# Patient Record
Sex: Female | Born: 1950 | Race: White | Hispanic: No | Marital: Married | Smoking: Never smoker
Health system: Southern US, Community
[De-identification: ages and names within clinical notes are randomized; demographics above are authoritative.]

## PROBLEM LIST (undated history)

## (undated) DIAGNOSIS — F419 Anxiety disorder, unspecified: Secondary | ICD-10-CM

## (undated) DIAGNOSIS — Z9889 Other specified postprocedural states: Secondary | ICD-10-CM

## (undated) DIAGNOSIS — R112 Nausea with vomiting, unspecified: Secondary | ICD-10-CM

## (undated) DIAGNOSIS — I951 Orthostatic hypotension: Secondary | ICD-10-CM

## (undated) DIAGNOSIS — R0602 Shortness of breath: Secondary | ICD-10-CM

## (undated) DIAGNOSIS — K219 Gastro-esophageal reflux disease without esophagitis: Secondary | ICD-10-CM

## (undated) DIAGNOSIS — F32A Depression, unspecified: Secondary | ICD-10-CM

## (undated) DIAGNOSIS — M795 Residual foreign body in soft tissue: Secondary | ICD-10-CM

## (undated) DIAGNOSIS — K5909 Other constipation: Secondary | ICD-10-CM

## (undated) DIAGNOSIS — N200 Calculus of kidney: Secondary | ICD-10-CM

## (undated) DIAGNOSIS — I452 Bifascicular block: Secondary | ICD-10-CM

## (undated) DIAGNOSIS — R51 Headache: Secondary | ICD-10-CM

## (undated) DIAGNOSIS — E278 Other specified disorders of adrenal gland: Secondary | ICD-10-CM

## (undated) DIAGNOSIS — G8929 Other chronic pain: Secondary | ICD-10-CM

## (undated) DIAGNOSIS — F329 Major depressive disorder, single episode, unspecified: Secondary | ICD-10-CM

## (undated) DIAGNOSIS — E1140 Type 2 diabetes mellitus with diabetic neuropathy, unspecified: Secondary | ICD-10-CM

## (undated) DIAGNOSIS — E1143 Type 2 diabetes mellitus with diabetic autonomic (poly)neuropathy: Secondary | ICD-10-CM

## (undated) DIAGNOSIS — T8859XA Other complications of anesthesia, initial encounter: Secondary | ICD-10-CM

## (undated) DIAGNOSIS — E039 Hypothyroidism, unspecified: Secondary | ICD-10-CM

## (undated) DIAGNOSIS — T4145XA Adverse effect of unspecified anesthetic, initial encounter: Secondary | ICD-10-CM

## (undated) HISTORY — DX: Major depressive disorder, single episode, unspecified: F32.9

## (undated) HISTORY — PX: TUBAL LIGATION: SHX77

## (undated) HISTORY — DX: Anxiety disorder, unspecified: F41.9

## (undated) HISTORY — DX: Gastro-esophageal reflux disease without esophagitis: K21.9

## (undated) HISTORY — PX: SHOULDER SURGERY: SHX246

## (undated) HISTORY — DX: Depression, unspecified: F32.A

## (undated) HISTORY — PX: OTHER SURGICAL HISTORY: SHX169

---

## 1999-06-14 HISTORY — PX: HAND SURGERY: SHX662

## 2004-03-04 ENCOUNTER — Ambulatory Visit (HOSPITAL_COMMUNITY): Admission: RE | Admit: 2004-03-04 | Discharge: 2004-03-04 | Payer: Self-pay | Admitting: Preventative Medicine

## 2004-08-18 ENCOUNTER — Ambulatory Visit: Payer: Self-pay | Admitting: Cardiology

## 2004-09-29 ENCOUNTER — Ambulatory Visit: Payer: Self-pay | Admitting: Cardiology

## 2004-10-05 ENCOUNTER — Ambulatory Visit: Payer: Self-pay | Admitting: Cardiology

## 2004-10-05 ENCOUNTER — Inpatient Hospital Stay (HOSPITAL_BASED_OUTPATIENT_CLINIC_OR_DEPARTMENT_OTHER): Admission: RE | Admit: 2004-10-05 | Discharge: 2004-10-05 | Payer: Self-pay | Admitting: Cardiology

## 2004-10-18 ENCOUNTER — Ambulatory Visit: Payer: Self-pay | Admitting: Cardiology

## 2004-10-25 ENCOUNTER — Ambulatory Visit: Payer: Self-pay | Admitting: Internal Medicine

## 2004-11-04 ENCOUNTER — Ambulatory Visit: Payer: Self-pay | Admitting: Cardiology

## 2005-04-11 ENCOUNTER — Ambulatory Visit: Payer: Self-pay | Admitting: Cardiology

## 2005-04-19 ENCOUNTER — Ambulatory Visit: Payer: Self-pay | Admitting: Internal Medicine

## 2006-04-07 ENCOUNTER — Ambulatory Visit: Payer: Self-pay | Admitting: Cardiology

## 2006-10-03 ENCOUNTER — Ambulatory Visit (HOSPITAL_COMMUNITY): Payer: Self-pay | Admitting: Psychiatry

## 2006-10-24 ENCOUNTER — Ambulatory Visit (HOSPITAL_COMMUNITY): Payer: Self-pay | Admitting: Psychiatry

## 2006-11-30 ENCOUNTER — Ambulatory Visit (HOSPITAL_COMMUNITY): Payer: Self-pay | Admitting: Psychiatry

## 2006-12-14 ENCOUNTER — Ambulatory Visit (HOSPITAL_COMMUNITY): Payer: Self-pay | Admitting: Psychiatry

## 2007-03-13 ENCOUNTER — Ambulatory Visit (HOSPITAL_COMMUNITY): Payer: Self-pay | Admitting: Psychiatry

## 2007-04-17 ENCOUNTER — Emergency Department (HOSPITAL_COMMUNITY): Admission: EM | Admit: 2007-04-17 | Discharge: 2007-04-17 | Payer: Self-pay | Admitting: Emergency Medicine

## 2007-05-08 ENCOUNTER — Ambulatory Visit (HOSPITAL_COMMUNITY): Payer: Self-pay | Admitting: Psychiatry

## 2007-06-05 ENCOUNTER — Ambulatory Visit (HOSPITAL_COMMUNITY): Payer: Self-pay | Admitting: Psychiatry

## 2007-07-17 ENCOUNTER — Ambulatory Visit (HOSPITAL_COMMUNITY): Payer: Self-pay | Admitting: Psychiatry

## 2007-08-21 ENCOUNTER — Ambulatory Visit (HOSPITAL_COMMUNITY): Payer: Self-pay | Admitting: Psychiatry

## 2007-10-18 ENCOUNTER — Ambulatory Visit (HOSPITAL_COMMUNITY): Payer: Self-pay | Admitting: Psychiatry

## 2007-12-25 ENCOUNTER — Ambulatory Visit (HOSPITAL_COMMUNITY): Payer: Self-pay | Admitting: Psychiatry

## 2008-04-10 ENCOUNTER — Ambulatory Visit (HOSPITAL_COMMUNITY): Payer: Self-pay | Admitting: Psychiatry

## 2010-10-29 NOTE — Cardiovascular Report (Signed)
Jenna Maldonado, Jenna Maldonado                  ACCOUNT NO.:  [id number]   MEDICAL RECORD NO.:  [id number]          PATIENT TYPE:  OIB   LOCATION:  6501                         FACILITY:  [facility]   PHYSICIAN:  Charlies Constable, M.D. [facility] DATE OF BIRTH:  [birth year]   DATE OF PROCEDURE:  10/05/2004  DATE OF DISCHARGE:                              CARDIAC CATHETERIZATION   PROCEDURE:  Cardiac catheterization.   CARDIOLOGIST:  Charlies Constable, M.D.   INDICATIONS FOR PROCEDURE:  [name] is 59 years old and has a strong  positive family history for coronary heart disease.  She recently developed  chest pain and was evaluated with a Cardiolite scan in [place], which suggested  anteroapical ischemia.  For this reason she was scheduled for evaluation  with a catheterization in the outpatient laboratory by Dr. Willa Rough.   DESCRIPTION OF PROCEDURE:  The procedure was performed via the right femoral  artery and arterial sheath and 4-French preformed coronary catheters.  A  femoral arterial puncture was performed and Omnipaque contrast was used.  A  distal aortogram was performed to rule out renovascular problems for  hypertension.  The patient tolerated the procedure well and left the  laboratory in satisfactory condition.   RESULTS:  1.  LEFT MAIN CORONARY ARTERY:  The left main coronary artery was free of      significant disease.  2.  LEFT ANTERIOR DESCENDING CORONARY ARTERY:  The left anterior descending      coronary artery gave rise to two diagonal branches and three septal      perforators.  The LAD was diffusely irregular and diffusely diseased      with 40%-50% stenosis in the proximal LAD, 50% and 40% stenosis in the      mid-LAD and 70%-80% stenosis in the distal LAD with diffuse disease at      the very distal LAD.  3.  CIRCUMFLEX CORONARY ARTERY:  The circumflex coronary artery gave rise to      a small marginal branch, a large marginal branch and a posterolateral      branch.  These vessels were  free of significant disease.  4.  RIGHT CORONARY ARTERY:  The right coronary artery is a moderate-sized      vessel which gave rise to a conus branch, a right ventricular branch, a      posterior descending branch and two posterolateral branches.  These      vessels were free of significant disease.   LEFT VENTRICULOGRAM:  The left ventriculogram performed in the RAO  projection showed good wall motion, with no areas of hypokinesis.  The  estimated ejection fraction was 60%.   DISTAL AORTOGRAM:  The distal aortogram was performed which showed patent  __________ and no significant aortoiliac obstruction.   PRESSURES:  Aortic pressure:  128/75 with a mean of 98.  Left ventricular pressure:  128/5.   CONCLUSION:  Coronary artery disease with a 50% proximal, 50% mid and 70%-  80% distal stenosis in the left anterior descending coronary artery, no  significant obstruction in the circumflex and right coronary  artery, and  normal left ventricular function.   RECOMMENDATIONS:  The patient has quite a bit of disease in the LAD.  Most  of the disease is distal and not favorable for a percutaneous coronary  intervention.  I think the disease is potentially enough that it could cause  ischemia and could cause an abnormal Cardiolite scan.  I would recommend  medical therapy.      BB/MEDQ  D:  10/05/2004  T:  10/05/2004  Job:  [id number]   cc:   Willa Rough, M.D.   Kirstie Peri, MD  [address]  [place] [address]  Fax: [phone]   Cardiopulmonary Lab  -  [facility]

## 2013-01-01 ENCOUNTER — Ambulatory Visit: Payer: 59 | Admitting: Diagnostic Neuroimaging

## 2013-01-02 ENCOUNTER — Ambulatory Visit (INDEPENDENT_AMBULATORY_CARE_PROVIDER_SITE_OTHER): Payer: Medicare Other | Admitting: Diagnostic Neuroimaging

## 2013-01-02 ENCOUNTER — Other Ambulatory Visit: Payer: Self-pay | Admitting: Diagnostic Neuroimaging

## 2013-01-02 ENCOUNTER — Encounter: Payer: Self-pay | Admitting: Diagnostic Neuroimaging

## 2013-01-02 VITALS — BP 138/65 | HR 67 | Temp 98.3°F | Ht 62.0 in | Wt 203.0 lb

## 2013-01-02 DIAGNOSIS — R6889 Other general symptoms and signs: Secondary | ICD-10-CM

## 2013-01-02 DIAGNOSIS — R51 Headache: Secondary | ICD-10-CM

## 2013-01-02 DIAGNOSIS — E1142 Type 2 diabetes mellitus with diabetic polyneuropathy: Secondary | ICD-10-CM

## 2013-01-02 DIAGNOSIS — E1149 Type 2 diabetes mellitus with other diabetic neurological complication: Secondary | ICD-10-CM

## 2013-01-02 DIAGNOSIS — E1140 Type 2 diabetes mellitus with diabetic neuropathy, unspecified: Secondary | ICD-10-CM | POA: Insufficient documentation

## 2013-01-02 MED ORDER — GABAPENTIN 300 MG PO CAPS: 300.0000 mg | ORAL_CAPSULE | Freq: Every day | ORAL | Status: DC

## 2013-01-02 NOTE — Patient Instructions (Signed)
I will check labs.  Start gabapentin 300mg  at bedtime.

## 2013-01-02 NOTE — Progress Notes (Signed)
[facility]  PATIENT: Jenna Maldonado DOB: [birth year]  REFERRING CLINICIAN: Sherryll Burger HISTORY FROM: patient REASON FOR VISIT: new consult   HISTORICAL  CHIEF COMPLAINT:  Chief Complaint  Patient presents with  . Neurologic Problem    Np#6    HISTORY OF PRESENT ILLNESS:  62 year old right-handed female with diabetes, depression, here for evaluation of right-sided facial pain.  For past one year patient has right maxillary, periorbital burning pain, sometimes dull and nagging. Sometimes she has numbness in her whole head. Sometimes she has humming/buzzing sensation in her ears. She does to wake up in the morning with nausea and more pain which subsides throughout the day. She's not tried any medications for this. No over-the-counter medications.  Patient had MRI of the trigeminal nerve protocol, which showed an incidental right parotid lymphatic structure but no definite trigeminal nerve pathology.  REVIEW OF SYSTEMS: Full 14 system review of systems performed and notable only for weight gain hearing loss ringing in ears rash moles itching urination problems aching muscle cramps joint swelling joint pain feeling hot easy bruising numbness weakness difficulty swallowing depression sleepiness restless legs.  ALLERGIES: Allergies  Allergen Reactions  . Codeine     HOME MEDICATIONS: No outpatient prescriptions prior to visit.   No facility-administered medications prior to visit.    PAST MEDICAL HISTORY: Past Medical History  Diagnosis Date  . Diabetes mellitus without complication   . Depression     PAST SURGICAL HISTORY: Past Surgical History  Procedure Laterality Date  . Shoulder surgery Left   . Tubal ligation    . Hand surgery Bilateral 2001  . Eye surgrey Bilateral     4-5 times     FAMILY HISTORY: Family History  Problem Relation Age of Onset  . Cancer Mother     SOCIAL HISTORY:  History   Social History  . Marital Status: Married   Spouse Name: N/A    Number of Children: 2  . Years of Education: 12   Occupational History  . N/A    Social History Main Topics  . Smoking status: Never Smoker   . Smokeless tobacco: Not on file  . Alcohol Use: Not on file  . Drug Use: Not on file  . Sexually Active: Not on file   Other Topics Concern  . Not on file   Social History Narrative  . No narrative on file     PHYSICAL EXAM  Filed Vitals:   01/02/13 0950  BP: 138/65  Pulse: 67  Temp: 98.3 F (36.8 C)  TempSrc: Oral  Height: 5\' 2"  (1.575 m)  Weight: 203 lb (92.08 kg)    Not recorded    Body mass index is 37.12 kg/(m^2).  GENERAL EXAM: Patient is in no distress  CARDIOVASCULAR: Regular rate and rhythm, no murmurs, no carotid bruits  NEUROLOGIC: MENTAL STATUS: awake, alert, language fluent, comprehension intact, naming intact CRANIAL NERVE: no papilledema on fundoscopic exam, pupils equal and reactive to light, visual fields full to confrontation, extraocular muscles intact, no nystagmus, facial sensation and strength symmetric, uvula midline, shoulder shrug symmetric, tongue midline. MOTOR: normal bulk and tone, full strength in the BUE, BLE SENSORY: DECR PP IN TOES. TRACE VIB AT TOES. COORDINATION: finger-nose-finger, fine finger movements normal REFLEXES: BUE TRACE, KNEES TRACE, ANKLES 0. GAIT/STATION: narrow based gait; DIFF WITH TANDEM. romberg is negative   DIAGNOSTIC DATA (LABS, IMAGING, TESTING) - I reviewed patient records, labs, notes, testing and imaging myself where available.  No results found for this  basename: WBC, HGB, HCT, MCV, PLT   No results found for this basename: na, k, cl, co2, glucose, bun, creatinine, calcium, prot, albumin, ast, alt, alkphos, bilitot, gfrnonaa, gfraa   No results found for this basename: CHOL, HDL, LDLCALC, LDLDIRECT, TRIG, CHOLHDL   No results found for this basename: HGBA1C   No results found for this basename: VITAMINB12   No results found for  this basename: TSH    11/15/12 MRI TRIGEMINAL - Basilar artery is midline without compression of the cisternal aspect of the fifth cranial nerve. Symmetric normal appearance of the course of the fifth cranial nerve without mass or abnormal enhancement identified. Along the inferior medial aspect of the right parotid gland, there is a nonspecific T2 hyperintense non-enhancing 1.6 x 1.1 x 1.4 cm lesion. Etiology of this findings indeterminate. 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    ASSESSMENT AND PLAN  62 y.o. year old female  has a past medical history of Diabetes mellitus without complication and Depression. here with atypical right facial pain, could be related to idiopathic trigeminal neuralgia, diabetes, migraine phenomenon, temporal arteritis. MRI unremarkable re: her symptoms.  Plan: 1. Labs 2. Gabapentin 300mg  qhs   Orders Placed This Encounter  Procedures  . Sedimentation Rate  . C-reactive Protein  . TSH  . Vitamin B12     Suanne Marker, MD 01/02/2013, 10:28 AM Certified in Neurology, Neurophysiology and Neuroimaging  [facility] [address] [place], [place] [address] [phone]

## 2013-01-03 LAB — C-REACTIVE PROTEIN: CRP: 2 mg/L (ref 0.0–4.9)

## 2013-01-03 LAB — TSH: TSH: 5.65 u[IU]/mL — ABNORMAL HIGH (ref 0.450–4.500)

## 2013-01-07 ENCOUNTER — Telehealth: Payer: Self-pay | Admitting: *Deleted

## 2013-01-07 NOTE — Telephone Encounter (Signed)
Returned pt's call-back - left voice message requesting a call-back. 2nd attempt.

## 2013-01-07 NOTE — Telephone Encounter (Signed)
Message copied by Avie Echevaria on Mon Jan 07, 2013  2:09 PM ------      Message from: Joycelyn Schmid      Created: Mon Jan 07, 2013  1:37 PM       pls call pt with results. Elevated TSH needs PCP follow up.            -VRP            ----- Message -----         From: [facility] Lab Results In Interface         Sent: 01/03/2013   4:39 PM           To: Suanne Marker, MD                   ------

## 2013-01-07 NOTE — Telephone Encounter (Signed)
Voice message left requesting a call-back. 

## 2013-01-08 NOTE — Telephone Encounter (Signed)
Spoke with patient and relayed results of blood work; I also relayed Dr [employer] advice to talk to her PCP regarding her TSH level.  Patient understood and had no questions.

## 2013-01-08 NOTE — Progress Notes (Signed)
Quick Note:  Spoke with patient and relayed results of blood work and Dr [employer] advice to follow-up with her PCP. Patient understood and had no questions.  ______

## 2013-04-09 ENCOUNTER — Ambulatory Visit: Payer: Medicare Other | Admitting: Diagnostic Neuroimaging

## 2013-04-18 ENCOUNTER — Ambulatory Visit (INDEPENDENT_AMBULATORY_CARE_PROVIDER_SITE_OTHER): Payer: Medicare Other | Admitting: Diagnostic Neuroimaging

## 2013-04-18 ENCOUNTER — Encounter: Payer: Self-pay | Admitting: Diagnostic Neuroimaging

## 2013-04-18 VITALS — BP 134/71 | HR 87 | Temp 98.3°F | Ht 63.0 in | Wt 202.5 lb

## 2013-04-18 DIAGNOSIS — R51 Headache: Secondary | ICD-10-CM

## 2013-04-18 DIAGNOSIS — E1140 Type 2 diabetes mellitus with diabetic neuropathy, unspecified: Secondary | ICD-10-CM

## 2013-04-18 DIAGNOSIS — R6889 Other general symptoms and signs: Secondary | ICD-10-CM

## 2013-04-18 DIAGNOSIS — E1149 Type 2 diabetes mellitus with other diabetic neurological complication: Secondary | ICD-10-CM

## 2013-04-18 DIAGNOSIS — E1142 Type 2 diabetes mellitus with diabetic polyneuropathy: Secondary | ICD-10-CM

## 2013-04-18 MED ORDER — GABAPENTIN 300 MG PO CAPS: 300.0000 mg | ORAL_CAPSULE | Freq: Three times a day (TID) | ORAL | Status: DC

## 2013-04-18 NOTE — Progress Notes (Signed)
[facility]  PATIENT: Jenna Maldonado DOB: 01/14/1951  REFERRING CLINICIAN: Sherryll Burger HISTORY FROM: patient REASON FOR VISIT: new consult   HISTORICAL  CHIEF COMPLAINT:  Chief Complaint  Patient presents with  . Follow-up    R. Facial pain, diabetic neuropathy    HISTORY OF PRESENT ILLNESS:   UPDATE 04/18/13: Since last visit, on gabapentin 300mg  qhs, with mild benefit, and no side effects. Still with right temporal, right maxillary pressure / achy pain.  PRIOR HPI (01/02/13): 63 year old right-handed female with diabetes, depression, here for evaluation of right-sided facial pain.  For past one year patient has right maxillary, periorbital burning pain, sometimes dull and nagging. Sometimes she has numbness in her whole head. Sometimes she has humming/buzzing sensation in her ears. She does to wake up in the morning with nausea and more pain which subsides throughout the day. She's not tried any medications for this. No over-the-counter medications.  Patient had MRI of the trigeminal nerve protocol, which showed an incidental right parotid lymphatic structure but no definite trigeminal nerve pathology.  REVIEW OF SYSTEMS: Full 14 system review of systems performed and notable only for fatigue ringing in ears trouble swallowing snoring shortness of breath eye pain feeling hot joint pain joint swelling frequent infection runny nose racing thoughts depression dizziness difficulty swallowing weakness headache memory loss chest pain swelling in legs    ALLERGIES: Allergies  Allergen Reactions  . Codeine     HOME MEDICATIONS: Outpatient Prescriptions Prior to Visit  Medication Sig Dispense Refill  . Enoxaparin Sodium (LOVENOX IJ) Inject 50 Units as directed daily.      [name] FLUoxetine (PROZAC) 40 MG capsule       . glimepiride (AMARYL) 4 MG tablet Take 4 mg by mouth 2 (two) times daily.      [name] levothyroxine (SYNTHROID, LEVOTHROID) 25 MCG tablet       . gabapentin  (NEURONTIN) 300 MG capsule Take 1 capsule (300 mg total) by mouth at bedtime.  30 capsule  6  . omeprazole (PRILOSEC) 20 MG capsule        No facility-administered medications prior to visit.    PAST MEDICAL HISTORY: Past Medical History  Diagnosis Date  . Diabetes mellitus without complication   . Depression     PAST SURGICAL HISTORY: Past Surgical History  Procedure Laterality Date  . Shoulder surgery Left   . Tubal ligation    . Hand surgery Bilateral 2001  . Eye surgrey Bilateral     4-5 times     FAMILY HISTORY: Family History  Problem Relation Age of Onset  . Cancer Mother   . Suicidality Father     GSW    SOCIAL HISTORY:  History   Social History  . Marital Status: Married    Spouse Name: Chanetta Marshall    Number of Children: 2  . Years of Education: 12   Occupational History  . N/A    Social History Main Topics  . Smoking status: Never Smoker   . Smokeless tobacco: Never Used  . Alcohol Use: No  . Drug Use: No  . Sexual Activity: Not on file   Other Topics Concern  . Not on file   Social History Narrative   Patient lives at home alone.   Caffeine Use: occasionally     PHYSICAL EXAM  Filed Vitals:   04/18/13 1527  BP: 134/71  Pulse: 87  Temp: 98.3 F (36.8 C)  TempSrc: Oral  Height: 5\' 3"  (1.6 m)  Weight:  202 lb 8 oz (91.853 kg)    Not recorded    Body mass index is 35.88 kg/(m^2).  GENERAL EXAM: Patient is in no distress  CARDIOVASCULAR: Regular rate and rhythm, no murmurs, no carotid bruits  NEUROLOGIC: MENTAL STATUS: awake, alert, language fluent, comprehension intact, naming intact CRANIAL NERVE: pupils equal and reactive to light, visual fields full to confrontation, extraocular muscles intact, no nystagmus, facial sensation and strength symmetric, uvula midline, shoulder shrug symmetric, tongue midline. MOTOR: normal bulk and tone, full strength in the BUE, BLE SENSORY: DECR PP IN TOES. TRACE VIB AT TOES. COORDINATION:  finger-nose-finger, fine finger movements normal REFLEXES: BUE TRACE, KNEES TRACE, ANKLES 0. GAIT/STATION: narrow based gait; DIFF WITH TANDEM. romberg is negative   DIAGNOSTIC DATA (LABS, IMAGING, TESTING) - I reviewed patient records, labs, notes, testing and imaging myself where available.  No results found for this basename: WBC,  HGB,  HCT,  MCV,  PLT   No results found for this basename: na,  k,  cl,  co2,  glucose,  bun,  creatinine,  calcium,  prot,  albumin,  ast,  alt,  alkphos,  bilitot,  gfrnonaa,  gfraa   No results found for this basename: CHOL,  HDL,  LDLCALC,  LDLDIRECT,  TRIG,  CHOLHDL   No results found for this basename: HGBA1C   Lab Results  Component Value Date   VITAMINB12 316 01/02/2013   Lab Results  Component Value Date   TSH 5.650* 01/02/2013   Lab Results  Component Value Date   CRP 2.0 01/02/2013   Lab Results  Component Value Date   ESRSEDRATE 7 01/02/2013    11/15/12 MRI TRIGEMINAL - Basilar artery is midline without compression of the cisternal aspect of the fifth cranial nerve. Symmetric normal appearance of the course of the fifth cranial nerve without mass or abnormal enhancement identified. Along the inferior medial aspect of the right parotid gland, there is a nonspecific T2 hyperintense non-enhancing 1.6 x 1.1 x 1.4 cm lesion. Etiology of this findings indeterminate. 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   ASSESSMENT AND PLAN  62 y.o. year old female  has a past medical history of Diabetes mellitus without complication and Depression. here with atypical right facial pain, could be related to idiopathic trigeminal neuralgia, diabetes, migraine phenomenon. MRI unremarkable re: her symptoms.  Plan: 1. Increase gabapentin to 300mg  BID then TID.  Return in about 3 months (around 07/19/2013) for with Edison Nasuti, MD 04/18/2013, 4:03 PM Certified in Neurology,  Neurophysiology and Neuroimaging  [facility] [place] [facility] [address], [address] [place], [place] [address] [phone]

## 2013-04-18 NOTE — Patient Instructions (Signed)
Increase gabapentin to 300mg  twice a day x 2 weeks, then up to three times per day.  Monitor for leg swelling or dizziness.

## 2013-07-19 ENCOUNTER — Ambulatory Visit (INDEPENDENT_AMBULATORY_CARE_PROVIDER_SITE_OTHER): Payer: Medicare Other | Admitting: Nurse Practitioner

## 2013-07-19 ENCOUNTER — Encounter: Payer: Self-pay | Admitting: Nurse Practitioner

## 2013-07-19 ENCOUNTER — Encounter (INDEPENDENT_AMBULATORY_CARE_PROVIDER_SITE_OTHER): Payer: Self-pay

## 2013-07-19 VITALS — BP 108/71 | HR 72 | Ht 62.0 in | Wt 202.0 lb

## 2013-07-19 DIAGNOSIS — E1149 Type 2 diabetes mellitus with other diabetic neurological complication: Secondary | ICD-10-CM

## 2013-07-19 DIAGNOSIS — E1142 Type 2 diabetes mellitus with diabetic polyneuropathy: Secondary | ICD-10-CM

## 2013-07-19 DIAGNOSIS — E1140 Type 2 diabetes mellitus with diabetic neuropathy, unspecified: Secondary | ICD-10-CM

## 2013-07-19 DIAGNOSIS — R51 Headache: Secondary | ICD-10-CM

## 2013-07-19 DIAGNOSIS — R519 Headache, unspecified: Secondary | ICD-10-CM

## 2013-07-19 MED ORDER — CARBAMAZEPINE ER 100 MG PO CP12: 100.0000 mg | ORAL_CAPSULE | Freq: Two times a day (BID) | ORAL | Status: DC

## 2013-07-19 NOTE — Patient Instructions (Signed)
Continue Gabapentin 300 mg tid as needed for nerve pain.  Trial of Carbamazepine 100 mg every 12 hours for facial pain.  Follow up in 2-3 months.  Carbamazepine extended-release capsules What is this medicine? CARBAMAZEPINE (kar ba MAZ e peen) is used to control seizures caused by certain types of epilepsy. This medicine is also used to treat nerve related pain. It is not for common aches and pains. This medicine may be used for other purposes; ask your health care provider or pharmacist if you have questions. COMMON BRAND NAME(S): Carbatrol What should I tell my health care provider before I take this medicine? They need to know if you have any of these conditions: -Asian ancestry -bone marrow disease -glaucoma -heart disease or irregular heartbeat -kidney disease -liver disease -low blood counts, like low white cell, platelet, or red cell counts -porphyria -psychotic disorders -suicidal thoughts, plans, or attempt; a previous suicide attempt by you or a family member -an unusual or allergic reaction to carbamazepine, tricyclic antidepressants, phenytoin, phenobarbital or other medicines, foods, dyes, or preservatives -pregnant or trying to get pregnant -breast-feeding How should I use this medicine? Take this medicine by mouth with a glass of water. Follow the directions on the prescription label. Do not cut, crush or chew this medicine. Take this medicine with or without food. The capsules can be opened and the beads sprinkled over food such as applesauce or other similar food product. Take your doses at regular intervals. Do not take your medicine more often than directed. Do not stop taking except on your doctor's advice. A special MedGuide will be given to you by the pharmacist with each prescription and refill. Be sure to read this information carefully each time. Talk to your pediatrician regarding the use of this medicine in children. While this drug may be prescribed for  selected conditions, precautions do apply. Overdosage: If you think you have taken too much of this medicine contact a poison control center or emergency room at once. NOTE: This medicine is only for you. Do not share this medicine with others. What if I miss a dose? If you miss a dose, take it as soon as you can. If it is almost time for your next dose, take only that dose. Do not take double or extra doses. What may interact with this medicine? Do not take this medicine with any of the following medications: -delavirdine -MAOIs like Carbex, Eldepryl, Marplan, Nardil, and Parnate -nefazodone -oxcarbazepine This medicine may also interact with the following medications: -acetaminophen -acetazolamide -barbiturate medicines for inducing sleep or treating seizures, like phenobarbital -certain antibiotics like clarithromycin, erythromycin or troleandomycin -cimetidine -cyclosporine -danazol -dicumarol -doxycycline -female hormones, including estrogens and birth control pills -grapefruit juice -isoniazid, INH -levothyroxine and other thyroid hormones -lithium and other medicines to treat mood problems or psychotic disturbances -loratadine -medicines for angina or high blood pressure -medicines for cancer -medicines for depression or anxiety -medicines for sleep -medicines to treat fungal infections, like fluconazole, itraconazole or ketoconazole -medicines used to treat HIV infection or AIDS -methadone -niacinamide -praziquantel -propoxyphene -rifampin or rifabutin -seizure or epilepsy medicine -steroid medicines such as prednisone or cortisone -theophylline -tramadol -warfarin This list may not describe all possible interactions. Give your health care provider a list of all the medicines, herbs, non-prescription drugs, or dietary supplements you use. Also tell them if you smoke, drink alcohol, or use illegal drugs. Some items may interact with your medicine. What should I  watch for while using this medicine? Visit your doctor or  health care professional for a regular check on your progress. Do not change brands or dosage forms of this medicine without discussing the change with your doctor or health care professional. If you are taking this medicine for epilepsy (seizures) do not stop taking it suddenly. This increases the risk of seizures. Wear a Probation officer or necklace. Carry an identification card with information about your condition, medications, and doctor or health care professional. Dennis Bast may get drowsy, dizzy, or have blurred vision. Do not drive, use machinery, or do anything that needs mental alertness until you know how this medicine affects you. To reduce dizzy or fainting spells, do not sit or stand up quickly, especially if you are an older patient. Alcohol can increase drowsiness and dizziness. Avoid alcoholic drinks. Birth control pills may not work properly while you are taking this medicine. Talk to your doctor about using an extra method of birth control. This medicine can make you more sensitive to the sun. Keep out of the sun. If you cannot avoid being in the sun, wear protective clothing and use sunscreen. Do not use sun lamps or tanning beds/booths. The use of this medicine may increase the chance of suicidal thoughts or actions. Pay special attention to how you are responding while on this medicine. Any worsening of mood, or thoughts of suicide or dying should be reported to your health care professional right away. Women who become pregnant while using this medicine may enroll in the [address] Pregnancy Registry by calling [phone]. This registry collects information about the safety of antiepileptic drug use during pregnancy. What side effects may I notice from receiving this medicine? Side effects that you should report to your doctor or health care professional as soon as possible: -allergic reactions like  skin rash, itching or hives, swelling of the face, lips, or tongue -breathing problems -changes in vision -confusion -dark urine -fast or irregular heartbeat -fever or chills, sore throat -mouth ulcers -pain or difficulty passing urine -redness, blistering, peeling or loosening of the skin, including inside the mouth -ringing in the ears -seizures -stomach pain -swollen joints or muscle/joint aches and pains -unusual bleeding or bruising -unusually weak or tired -vomiting -worsening of mood, thoughts or actions of suicide or dying -yellowing of the eyes or skin Side effects that usually do not require medical attention (report to your doctor or health care professional if they continue or are bothersome): -clumsiness or unsteadiness -diarrhea or constipation -headache -increased sweating -nausea This list may not describe all possible side effects. Call your doctor for medical advice about side effects. You may report side effects to FDA at 1-800-FDA-1088. Where should I keep my medicine? Keep out of reach of children. Store at room temperature between 15 and 30 degrees C (59 and 86 degrees F). Keep container tightly closed. Protect from moisture. Throw away any unused medicine after the expiration date. NOTE: This sheet is a summary. It may not cover all possible information. If you have questions about this medicine, talk to your doctor, pharmacist, or health care provider.  2014, Elsevier/Gold Standard. (2012-08-07 15:27:59)

## 2013-07-19 NOTE — Progress Notes (Signed)
PATIENT: Jenna Maldonado DOB: 1951/06/13   REASON FOR VISIT: follow up for diabetic neuropathy HISTORY FROM: patient  HISTORY OF PRESENT ILLNESS: UPDATE 07/19/13 (LL):  Since last visit, on gabapentin 300mg  BID, with moderate benefit, and no side effects. She states she has more feeling in her feet.  The facial pain is some better, but still bothersome.  Still with right temporal, right maxillary pressure / achy pain, worse at night.  UPDATE 04/18/13: Since last visit, on gabapentin 300mg  qhs, with mild benefit, and no side effects. Still with right temporal, right maxillary pressure / achy pain.   PRIOR HPI (01/02/13): 63 year old right-handed female with diabetes, depression, here for evaluation of right-sided facial pain.  For past one year patient has right maxillary, periorbital burning pain, sometimes dull and nagging. Sometimes she has numbness in her whole head. Sometimes she has humming/buzzing sensation in her ears. She does to wake up in the morning with nausea and more pain which subsides throughout the day. She's not tried any medications for this. No over-the-counter medications.  Patient had MRI of the trigeminal nerve protocol, which showed an incidental right parotid lymphatic structure but no definite trigeminal nerve pathology.   REVIEW OF SYSTEMS: Full 14 system review of systems performed and notable only for ringing in ears eye pain feeling hot depression headache, neck pain, runny nose. [name]  ALLERGIES: Allergies  Allergen Reactions  . Codeine     HOME MEDICATIONS: Outpatient Prescriptions Prior to Visit  Medication Sig Dispense Refill  . ACCU-CHEK SMARTVIEW test strip       . FLUoxetine (PROZAC) 40 MG capsule       . gabapentin (NEURONTIN) 300 MG capsule Take 1 capsule (300 mg total) by mouth 3 (three) times daily.  90 capsule  6  . glimepiride (AMARYL) 4 MG tablet Take 4 mg by mouth 2 (two) times daily.      [name] LEVEMIR FLEXPEN 100 UNIT/ML SOPN Inject 45 Units as  directed daily.      [name] levothyroxine (SYNTHROID, LEVOTHROID) 25 MCG tablet       . omeprazole (PRILOSEC) 20 MG capsule       . Enoxaparin Sodium (LOVENOX IJ) Inject 50 Units as directed daily.      . nitrofurantoin, macrocrystal-monohydrate, (MACROBID) 100 MG capsule Take 1 capsule by mouth as needed.      . ondansetron (ZOFRAN) 4 MG tablet as needed.      . tobramycin (TOBREX) 0.3 % ophthalmic solution as needed.       . metFORMIN (GLUCOPHAGE) 500 MG tablet    Sig: Take 500 mg by mouth daily.  . Aflibercept (EYLEA) 2 MG/0.05ML SOLN    Sig: Inject into the eye.    PAST MEDICAL HISTORY: Past Medical History  Diagnosis Date  . Diabetes mellitus without complication   . Depression     PAST SURGICAL HISTORY: Past Surgical History  Procedure Laterality Date  . Shoulder surgery Left   . Tubal ligation    . Hand surgery Bilateral 2001  . Eye surgrey Bilateral     4-5 times     FAMILY HISTORY: Family History  Problem Relation Age of Onset  . Cancer Mother   . Suicidality Father     GSW    SOCIAL HISTORY: History   Social History  . Marital Status: Married    Spouse Name: Laverna Peace    Number of Children: 2  . Years of Education: 12   Occupational History  . N/A  Social History Main Topics  . Smoking status: Never Smoker   . Smokeless tobacco: Never Used  . Alcohol Use: No  . Drug Use: No  . Sexual Activity: Not on file   Other Topics Concern  . Not on file   Social History Narrative   Patient lives at home alone.   Caffeine Use: occasionally     PHYSICAL EXAM  Filed Vitals:   07/19/13 1124  BP: 108/71  Pulse: 72  Height: 5\' 2"  (1.575 m)  Weight: 202 lb (91.627 kg)   Body mass index is 36.94 kg/(m^2).  Generalized: Well developed, in no acute distress  Head: normocephalic and atraumatic. Oropharynx benign  Neck: Supple, no carotid bruits  Cardiac: Regular rate rhythm, no murmur  Musculoskeletal: No deformity   Neurological examination  MENTAL  STATUS: awake, alert, language fluent, comprehension intact, naming intact  CRANIAL NERVE: pupils equal and reactive to light, visual fields full to confrontation, extraocular muscles intact, no nystagmus, facial sensation and strength symmetric, uvula midline, shoulder shrug symmetric, tongue midline.  MOTOR: normal bulk and tone, full strength in the BUE, BLE  SENSORY: DECR PP IN TOES. TRACE VIB AT TOES.  COORDINATION: finger-nose-finger, fine finger movements normal  REFLEXES: BUE TRACE, KNEES TRACE, ANKLES 0.  GAIT/STATION: narrow based gait; DIFF WITH TANDEM. romberg is negative  DIAGNOSTIC DATA (LABS, IMAGING, TESTING) - I reviewed patient records, labs, notes, testing and imaging myself where available.  No results found for this basename: WBC,  HGB,  HCT,  MCV,  PLT   No results found for this basename: na,  k,  cl,  co2,  glucose,  bun,  creatinine,  calcium,  prot,  albumin,  ast,  alt,  alkphos,  bilitot,  gfrnonaa,  gfraa   No results found for this basename: CHOL,  HDL,  LDLCALC,  LDLDIRECT,  TRIG,  CHOLHDL   No results found for this basename: HGBA1C   Lab Results  Component Value Date   VITAMINB12 316 01/02/2013   Lab Results  Component Value Date   TSH 5.650* 01/02/2013   Lab Results  Component Value Date   ESRSEDRATE 7 01/02/2013    11/15/12 MRI TRIGEMINAL - Basilar artery is midline without compression of the cisternal aspect of the fifth cranial nerve. Symmetric normal appearance of the course of the fifth cranial nerve without mass or abnormal enhancement identified. Along the inferior medial aspect of the right parotid gland, there is a nonspecific T2 hyperintense non-enhancing 1.6 x 1.1 x 1.4 cm lesion. Etiology of this findings indeterminate. 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  ASSESSMENT AND PLAN 63 y.o. year old female has a past medical history of Diabetes mellitus without complication and  Depression here with atypical right facial pain, could be related to idiopathic trigeminal neuralgia, diabetes, migraine phenomenon. MRI unremarkable re: her symptoms.   Plan:  1. Continue gabapentin 300mg  BID for neuropathy. 2. Trial of Carbamazepine, 100 mg BID to see if it helps with facial pain. 3. Recommended B12 oral supplement, last B12 level was low in July at 316, will recheck at next visit. 4. Follow up in 2-3 months, sooner as needed.   Meds ordered this encounter  Medications  . carbamazepine (CARBATROL) 100 MG 12 hr capsule    Sig: Take 1 capsule (100 mg total) by mouth 2 (two) times daily.    Dispense:  60 capsule    Refill:  2    Order Specific Question:  Supervising  Provider    Answer:  Penni Bombard [[id number]]   Return in about 3 months (around 10/16/2013).  Philmore Pali, MSN, NP-C 07/19/2013, 12:33 PM [facility] [address], [place], [place] [address] [phone]  Note: This document was prepared with digital dictation and possible smart phrase technology. Any transcriptional errors that result from this process are unintentional.

## 2013-08-06 ENCOUNTER — Encounter: Payer: Self-pay | Admitting: Nurse Practitioner

## 2013-10-17 ENCOUNTER — Encounter (INDEPENDENT_AMBULATORY_CARE_PROVIDER_SITE_OTHER): Payer: Self-pay

## 2013-10-17 ENCOUNTER — Encounter: Payer: Self-pay | Admitting: Nurse Practitioner

## 2013-10-17 ENCOUNTER — Ambulatory Visit (INDEPENDENT_AMBULATORY_CARE_PROVIDER_SITE_OTHER): Payer: Medicare Other | Admitting: Nurse Practitioner

## 2013-10-17 VITALS — BP 124/70 | HR 91 | Ht 62.5 in | Wt 208.0 lb

## 2013-10-17 DIAGNOSIS — R221 Localized swelling, mass and lump, neck: Secondary | ICD-10-CM

## 2013-10-17 DIAGNOSIS — K118 Other diseases of salivary glands: Secondary | ICD-10-CM

## 2013-10-17 DIAGNOSIS — G609 Hereditary and idiopathic neuropathy, unspecified: Secondary | ICD-10-CM

## 2013-10-17 DIAGNOSIS — E1140 Type 2 diabetes mellitus with diabetic neuropathy, unspecified: Secondary | ICD-10-CM

## 2013-10-17 DIAGNOSIS — R519 Headache, unspecified: Secondary | ICD-10-CM

## 2013-10-17 DIAGNOSIS — R51 Headache: Secondary | ICD-10-CM

## 2013-10-17 DIAGNOSIS — R6889 Other general symptoms and signs: Secondary | ICD-10-CM

## 2013-10-17 DIAGNOSIS — R220 Localized swelling, mass and lump, head: Secondary | ICD-10-CM

## 2013-10-17 DIAGNOSIS — E1149 Type 2 diabetes mellitus with other diabetic neurological complication: Secondary | ICD-10-CM

## 2013-10-17 DIAGNOSIS — E1142 Type 2 diabetes mellitus with diabetic polyneuropathy: Secondary | ICD-10-CM

## 2013-10-17 MED ORDER — GABAPENTIN 300 MG PO CAPS: 600.0000 mg | ORAL_CAPSULE | Freq: Three times a day (TID) | ORAL | Status: DC

## 2013-10-17 MED ORDER — CARBAMAZEPINE ER 100 MG PO CP12: 200.0000 mg | ORAL_CAPSULE | Freq: Two times a day (BID) | ORAL | Status: DC

## 2013-10-17 NOTE — Patient Instructions (Addendum)
Increase Carbamazepine to 2 capsules BID. Increase gabapentin to 2 capsules tid if needed for pain. Repeat MRI brain with trigeminal protocol, follow up after scan.

## 2013-10-17 NOTE — Progress Notes (Signed)
PATIENT: Jenna Maldonado DOB: [birth year]  REASON FOR VISIT: follow up for facial pain, neuropathy HISTORY FROM: patient  HISTORY OF PRESENT ILLNESS: UPDATE 10/17/13 (LL): Since last visit, no change in facial pain with adding Carbamazepine.  Pain still present every day, especially in the morning.  She thinks there is swelling on the right side of her face in the temporal-cheek area. She feels pressure in her right ear, sometimes feels like fluid in the ear.  UPDATE 07/19/13 (LL): Since last visit, on gabapentin 300mg  BID, with moderate benefit, but Gabapentin makes her sleepy. She states she has more feeling in her feet. The facial pain is some better, but still bothersome. Still with right temporal, right maxillary pressure / achy pain, worse first thing in the morning.    UPDATE 04/18/61 (VP): Since last visit, on gabapentin 300mg  qhs, with mild benefit, and no side effects. Still with right temporal, right maxillary pressure / achy pain.   PRIOR HPI (01/02/13): 63 year old right-handed female with diabetes, depression, here for evaluation of right-sided facial pain.  For past one year patient has right maxillary, periorbital burning pain, sometimes dull and nagging. Sometimes she has numbness in her whole head. Sometimes she has humming/buzzing sensation in her ears. She does to wake up in the morning with nausea and more pain which subsides throughout the day. She's not tried any medications for this. No over-the-counter medications.  Patient had MRI of the trigeminal nerve protocol, which showed an incidental right parotid lymphatic structure but no definite trigeminal nerve pathology.   REVIEW OF SYSTEMS: Full 14 system review of systems performed and notable only for ringing in ears eye pain feeling hot depression headache, neck pain, runny nose. [name]   ALLERGIES: Allergies  Allergen Reactions  . Codeine     HOME MEDICATIONS: Outpatient Prescriptions Prior to Visit  Medication Sig  Dispense Refill  . ACCU-CHEK SMARTVIEW test strip       . FLUoxetine (PROZAC) 40 MG capsule       . glimepiride (AMARYL) 4 MG tablet Take 4 mg by mouth 2 (two) times daily.      [name] LEVEMIR FLEXPEN 100 UNIT/ML SOPN Inject 45 Units as directed daily.      [name] levothyroxine (SYNTHROID, LEVOTHROID) 25 MCG tablet       . metFORMIN (GLUCOPHAGE) 500 MG tablet Take 500 mg by mouth daily.      [name] omeprazole (PRILOSEC) 20 MG capsule       . carbamazepine (CARBATROL) 100 MG 12 hr capsule Take 1 capsule (100 mg total) by mouth 2 (two) times daily.  60 capsule  2  . gabapentin (NEURONTIN) 300 MG capsule Take 1 capsule (300 mg total) by mouth 3 (three) times daily.  90 capsule  6  . Aflibercept (EYLEA) 2 MG/0.05ML SOLN Inject into the eye.       No facility-administered medications prior to visit.     PHYSICAL EXAM  Filed Vitals:   10/17/13 1044  BP: 124/70  Pulse: 91  Height: 5' 2.5" (1.588 m)  Weight: 208 lb (94.348 kg)   Body mass index is 37.41 kg/(m^2).  Generalized: Well developed, in no acute distress  Head: normocephalic and atraumatic. Oropharynx benign. Minor soft tissue swelling in right lateral temporal-cheek area Ears: bilateral TM's clear with good light reflex, no effusion Neck: Supple, no carotid bruits  Cardiac: Regular rate rhythm, no murmur  Musculoskeletal: No deformity   Neurological examination  MENTAL STATUS: awake, alert, language fluent, comprehension intact, naming  intact  CRANIAL NERVE: pupils equal and reactive to light, visual fields full to confrontation, extraocular muscles intact, no nystagmus, facial sensation and strength symmetric, uvula midline, shoulder shrug symmetric, tongue midline.  MOTOR: normal bulk and tone, full strength in the BUE, BLE  SENSORY: DECR PP IN TOES. TRACE VIB AT TOES.  COORDINATION: finger-nose-finger, fine finger movements normal  REFLEXES: BUE TRACE, KNEES TRACE, ANKLES 0.  GAIT/STATION: narrow based gait; DIFF WITH TANDEM. romberg  is negative  11/15/12 MRI TRIGEMINAL - Basilar artery is midline without compression of the cisternal aspect of the fifth cranial nerve. Symmetric normal appearance of the course of the fifth cranial nerve without mass or abnormal enhancement identified. Along the inferior medial aspect of the right parotid gland, there is a nonspecific T2 hyperintense non-enhancing 1.6 x 1.1 x 1.4 cm lesion. Etiology of this findings indeterminate. 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  (Scan completed at [facility]).  ASSESSMENT AND PLAN 63 y.o. year old Caucasian female has a past medical history of Diabetes mellitus without complication and Depression here with atypical right facial pain, could be related to idiopathic trigeminal neuralgia, diabetes, migraine phenomenon. 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  Facial pain is unchanged after adding low dose Carbamazepine.  Plan:  Increase Carbamazepine to 200 mg - 2 capsules BID. Increase gabapentin to 2 capsules tid if needed for pain. Repeat MRI brain with trigeminal protocol. Follow up after scan.  Orders Placed This Encounter  Procedures  . MR Brain W Wo Contrast   Meds ordered this encounter  Medications  . gabapentin (NEURONTIN) 300 MG capsule    Sig: Take 2 capsules (600 mg total) by mouth 3 (three) times daily.    Dispense:  180 capsule    Refill:  6    Order Specific Question:  Supervising Provider    Answer:  Andrey Spearman R [[id number]]  . carbamazepine (CARBATROL) 100 MG 12 hr capsule    Sig: Take 2 capsules (200 mg total) by mouth 2 (two) times daily.    Dispense:  120 capsule    Refill:  2    Order Specific Question:  Supervising Provider    Answer:  Andrey Spearman R [[id number]]   Return if symptoms worsen or fail to improve.  Philmore Pali, MSN, NP-C  10/17/2013, 3:59 PM [facility] [address], [place], [place] [address] [phone]  Note: This document was prepared with digital dictation and possible smart phrase technology. Any transcriptional errors that result from this process are unintentional.

## 2013-10-29 ENCOUNTER — Ambulatory Visit
Admission: RE | Admit: 2013-10-29 | Discharge: 2013-10-29 | Disposition: A | Discharge status: Home / Self Care | Payer: PRIVATE HEALTH INSURANCE | Source: Ambulatory Visit | Attending: Nurse Practitioner | Admitting: Nurse Practitioner

## 2013-10-29 DIAGNOSIS — K118 Other diseases of salivary glands: Secondary | ICD-10-CM

## 2013-10-29 DIAGNOSIS — R519 Headache, unspecified: Secondary | ICD-10-CM

## 2013-10-29 DIAGNOSIS — R51 Headache: Secondary | ICD-10-CM

## 2013-10-29 MED ORDER — GADOBENATE DIMEGLUMINE 529 MG/ML IV SOLN
19.0000 mL | Freq: Once | INTRAVENOUS | Status: AC | PRN
  Administered 2013-10-29: 19 mL via INTRAVENOUS

## 2013-11-12 ENCOUNTER — Telehealth: Payer: Self-pay | Admitting: Diagnostic Neuroimaging

## 2013-11-12 NOTE — Telephone Encounter (Signed)
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 No change from MRI on 11/15/12. No action needs to be taken for this test and to keep any upcoming appt or test that pt might have and if the pt has any other problems, questions or concerns to call the office.

## 2013-11-12 NOTE — Telephone Encounter (Signed)
Patient calling wanting to know the results of recent imaging. She will not be home for a few hours and said it was okay to leave a message on her answering machine.  Please call to advise.

## 2013-11-13 NOTE — Telephone Encounter (Signed)
Called pt back to inform her per Jeani Hawking, NP of pt's MRI results. Pt verbalized understanding.

## 2013-11-13 NOTE — Telephone Encounter (Signed)
Patient returning call to state that she received the voicemail message but did not understand what it said. Please return call to patient and advise.

## 2013-11-22 NOTE — Progress Notes (Signed)
I reviewed note and agree with plan.   William Schake R. Jasdeep Kepner, MD  Certified in Neurology, Neurophysiology and Neuroimaging  [facility] [address], [address] [place], [place] [address] [phone]   

## 2013-11-22 NOTE — Telephone Encounter (Signed)
Patient has additional questions regarding MRI results.  Please call and advise. ° °

## 2013-11-22 NOTE — Telephone Encounter (Signed)
Tried to reach patient 3 times. No answer.

## 2013-12-04 ENCOUNTER — Other Ambulatory Visit: Payer: Self-pay

## 2013-12-04 DIAGNOSIS — R6889 Other general symptoms and signs: Secondary | ICD-10-CM

## 2013-12-04 DIAGNOSIS — R519 Headache, unspecified: Secondary | ICD-10-CM

## 2013-12-04 DIAGNOSIS — R51 Headache: Principal | ICD-10-CM

## 2013-12-04 MED ORDER — GABAPENTIN 300 MG PO CAPS: 600.0000 mg | ORAL_CAPSULE | Freq: Three times a day (TID) | ORAL | Status: DC

## 2013-12-04 MED ORDER — CARBAMAZEPINE ER 100 MG PO CP12: 200.0000 mg | ORAL_CAPSULE | Freq: Two times a day (BID) | ORAL | Status: DC

## 2014-04-21 ENCOUNTER — Ambulatory Visit: Payer: Medicare Other | Admitting: Diagnostic Neuroimaging

## 2014-04-28 ENCOUNTER — Ambulatory Visit: Payer: Medicare Other | Admitting: Diagnostic Neuroimaging

## 2014-04-30 ENCOUNTER — Telehealth: Payer: Self-pay

## 2014-04-30 NOTE — Telephone Encounter (Signed)
Called patient to reschedule appt from 04/28/14 provider cancellation. No answer.

## 2014-05-26 ENCOUNTER — Ambulatory Visit (INDEPENDENT_AMBULATORY_CARE_PROVIDER_SITE_OTHER): Payer: Medicare Other | Admitting: Diagnostic Neuroimaging

## 2014-05-26 ENCOUNTER — Encounter: Payer: Self-pay | Admitting: Diagnostic Neuroimaging

## 2014-05-26 VITALS — BP 118/68 | HR 62 | Temp 97.4°F | Ht 63.0 in | Wt 204.0 lb

## 2014-05-26 DIAGNOSIS — K118 Other diseases of salivary glands: Secondary | ICD-10-CM

## 2014-05-26 DIAGNOSIS — R519 Headache, unspecified: Secondary | ICD-10-CM

## 2014-05-26 DIAGNOSIS — R51 Headache: Secondary | ICD-10-CM

## 2014-05-26 DIAGNOSIS — R220 Localized swelling, mass and lump, head: Secondary | ICD-10-CM

## 2014-05-26 NOTE — Patient Instructions (Signed)
Taper gabapentin off over next 4-6 weeks.  Then increase carbamazpine up to 200mg  twice a day.

## 2014-05-26 NOTE — Progress Notes (Signed)
PATIENT: Jenna Maldonado DOB: [birth year]  REASON FOR VISIT: follow up for facial pain, neuropathy HISTORY FROM: patient  Chief Complaint  Patient presents with  . Facial Pain     HISTORY OF PRESENT ILLNESS:  UPDATE 05/26/14 (VRP): Since last visit, doing about the same. Reports daily, 7/10 level pain in the right face, pre-auricular, temporal, orbital, para-nasal region. Also with day time fatigue and balance diff. Taking CBZ 100/200 and gabapentin 600/600. Cannot take high gabapentin due to somnolence side effect. Also has sleep apnea (not using CPAP) and depression (on fluoxetine).   UPDATE 10/17/13 (LL): Since last visit, no change in facial pain with adding Carbamazepine.  Pain still present every day, especially in the morning.  She thinks there is swelling on the right side of her face in the temporal-cheek area. She feels pressure in her right ear, sometimes feels like fluid in the ear.  UPDATE 07/19/13 (LL): Since last visit, on gabapentin 300mg  BID, with moderate benefit, but Gabapentin makes her sleepy. She states she has more feeling in her feet. The facial pain is some better, but still bothersome. Still with right temporal, right maxillary pressure / achy pain, worse first thing in the morning.    UPDATE 11/6/149 (VP): Since last visit, on gabapentin 300mg  qhs, with mild benefit, and no side effects. Still with right temporal, right maxillary pressure / achy pain.   PRIOR HPI (01/02/13): 63 year old right-handed female with diabetes, depression, here for evaluation of right-sided facial pain. For past one year patient has right maxillary, periorbital burning pain, sometimes dull and nagging. Sometimes she has numbness in her whole head. Sometimes she has humming/buzzing sensation in her ears. She does to wake up in the morning with nausea and more pain which subsides throughout the day. She's not tried any medications for this. No over-the-counter medications. Patient had MRI of the  trigeminal nerve protocol, which showed an incidental right parotid lymphatic structure but no definite trigeminal nerve pathology.    REVIEW OF SYSTEMS: Full 14 system review of systems performed and notable only for ear pain ringing in ears trouble swallowing runny nose dizziness depression confusion nervous headache numbness nausea vomiting eye pain eye itch cough chest pain.    ALLERGIES: Allergies  Allergen Reactions  . Codeine     HOME MEDICATIONS: Outpatient Prescriptions Prior to Visit  Medication Sig Dispense Refill  . ACCU-CHEK SMARTVIEW test strip     . carbamazepine (CARBATROL) 100 MG 12 hr capsule Take 2 capsules (200 mg total) by mouth 2 (two) times daily. 360 capsule 1  . FLUoxetine (PROZAC) 40 MG capsule Take 40 mg by mouth daily.     [name] glimepiride (AMARYL) 4 MG tablet Take 4 mg by mouth 2 (two) times daily.    [name] LEVEMIR FLEXPEN 100 UNIT/ML SOPN Inject 45 Units as directed daily.    [name] levothyroxine (SYNTHROID, LEVOTHROID) 25 MCG tablet Take 25 mcg by mouth daily.     . metFORMIN (GLUCOPHAGE) 500 MG tablet Take 500 mg by mouth daily.    [name] omeprazole (PRILOSEC) 20 MG capsule Take 20 mg by mouth 2 (two) times daily before a meal.     . gabapentin (NEURONTIN) 300 MG capsule Take 2 capsules (600 mg total) by mouth 3 (three) times daily. (Patient taking differently: Take 600 mg by mouth 2 (two) times daily. ) 540 capsule 1   No facility-administered medications prior to visit.     PHYSICAL EXAM  Filed Vitals:   05/26/14 1311  BP: 118/68  Pulse: 62  Temp: 97.4 F (36.3 C)  TempSrc: Oral  Height: 5\' 3"  (1.6 m)  Weight: 204 lb (92.534 kg)   Body mass index is 36.15 kg/(m^2).  Generalized: Well developed, in no acute distress  Head: normocephalic and atraumatic. Oropharynx benign. Minor soft tissue swelling in right lateral temporal-cheek area Ears: bilateral TM's clear with good light reflex, no effusion Neck: Supple, no carotid bruits  Cardiac: Regular rate  rhythm, no murmur  Musculoskeletal: No deformity   Neurological examination  MENTAL STATUS: awake, alert, language fluent, comprehension intact, naming intact  CRANIAL NERVE: pupils equal and reactive to light, visual fields full to confrontation, extraocular muscles intact, no nystagmus, facial sensation and strength symmetric, uvula midline, shoulder shrug symmetric, tongue midline.  MOTOR: normal bulk and tone, full strength in the BUE, BLE  SENSORY: DECR PP IN TOES. TRACE VIB AT TOES.  COORDINATION: finger-nose-finger, fine finger movements normal  REFLEXES: BUE TRACE, KNEES TRACE, ANKLES 0.  GAIT/STATION: narrow based gait; DIFF WITH TANDEM. romberg is negative  DIAGNOSTICS/IMAGING    11/15/12 MRI TRIGEMINAL - Basilar artery is midline without compression of the cisternal aspect of the fifth cranial nerve. Symmetric normal appearance of the course of the fifth cranial nerve without mass or abnormal enhancement identified. Along the inferior medial aspect of the right parotid gland, there is a nonspecific T2 hyperintense non-enhancing 1.6 x 1.1 x 1.4 cm lesion. Etiology of this findings indeterminate. 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  (Scan completed at [facility]).  10/29/13 MRI ORBITS/NECK - Unremarkable study with normal appearing anatomy of trigeminal nerve course and limited views of brain parenchyma. 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 No change from MRI on 11/15/12.   ASSESSMENT AND PLAN 63 y.o. female WITH Diabetes mellitus without complication and Depression here with atypical right facial pain, could be related to idiopathic trigeminal neuralgia, diabetes, migraine phenomenon. 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  Facial pain is unchanged after adding low dose  Carbamazepine.  Plan:  Increase Carbamazepine to 200 mg - 2 capsules BID. - taper gabapentin off over nbext 4-6 weeks (ineffective and causing side effects) - then increase Carbamazepine to 200 mg BID  Return in about 4 months (around 09/25/2014).   Penni Bombard, MD 67/05/4579, 9:98 PM Certified in Neurology, Neurophysiology and Neuroimaging  [facility] [address] [place], [place] [address] [phone]

## 2014-09-25 ENCOUNTER — Encounter: Payer: Self-pay | Admitting: Diagnostic Neuroimaging

## 2014-09-25 ENCOUNTER — Ambulatory Visit (INDEPENDENT_AMBULATORY_CARE_PROVIDER_SITE_OTHER): Payer: Medicare Other | Admitting: Diagnostic Neuroimaging

## 2014-09-25 VITALS — BP 118/70 | HR 70 | Ht 62.5 in | Wt 196.7 lb

## 2014-09-25 DIAGNOSIS — G501 Atypical facial pain: Secondary | ICD-10-CM | POA: Diagnosis not present

## 2014-09-25 NOTE — Progress Notes (Signed)
PATIENT: Jenna Maldonado DOB: 10-28-1950  REASON FOR VISIT: follow up for facial pain, neuropathy HISTORY FROM: patient  Chief Complaint  Patient presents with  . Follow-up    right facial pain     HISTORY OF PRESENT ILLNESS:  UPDATE 09/25/14 (VRP): Since last visit, tried CBZ 200mg  BID, without benefit. Now on CBZ 100mg  BID. Has stopped gabapentin.  UPDATE 05/26/14 (VRP): Since last visit, doing about the same. Reports daily, 7/10 level pain in the right face, pre-auricular, temporal, orbital, para-nasal region. Also with day time fatigue and balance diff. Taking CBZ 100/200 and gabapentin 600/600. Cannot take high gabapentin due to somnolence side effect. Also has sleep apnea (not using CPAP) and depression (on fluoxetine).   UPDATE 10/17/13 (LL): Since last visit, no change in facial pain with adding Carbamazepine.  Pain still present every day, especially in the morning.  She thinks there is swelling on the right side of her face in the temporal-cheek area. She feels pressure in her right ear, sometimes feels like fluid in the ear.  UPDATE 07/19/13 (LL): Since last visit, on gabapentin 300mg  BID, with moderate benefit, but Gabapentin makes her sleepy. She states she has more feeling in her feet. The facial pain is some better, but still bothersome. Still with right temporal, right maxillary pressure / achy pain, worse first thing in the morning.    UPDATE 11/6/149 (VP): Since last visit, on gabapentin 300mg  qhs, with mild benefit, and no side effects. Still with right temporal, right maxillary pressure / achy pain.   PRIOR HPI (01/02/13): 64 year old right-handed female with diabetes, depression, here for evaluation of right-sided facial pain. For past one year patient has right maxillary, periorbital burning pain, sometimes dull and nagging. Sometimes she has numbness in her whole head. Sometimes she has humming/buzzing sensation in her ears. She does to wake up in the morning with nausea  and more pain which subsides throughout the day. She's not tried any medications for this. No over-the-counter medications. Patient had MRI of the trigeminal nerve protocol, which showed an incidental right parotid lymphatic structure but no definite trigeminal nerve pathology.    REVIEW OF SYSTEMS: Full 14 system review of systems performed and notable only for ear pain trouble swallowing loss of vision eye pain blurred vision abd pain joint pain back pain dizziness numbness tremors.    ALLERGIES: Allergies  Allergen Reactions  . Codeine     HOME MEDICATIONS: Outpatient Prescriptions Prior to Visit  Medication Sig Dispense Refill  . ACCU-CHEK SMARTVIEW test strip     . FLUoxetine (PROZAC) 40 MG capsule Take 40 mg by mouth daily.     [name] glimepiride (AMARYL) 4 MG tablet Take 4 mg by mouth 2 (two) times daily.    . INVOKANA 100 MG TABS tablet Take 100 mg by mouth daily.  0  . LEVEMIR FLEXPEN 100 UNIT/ML SOPN Inject 45 Units as directed daily.    . metFORMIN (GLUCOPHAGE) 500 MG tablet Take 500 mg by mouth daily.    [name] omeprazole (PRILOSEC) 20 MG capsule Take 20 mg by mouth 2 (two) times daily before a meal.     . levothyroxine (SYNTHROID, LEVOTHROID) 25 MCG tablet Take 25 mcg by mouth daily.     . carbamazepine (CARBATROL) 100 MG 12 hr capsule Take 2 capsules (200 mg total) by mouth 2 (two) times daily. 360 capsule 1   No facility-administered medications prior to visit.     PHYSICAL EXAM  Filed Vitals:   09/25/14  1309 09/25/14 1318  BP:  118/70  Pulse:  70  Height: 5' 2.5" (1.588 m)   Weight: 196 lb 11.2 oz (89.223 kg)    Body mass index is 35.38 kg/(m^2).  GENERAL EXAM: Patient is in no distress; well developed, nourished and groomed; neck is supple  CARDIOVASCULAR: Regular rate and rhythm, no murmurs, no carotid bruits  NEUROLOGIC: MENTAL STATUS: awake, alert, language fluent, comprehension intact, naming intact, fund of knowledge appropriate CRANIAL NERVE: pupils equal  and reactive to light, visual fields full to confrontation, extraocular muscles intact, no nystagmus, facial sensation and strength symmetric, hearing intact, palate elevates symmetrically, uvula midline, shoulder shrug symmetric, tongue midline. MOTOR: normal bulk and tone, full strength in the BUE, BLE; LIMITED IN BILATERAL DELTOIDS DUE TO PAIN; DECR PASSIVE ROM IN SHOULDERS  SENSORY: normal to LT; DECR VIB AT ANKLES AND TOES COORDINATION: finger-nose-finger normal REFLEXES: BUE TRACE, BLE 0 GAIT/STATION: narrow based gait   DIAGNOSTICS/IMAGING    11/15/12 MRI TRIGEMINAL - Basilar artery is midline without compression of the cisternal aspect of the fifth cranial nerve. Symmetric normal appearance of the course of the fifth cranial nerve without mass or abnormal enhancement identified. Along the inferior medial aspect of the right parotid gland, there is a nonspecific T2 hyperintense non-enhancing 1.6 x 1.1 x 1.4 cm lesion. Etiology of this findings indeterminate. 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  (Scan completed at [facility]).  10/29/13 MRI ORBITS/NECK - Unremarkable study with normal appearing anatomy of trigeminal nerve course and limited views of brain parenchyma. 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 No change from MRI on 11/15/12.   ASSESSMENT AND PLAN 64 y.o. female with diabetes and depression here with atypical right facial pain, could be related to idiopathic trigeminal neuralgia, diabetes, migraine phenomenon.  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  Facial pain is unchanged after adding low dose carbamazepine. Cannot tolerate other meds. Unfortunately, I don't think I have much more to offer from a neuro standpoint.   Plan:  - refer to [facility] neurology for facial pain second  opinion, and consideration of gamma-knife therapy - stop carbamazepine   - discuss with PCP re: diffuse arthritis aches/ pain; may benefit from PT eval and strength and conditioning treatments  Return in about 1 year (around 09/25/2015).  I spent 15 minutes of face to face time with patient. Greater than 50% of time was spent in counseling and coordination of care with patient.    Penni Bombard, MD 2/62/0355, 9:74 PM Certified in Neurology, Neurophysiology and Neuroimaging  [facility] [address], [place], [place] [address] [phone]

## 2014-11-12 HISTORY — PX: CHOLECYSTECTOMY: SHX55

## 2014-11-25 ENCOUNTER — Ambulatory Visit: Payer: Medicare Other | Admitting: Diagnostic Neuroimaging

## 2015-03-14 HISTORY — PX: ESOPHAGOGASTRODUODENOSCOPY: SHX1529

## 2015-05-20 ENCOUNTER — Encounter (INDEPENDENT_AMBULATORY_CARE_PROVIDER_SITE_OTHER): Payer: Self-pay | Admitting: *Deleted

## 2015-05-27 ENCOUNTER — Encounter (INDEPENDENT_AMBULATORY_CARE_PROVIDER_SITE_OTHER): Payer: Self-pay | Admitting: Internal Medicine

## 2015-05-27 ENCOUNTER — Ambulatory Visit (INDEPENDENT_AMBULATORY_CARE_PROVIDER_SITE_OTHER): Payer: Self-pay | Admitting: Internal Medicine

## 2015-05-27 ENCOUNTER — Ambulatory Visit (INDEPENDENT_AMBULATORY_CARE_PROVIDER_SITE_OTHER): Payer: Medicare Other | Admitting: Internal Medicine

## 2015-05-27 VITALS — BP 118/50 | HR 60 | Temp 97.3°F | Ht 63.0 in | Wt 186.7 lb

## 2015-05-27 DIAGNOSIS — K3184 Gastroparesis: Secondary | ICD-10-CM

## 2015-05-27 DIAGNOSIS — R110 Nausea: Secondary | ICD-10-CM | POA: Diagnosis not present

## 2015-05-27 NOTE — Patient Instructions (Signed)
Continue the Reglan. Four meals a day.  OV in 3 months.

## 2015-05-27 NOTE — Progress Notes (Addendum)
Subjective:    Patient ID: Jenna Maldonado, female    DOB: 05/05/1951, 64 y.o.   MRN: [id number]  HPI Referred by Dr. [name] for abdominal months x 4 months. She had nausea and vomiting.  She tells me her GB had stone and she underwent a cholecystectomy in June of this year. She says she continues to have nausea and vomiting off and on since the surgery.  She says she had an EGD two months ago by Dr. Britta Mccreedy and she had a hernia. She had some irritation in her stomach. She says she has nausea every day.  She says the vomiting is better. She says she will vomit about once a month now.  She does have some tenderness to her rt upper quadrant since her GB was removed. She says she has a slow emptying stomach. I will get records from [facility]. She has been a diabetic x 10-15 years.  She is eating 3 meals a day.  She is avoiding greasey foods. She usually has a BM every 4-5 days.  No side effects from the Reglan. BS run between 70 and 90 in the morning.  12/11/2014 Laparoscopic cholecystomy:  Dr. Arvilla Market Cathey: Biliary Dyskinesia. Hx of rt upper quadrant pain, post prandial in nature, associated with nausea and vomiting. No fever or jaundice.  US revealed stones and sludge, normal CBD. A HIDA scan revealed an EF of 30% which is below normal. Liver function, CBC, and Lipase were normal.  Biopsy: Cholelithiasis with chronic cholecystitis.   03/31/2015 POC glucose 115 01/21/2015 Glucose 150, BUN 25, Creatinine 0.87, ALP 97, AST16.2, Total bili 0.2, ALT 15, NA 140, K 4.5, Chloride 100, Lipase 28.7 WBC 7.5, RBC 4.96, Hemoglobin 14.3, Hematocrit 43.3, MCV 87.3, Platelet ct 193.  03/31/2015 EGD Dr. Britta Mccreedy: Nausea: Mucosa of the esophagus appeared normal. Gastritis was found in the gastric antrum, biopsy was performed. The duodenal mucosa showed no abnormalities. Retroflexed views revealed hiatal hernia. Biopsy: Gastric epithelium. No significant abnormality identified.   01/29/2015 NM Gastric emptying study:  Four month hx of nausea and vomiting: 5% emptied at 1 hr. 20% emptied at 2 hrs. 35% emptied at 3 hrs 65% emptied at 4 hrs. Impression: Delayed gastric emptying study.   01/23/2015 US Abdomen Complete: Nausea and vomiting:  Prior cholecystectomy. No fluid collection gallbladder fossa. Otherwise normal abdominal US.  CBC 3.1. Pancrease: Visualized portion unremarkable. Spleen: normal.  Liver: Normal.  Review of Systems   Past Medical History  Diagnosis Date  . Diabetes mellitus without complication ([place])   . Depression     Past Surgical History  Procedure Laterality Date  . Shoulder surgery Left   . Tubal ligation    . Hand surgery Bilateral 2001  . Eye surgrey Bilateral     4-5 times   . Carpel tunnel    . Cholecystectomy      June 2016    Allergies  Allergen Reactions  . Codeine     Current Outpatient Prescriptions on File Prior to Visit  Medication Sig Dispense Refill  . ACCU-CHEK SMARTVIEW test strip Reported on 05/27/2015    . carbamazepine (TEGRETOL XR) 100 MG 12 hr tablet Take 100 mg by mouth 3 (three) times daily.     [name] FLUoxetine (PROZAC) 40 MG capsule Take 40 mg by mouth daily.     [name] glimepiride (AMARYL) 4 MG tablet Take 4 mg by mouth 2 (two) times daily.    . INVOKANA 100 MG TABS tablet Take 100 mg by  mouth daily.  0  . LEVEMIR FLEXPEN 100 UNIT/ML SOPN Inject 50 Units as directed daily.     [name] levothyroxine (SYNTHROID, LEVOTHROID) 25 MCG tablet Take 25 mcg by mouth daily.     . metFORMIN (GLUCOPHAGE) 500 MG tablet Take 500 mg by mouth daily.    [name] omeprazole (PRILOSEC) 20 MG capsule Take 20 mg by mouth 2 (two) times daily before a meal.     . pravastatin (PRAVACHOL) 20 MG tablet Take 1 tablet by mouth daily.    [name] tobramycin (TOBREX) 0.3 % ophthalmic solution Reported on 05/27/2015  5   No current facility-administered medications on file prior to visit.        Objective:   Physical Exam Blood pressure 118/50, pulse 60, temperature 97.3 F (36.3 C), height  5\' 3"  (1.6 m), weight 186 lb 11.2 oz (84.687 kg).  Alert and oriented. Skin warm and dry. Oral mucosa is moist.   . Sclera anicteric, conjunctivae is pink. Thyroid not enlarged. No cervical lymphadenopathy. Lungs clear. Heart regular rate and rhythm.  Abdomen is soft. Bowel sounds are positive. No hepatomegaly. No abdominal masses felt. No tenderness.  No edema to lower extremities.         Assessment & Plan:  Nausea and vomiting which is better now. Presently taking Reglan three times a day without side effects. Will get records from Dr. [name] and Dr. Britta Mccreedy.  Gastroparesis. Needs to eat 4 meals a day.  OV in 3 months.  CBC and CMET today

## 2015-05-28 ENCOUNTER — Encounter (INDEPENDENT_AMBULATORY_CARE_PROVIDER_SITE_OTHER): Payer: Self-pay

## 2015-05-28 LAB — COMPREHENSIVE METABOLIC PANEL
ALBUMIN: 4 g/dL (ref 3.6–5.1)
ALT: 17 U/L (ref 6–29)
AST: 17 U/L (ref 10–35)
Alkaline Phosphatase: 99 U/L (ref 33–130)
BILIRUBIN TOTAL: 0.3 mg/dL (ref 0.2–1.2)
BUN: 25 mg/dL (ref 7–25)
CO2: 30 mmol/L (ref 20–31)
CREATININE: 0.82 mg/dL (ref 0.50–0.99)
Calcium: 9.2 mg/dL (ref 8.6–10.4)
Chloride: 103 mmol/L (ref 98–110)
Glucose, Bld: 104 mg/dL — ABNORMAL HIGH (ref 65–99)
Potassium: 4.4 mmol/L (ref 3.5–5.3)
SODIUM: 140 mmol/L (ref 135–146)
Total Protein: 7 g/dL (ref 6.1–8.1)

## 2015-05-28 LAB — CBC WITH DIFFERENTIAL/PLATELET
Basophils Absolute: 0 10*3/uL (ref 0.0–0.1)
Basophils Relative: 0 % (ref 0–1)
EOS PCT: 2 % (ref 0–5)
Eosinophils Absolute: 0.2 10*3/uL (ref 0.0–0.7)
HEMATOCRIT: 41.2 % (ref 36.0–46.0)
Hemoglobin: 14.6 g/dL (ref 12.0–15.0)
LYMPHS ABS: 4.2 10*3/uL — AB (ref 0.7–4.0)
LYMPHS PCT: 46 % (ref 12–46)
MCH: 30 pg (ref 26.0–34.0)
MCHC: 35.4 g/dL (ref 30.0–36.0)
MCV: 84.8 fL (ref 78.0–100.0)
MPV: 9.5 fL (ref 8.6–12.4)
Monocytes Absolute: 0.6 10*3/uL (ref 0.1–1.0)
Monocytes Relative: 7 % (ref 3–12)
NEUTROS PCT: 45 % (ref 43–77)
Neutro Abs: 4.1 10*3/uL (ref 1.7–7.7)
Platelets: 189 10*3/uL (ref 150–400)
RBC: 4.86 MIL/uL (ref 3.87–5.11)
RDW: 13.3 % (ref 11.5–15.5)
WBC: 9.1 10*3/uL (ref 4.0–10.5)

## 2015-08-25 ENCOUNTER — Ambulatory Visit (INDEPENDENT_AMBULATORY_CARE_PROVIDER_SITE_OTHER): Payer: Medicare Other | Admitting: Internal Medicine

## 2015-09-29 ENCOUNTER — Ambulatory Visit: Payer: Medicare Other | Admitting: Diagnostic Neuroimaging

## 2015-10-14 ENCOUNTER — Emergency Department (HOSPITAL_COMMUNITY): Payer: Medicare Other

## 2015-10-14 ENCOUNTER — Inpatient Hospital Stay (HOSPITAL_COMMUNITY)
Admission: EM | Admit: 2015-10-14 | Discharge: 2015-10-24 | DRG: 074 | Disposition: A | Discharge status: Skilled Nursing Facility | Payer: Medicare Other | Attending: Internal Medicine | Admitting: Internal Medicine

## 2015-10-14 ENCOUNTER — Encounter (HOSPITAL_COMMUNITY): Payer: Self-pay | Admitting: Family Medicine

## 2015-10-14 DIAGNOSIS — I951 Orthostatic hypotension: Secondary | ICD-10-CM | POA: Diagnosis present

## 2015-10-14 DIAGNOSIS — E1143 Type 2 diabetes mellitus with diabetic autonomic (poly)neuropathy: Secondary | ICD-10-CM | POA: Diagnosis not present

## 2015-10-14 DIAGNOSIS — Z794 Long term (current) use of insulin: Secondary | ICD-10-CM

## 2015-10-14 DIAGNOSIS — F329 Major depressive disorder, single episode, unspecified: Secondary | ICD-10-CM | POA: Diagnosis not present

## 2015-10-14 DIAGNOSIS — G20 Parkinson's disease: Secondary | ICD-10-CM | POA: Diagnosis present

## 2015-10-14 DIAGNOSIS — E876 Hypokalemia: Secondary | ICD-10-CM

## 2015-10-14 DIAGNOSIS — Z881 Allergy status to other antibiotic agents status: Secondary | ICD-10-CM

## 2015-10-14 DIAGNOSIS — E119 Type 2 diabetes mellitus without complications: Secondary | ICD-10-CM | POA: Diagnosis not present

## 2015-10-14 DIAGNOSIS — Z885 Allergy status to narcotic agent status: Secondary | ICD-10-CM

## 2015-10-14 DIAGNOSIS — E279 Disorder of adrenal gland, unspecified: Secondary | ICD-10-CM | POA: Diagnosis present

## 2015-10-14 DIAGNOSIS — R112 Nausea with vomiting, unspecified: Secondary | ICD-10-CM | POA: Diagnosis not present

## 2015-10-14 DIAGNOSIS — R1110 Vomiting, unspecified: Secondary | ICD-10-CM | POA: Diagnosis present

## 2015-10-14 DIAGNOSIS — M795 Residual foreign body in soft tissue: Secondary | ICD-10-CM

## 2015-10-14 DIAGNOSIS — K5900 Constipation, unspecified: Secondary | ICD-10-CM | POA: Insufficient documentation

## 2015-10-14 DIAGNOSIS — E278 Other specified disorders of adrenal gland: Secondary | ICD-10-CM | POA: Diagnosis present

## 2015-10-14 DIAGNOSIS — E039 Hypothyroidism, unspecified: Secondary | ICD-10-CM | POA: Diagnosis not present

## 2015-10-14 DIAGNOSIS — Z9049 Acquired absence of other specified parts of digestive tract: Secondary | ICD-10-CM

## 2015-10-14 DIAGNOSIS — K3184 Gastroparesis: Secondary | ICD-10-CM | POA: Diagnosis present

## 2015-10-14 DIAGNOSIS — I4510 Unspecified right bundle-branch block: Secondary | ICD-10-CM

## 2015-10-14 DIAGNOSIS — R51 Headache: Secondary | ICD-10-CM

## 2015-10-14 DIAGNOSIS — E1165 Type 2 diabetes mellitus with hyperglycemia: Secondary | ICD-10-CM | POA: Diagnosis present

## 2015-10-14 DIAGNOSIS — R339 Retention of urine, unspecified: Secondary | ICD-10-CM | POA: Diagnosis not present

## 2015-10-14 DIAGNOSIS — R519 Headache, unspecified: Secondary | ICD-10-CM | POA: Insufficient documentation

## 2015-10-14 DIAGNOSIS — E1169 Type 2 diabetes mellitus with other specified complication: Secondary | ICD-10-CM | POA: Diagnosis present

## 2015-10-14 DIAGNOSIS — Z888 Allergy status to other drugs, medicaments and biological substances status: Secondary | ICD-10-CM

## 2015-10-14 DIAGNOSIS — R42 Dizziness and giddiness: Secondary | ICD-10-CM

## 2015-10-14 DIAGNOSIS — K219 Gastro-esophageal reflux disease without esophagitis: Secondary | ICD-10-CM | POA: Diagnosis present

## 2015-10-14 DIAGNOSIS — Z79899 Other long term (current) drug therapy: Secondary | ICD-10-CM

## 2015-10-14 DIAGNOSIS — R0602 Shortness of breath: Secondary | ICD-10-CM

## 2015-10-14 LAB — CBC
HEMATOCRIT: 42 % (ref 36.0–46.0)
HEMATOCRIT: 40.1 % (ref 36.0–46.0)
HEMOGLOBIN: 14.2 g/dL (ref 12.0–15.0)
HEMOGLOBIN: 13.7 g/dL (ref 12.0–15.0)
MCH: 29 pg (ref 26.0–34.0)
MCH: 29.3 pg (ref 26.0–34.0)
MCHC: 33.8 g/dL (ref 30.0–36.0)
MCHC: 34.2 g/dL (ref 30.0–36.0)
MCV: 85.9 fL (ref 78.0–100.0)
MCV: 85.7 fL (ref 78.0–100.0)
Platelets: 213 10*3/uL (ref 150–400)
Platelets: 176 10*3/uL (ref 150–400)
RBC: 4.89 MIL/uL (ref 3.87–5.11)
RBC: 4.68 MIL/uL (ref 3.87–5.11)
RDW: 12.6 % (ref 11.5–15.5)
RDW: 12.6 % (ref 11.5–15.5)
WBC: 9.2 10*3/uL (ref 4.0–10.5)
WBC: 7.6 10*3/uL (ref 4.0–10.5)

## 2015-10-14 LAB — URINE MICROSCOPIC-ADD ON: RBC / HPF: NONE SEEN RBC/hpf (ref 0–5)

## 2015-10-14 LAB — URINALYSIS, ROUTINE W REFLEX MICROSCOPIC
Bilirubin Urine: NEGATIVE
Hgb urine dipstick: NEGATIVE
Ketones, ur: NEGATIVE mg/dL
NITRITE: NEGATIVE
PH: 6 (ref 5.0–8.0)
Protein, ur: NEGATIVE mg/dL
SPECIFIC GRAVITY, URINE: 1.025 (ref 1.005–1.030)

## 2015-10-14 LAB — CREATININE, SERUM
CREATININE: 0.76 mg/dL (ref 0.44–1.00)
GFR calc Af Amer: >60 mL/min (ref >60)

## 2015-10-14 LAB — GLUCOSE, CAPILLARY
Glucose-Capillary: 84 mg/dL (ref 65–99)
Glucose-Capillary: 125 mg/dL — ABNORMAL HIGH (ref 65–99)

## 2015-10-14 LAB — COMPREHENSIVE METABOLIC PANEL
ALBUMIN: 3.7 g/dL (ref 3.5–5.0)
ALT: 26 U/L (ref 14–54)
ANION GAP: 11 (ref 5–15)
AST: 21 U/L (ref 15–41)
Alkaline Phosphatase: 106 U/L (ref 38–126)
BILIRUBIN TOTAL: 0.6 mg/dL (ref 0.3–1.2)
BUN: 28 mg/dL — ABNORMAL HIGH (ref 6–20)
CHLORIDE: 101 mmol/L (ref 101–111)
CO2: 27 mmol/L (ref 22–32)
Calcium: 9.6 mg/dL (ref 8.9–10.3)
Creatinine, Ser: 0.91 mg/dL (ref 0.44–1.00)
GFR calc Af Amer: >60 mL/min (ref >60)
GLUCOSE: 151 mg/dL — AB (ref 65–99)
POTASSIUM: 4.1 mmol/L (ref 3.5–5.1)
Sodium: 139 mmol/L (ref 135–145)
TOTAL PROTEIN: 7 g/dL (ref 6.5–8.1)

## 2015-10-14 LAB — TSH: TSH: 3.039 u[IU]/mL (ref 0.350–4.500)

## 2015-10-14 LAB — I-STAT TROPONIN, ED: TROPONIN I, POC: 0 ng/mL (ref 0.00–0.08)

## 2015-10-14 LAB — LIPASE, BLOOD: Lipase: 26 U/L (ref 11–51)

## 2015-10-14 MED ORDER — IOPAMIDOL (ISOVUE-300) INJECTION 61%
INTRAVENOUS | Status: AC
  Administered 2015-10-14: 100 mL
  Filled 2015-10-14: qty 100

## 2015-10-14 MED ORDER — ONDANSETRON HCL 4 MG PO TABS
4.0000 mg | ORAL_TABLET | Freq: Three times a day (TID) | ORAL | Status: DC | PRN
  Administered 2015-10-15: 4 mg via ORAL
  Filled 2015-10-14: qty 1

## 2015-10-14 MED ORDER — SODIUM CHLORIDE 0.9 % IV SOLN
Freq: Once | INTRAVENOUS | Status: AC
  Administered 2015-10-14: 14:00:00 via INTRAVENOUS

## 2015-10-14 MED ORDER — SODIUM CHLORIDE 0.9 % IV BOLUS (SEPSIS)
1000.0000 mL | Freq: Once | INTRAVENOUS | Status: AC
  Administered 2015-10-14: 1000 mL via INTRAVENOUS

## 2015-10-14 MED ORDER — LEVOTHYROXINE SODIUM 50 MCG PO TABS
50.0000 ug | ORAL_TABLET | Freq: Every day | ORAL | Status: DC
  Administered 2015-10-15 – 2015-10-24 (×10): 50 ug via ORAL
  Filled 2015-10-14 (×10): qty 1

## 2015-10-14 MED ORDER — CARBAMAZEPINE ER 100 MG PO TB12
100.0000 mg | ORAL_TABLET | Freq: Three times a day (TID) | ORAL | Status: DC
  Administered 2015-10-14 – 2015-10-15 (×3): 100 mg via ORAL
  Filled 2015-10-14 (×3): qty 1

## 2015-10-14 MED ORDER — ACETAMINOPHEN 500 MG PO TABS
500.0000 mg | ORAL_TABLET | ORAL | Status: DC | PRN
  Administered 2015-10-14 – 2015-10-22 (×9): 500 mg via ORAL
  Filled 2015-10-14 (×9): qty 1

## 2015-10-14 MED ORDER — MORPHINE SULFATE (PF) 4 MG/ML IV SOLN: 4.0000 mg | Freq: Once | INTRAVENOUS | Status: DC

## 2015-10-14 MED ORDER — FLUDROCORTISONE ACETATE 0.1 MG PO TABS
0.1000 mg | ORAL_TABLET | Freq: Every day | ORAL | Status: DC
  Administered 2015-10-15 – 2015-10-19 (×5): 0.1 mg via ORAL
  Filled 2015-10-14 (×5): qty 1

## 2015-10-14 MED ORDER — MIDODRINE HCL 5 MG PO TABS
2.5000 mg | ORAL_TABLET | Freq: Once | ORAL | Status: AC
  Administered 2015-10-14: 2.5 mg via ORAL
  Filled 2015-10-14: qty 1

## 2015-10-14 MED ORDER — INSULIN ASPART 100 UNIT/ML ~~LOC~~ SOLN
0.0000 [IU] | Freq: Three times a day (TID) | SUBCUTANEOUS | Status: DC
  Administered 2015-10-16: 1 [IU] via SUBCUTANEOUS
  Administered 2015-10-16: 3 [IU] via SUBCUTANEOUS
  Administered 2015-10-17: 2 [IU] via SUBCUTANEOUS
  Administered 2015-10-17: 3 [IU] via SUBCUTANEOUS
  Administered 2015-10-17: 2 [IU] via SUBCUTANEOUS
  Administered 2015-10-20 – 2015-10-23 (×5): 1 [IU] via SUBCUTANEOUS
  Administered 2015-10-24: 2 [IU] via SUBCUTANEOUS

## 2015-10-14 MED ORDER — SODIUM CHLORIDE 0.9 % IV SOLN
INTRAVENOUS | Status: DC
Start: 2015-10-14 — End: 2015-10-15
  Administered 2015-10-14: 16:00:00 via INTRAVENOUS

## 2015-10-14 MED ORDER — PRAVASTATIN SODIUM 20 MG PO TABS
20.0000 mg | ORAL_TABLET | Freq: Every day | ORAL | Status: DC
  Administered 2015-10-14 – 2015-10-24 (×11): 20 mg via ORAL
  Filled 2015-10-14 (×11): qty 1

## 2015-10-14 MED ORDER — PANTOPRAZOLE SODIUM 40 MG PO TBEC
40.0000 mg | DELAYED_RELEASE_TABLET | Freq: Every day | ORAL | Status: DC
  Administered 2015-10-14 – 2015-10-16 (×3): 40 mg via ORAL
  Filled 2015-10-14 (×3): qty 1

## 2015-10-14 MED ORDER — FLUOXETINE HCL 20 MG PO CAPS
40.0000 mg | ORAL_CAPSULE | Freq: Every day | ORAL | Status: DC
  Administered 2015-10-14 – 2015-10-24 (×11): 40 mg via ORAL
  Filled 2015-10-14 (×12): qty 2

## 2015-10-14 MED ORDER — METOCLOPRAMIDE HCL 5 MG/ML IJ SOLN
10.0000 mg | Freq: Once | INTRAMUSCULAR | Status: AC
Start: 2015-10-14 — End: 2015-10-14
  Administered 2015-10-14: 10 mg via INTRAVENOUS
  Filled 2015-10-14: qty 2

## 2015-10-14 MED ORDER — ENOXAPARIN SODIUM 40 MG/0.4ML ~~LOC~~ SOLN
40.0000 mg | SUBCUTANEOUS | Status: DC
  Administered 2015-10-14 – 2015-10-23 (×10): 40 mg via SUBCUTANEOUS
  Filled 2015-10-14 (×10): qty 0.4

## 2015-10-14 MED ORDER — INSULIN DETEMIR 100 UNIT/ML ~~LOC~~ SOLN
7.0000 [IU] | Freq: Every day | SUBCUTANEOUS | Status: DC
  Administered 2015-10-14 – 2015-10-21 (×7): 7 [IU] via SUBCUTANEOUS
  Filled 2015-10-14 (×10): qty 0.07

## 2015-10-14 NOTE — H&P (Signed)
Date: 10/14/2015               Patient Name:  Jenna Maldonado MRN: [id number]  DOB: 07/09/1950 Age / Sex: 65 y.o., female   PCP: Monico Blitz, MD         Medical Service: Internal Medicine Teaching Service         Attending Physician: Dr. Sid Falcon, MD    First Contact: Dr. Charlynn Grimes Pager: [phone]  Second Contact: Dr. Genene Churn Pager: [phone]       After Hours (After 5p/  First Contact Pager: [phone]  weekends / holidays): Second Contact Pager: [phone]   Chief Complaint: Near syncope/Emesis  History of Present Illness: Jenna Maldonado is a 65 year old female with a past medical history of DM2, gastroparesis, depression, hypothyroidism, GERD presents today with complaints of dizziness with near syncope and emesis. Patient reports 9 days ago she had a syncopal episode where she fell but did not hit her head. She was admitted at [facility] at that time. She denies any pre-syncopal symptoms. No palpations, lightheadednesszziness, vision changes. She reports standing at her door feeling fine when episode occurred. Denies any loss of bowel or bladder function at the time. No tongue biting. Reports workup at [facility] with stress test and ECHO which were unremarkable. She was orthostatic during her hospitalization and was started on midodrine. Patient discharged 3 days ago. Reports no improvement since being home with persistent lightheadedness/dizziness and orthostatic hypotension. Denies any symptoms when going from seated position to standing. Reports most of her symptoms are when she is walking. Denies any symptoms with head movement. No room spinning. She report developing nausea and vomiting with six episodes of emesis yesterday. This is a new problem over the past month. Does report taking Reglan since gallbladder was removed last June.  Reports only drinking 1-2 glasses of water a day. No fevers or chills.   Meds: Current Facility-Administered Medications  Medication Dose Route Frequency  Provider Last Rate Last Dose  . 0.9 %  sodium chloride infusion   Intravenous Continuous Tasrif Ahmed, MD 100 mL/hr at 10/14/15 1536    . acetaminophen (TYLENOL) tablet 500 mg  500 mg Oral Q4H PRN Tasrif Ahmed, MD      . carbamazepine (TEGRETOL XR) 12 hr tablet 100 mg  100 mg Oral TID Tasrif Ahmed, MD      . enoxaparin (LOVENOX) injection 40 mg  40 mg Subcutaneous Q24H Tasrif Ahmed, MD      . Derrill Memo ON 10/15/2015] fludrocortisone (FLORINEF) tablet 0.1 mg  0.1 mg Oral Daily [name], MD      . FLUoxetine (PROZAC) capsule 40 mg  40 mg Oral Daily Tasrif Ahmed, MD      . Derrill Memo ON 10/15/2015] insulin aspart (novoLOG) injection 0-9 Units  0-9 Units Subcutaneous TID WC [name], MD      . insulin detemir (LEVEMIR) injection 7 Units  7 Units Subcutaneous Daily Tasrif Ahmed, MD      . Derrill Memo ON 10/15/2015] levothyroxine (SYNTHROID, LEVOTHROID) tablet 50 mcg  50 mcg Oral QAC breakfast Tasrif Ahmed, MD      . morphine 4 MG/ML injection 4 mg  4 mg Intravenous Once Forde Dandy, MD   4 mg at 10/14/15 1430  . ondansetron (ZOFRAN) tablet 4 mg  4 mg Oral Q8H PRN Tasrif Ahmed, MD      . pantoprazole (PROTONIX) EC tablet 40 mg  40 mg Oral Daily [name], MD      .  pravastatin (PRAVACHOL) tablet 20 mg  20 mg Oral Daily [name], MD        Allergies: Allergies as of 10/14/2015 - Review Complete 10/14/2015  Allergen Reaction Noted  . Atorvastatin Other (See Comments) 10/14/2015  . Ciprofloxacin Itching 10/14/2015  . Codeine Nausea And Vomiting 01/02/2013  . Flagyl [metronidazole] Itching 10/14/2015  . Hydrocodone Nausea And Vomiting 10/14/2015  . Pioglitazone Other (See Comments) 10/14/2015  . Sulfa antibiotics Other (See Comments) 10/14/2015   Past Medical History  Diagnosis Date  . Diabetes mellitus without complication ([place])   . Depression    Past Surgical History  Procedure Laterality Date  . Shoulder surgery Left   . Tubal ligation    . Hand surgery Bilateral 2001  . Eye surgrey  Bilateral     4-5 times   . Carpel tunnel    . Cholecystectomy      June 2016   Family History  Problem Relation Age of Onset  . Cancer Mother   . Suicidality Father     GSW   Social History   Social History  . Marital Status: Married    Spouse Name: Laverna Peace  . Number of Children: 2  . Years of Education: 12   Occupational History  . N/A    Social History Main Topics  . Smoking status: Never Smoker   . Smokeless tobacco: Never Used  . Alcohol Use: No  . Drug Use: No  . Sexual Activity: Not on file   Other Topics Concern  . Not on file   Social History Narrative   Patient lives at home alone.   Caffeine Use: occasionally    Review of Systems: Pertinent items noted in HPI and remainder of comprehensive ROS otherwise negative.  Physical Exam: Blood pressure 158/72, pulse 69, temperature 98.4 F (36.9 C), temperature source Oral, resp. rate 20, height 5\' 3"  (1.6 m), weight 171 lb 1.6 oz (77.61 kg), SpO2 98 %. General: alert, obese female, cooperative to examination.  Head: normocephalic and atraumatic.  Eyes: vision grossly intact, pupils equal, pupils round, pupils reactive to light, no injection and anicteric.  Mouth: pharynx pink and dry, no erythema, and no exudates.  Neck: supple, full ROM, no thyromegaly, no JVD, and no carotid bruits.  Lungs: normal respiratory effort, no accessory muscle use, normal breath sounds, no crackles. Heart: normal rate, regular rhythm, no murmur, no gallop, and no rub.  Abdomen: soft, non-tender, normal bowel sounds, no distention, no guarding, no rebound tenderness, no hepatomegaly, and no splenomegaly.  Msk: no joint swelling, no joint warmth, and no redness over joints.  Pulses: 2+ DP/PT pulses bilaterally Extremities: No cyanosis, clubbing, edema Neurologic: alert & oriented X3, no focal deficits.  Skin: turgor normal and no rashes.  Psych: Oriented X3, memory intact for recent and remote, normally interactive, good eye  contact, not anxious appearing, and not depressed appearin   Lab results: Basic Metabolic Panel:  Recent Labs  10/14/15 0916 10/14/15 1530  NA 139  --   K 4.1  --   CL 101  --   CO2 27  --   GLUCOSE 151*  --   BUN 28*  --   CREATININE 0.91 0.76  CALCIUM 9.6  --    Liver Function Tests:  Recent Labs  10/14/15 0916  AST 21  ALT 26  ALKPHOS 106  BILITOT 0.6  PROT 7.0  ALBUMIN 3.7    Recent Labs  10/14/15 0916  LIPASE 26   No results for  input(s): AMMONIA in the last 72 hours. CBC:  Recent Labs  10/14/15 0916 10/14/15 1530  WBC 9.2 7.6  HGB 14.2 13.7  HCT 42.0 40.1  MCV 85.9 85.7  PLT 213 176   CBG:  Recent Labs  10/14/15 1706  GLUCAP 84   Urinalysis:  Recent Labs  10/14/15 1221  COLORURINE YELLOW  LABSPEC 1.025  PHURINE 6.0  GLUCOSEU >1000*  HGBUR NEGATIVE  BILIRUBINUR NEGATIVE  KETONESUR NEGATIVE  PROTEINUR NEGATIVE  NITRITE NEGATIVE  LEUKOCYTESUR MODERATE*   Imaging results:  Ct Abdomen Pelvis W Contrast  10/14/2015  CLINICAL DATA:  Lower abdominal pain, abdominal distension for the last few days EXAM: CT ABDOMEN AND PELVIS WITH CONTRAST TECHNIQUE: Multidetector CT imaging of the abdomen and pelvis was performed using the standard protocol following bolus administration of intravenous contrast. CONTRAST:  140mL ISOVUE-300 IOPAMIDOL (ISOVUE-300) INJECTION 61% COMPARISON:  Lumbar spine x-ray 10/06/2015 FINDINGS: Lower chest: Lung bases are unremarkable. A metallic pellet is noted in right anterior lower chest wall. Hepatobiliary: Enhanced liver shows no focal mass. Postcholecystectomy surgical clips are noted. No intrahepatic biliary ductal dilatation. Pancreas: Enhanced pancreas is unremarkable. Spleen: Enhanced spleen shows no focal mass or perisplenic fluid. Adrenals/Urinary Tract: Unremarkable right adrenal gland. 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 Enhanced  kidneys are symmetrical in size. No hydronephrosis or hydroureter. There is nonobstructive calcified calculus in lower pole of the left kidney measures 3.2 mm. Delayed renal images shows bilateral renal symmetrical excretion. Bilateral visualized proximal ureter is unremarkable. There is cortical scarring in lower pole of the right kidney. Nonspecific mild perinephric stranding bilaterally. The urinary bladder is unremarkable.  Pelvic phleboliths are noted. Stomach/Bowel: Study is limited without oral contrast. There is no gastric outlet obstruction. No small bowel obstruction. Abundant stool and moderate gas noted within redundant transverse colon. Moderate stool noted descending colon and sigmoid colon. Abundant stool noted within rectum. The rectum measures 6.5 cm in diameter suspicious for mild fecal impaction. There is no evidence of colitis or diverticulitis. Noted in right colon. No pericecal inflammation. The terminal ileum is unremarkable. Normal appendix is clearly visualized in coronal image 56 and axial image 45. Vascular/Lymphatic: No aortic aneurysm. No retroperitoneal or mesenteric adenopathy. Reproductive: The uterus is anteflexed. No adnexal mass. Visualized ovaries are unremarkable. Post tubal ligation surgical clips are noted. Other: No ascites or free air. Musculoskeletal: Sagittal images of the spine shows stable mild compression deformity upper endplate of L3 vertebral body. There is moderate compression deformity upper endplate of 624THL vertebral body with slight progression from prior exam. There is moderate spinal canal stenosis at T11-T12 level due to posterior spurring. Mild spinal canal stenosis due to posterior spurring at L5-S1 level. IMPRESSION: 1. There is no evidence of acute inflammatory process within abdomen. Status postcholecystectomy. 2. 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 3. Colonic stool throughout the colon as described above. Abundant  stool noted within rectum which measures 6.5 cm in diameter suspicious for mild fecal impaction. No evidence of colitis or diverticulitis. 4. Normal appendix.  No pericecal inflammation. 5. No small bowel obstruction. 6. Stable mild compression deformity upper endplate of L3 vertebral body. Mild progression moderate compression deformity upper endplate of 624THL vertebral body with indeterminate age. Clinical correlation is necessary. Moderate spinal canal stenosis due to posterior spurring at T11-T12 level. 7. Unremarkable uterus and ovaries. 8. No hydronephrosis or hydroureter. Left nonobstructive nephrolithiasis. Electronically Signed   By:  Lahoma Crocker M.D.   On: 10/14/2015 12:37   Other results: [id number], no old tracings to compare  Assessment & Plan by Problem:  Orthostatic Hypotension: Patient with new onset orthostatic hypotension and syncopal episode last week. Had cardiac work up at [facility] that was unrevealing. Will attempt to get records tomorrow. She was started on Midodrine during recent hospitalization. BP at rest is elevated but remains hypotensive on orthostatics. No room to increase dose given baseline hypertension. Patient does appear dry on exam and is on Invokana which may have caused dehydration. No electrolyte abnormalities present. 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 Cannot rule out adrenal cause of hypotension. Patient also on Tegretol, potentially contributing to her dizziness.  -Check am cortisol -Check aldosterone -Check renin level -Stop midodrine -Consider fludrocoritsonetomorrow  - Orthostatics qshift - NS 100cc/hr - Consider d/c tegretol tomorrow  - D/C invokana  DM2: Patient on Levemir 15 units qhs, glimepridie 4 mg daily, invokana 100 mg daily at home. -A1c -Levemir 7 units qhs -SSI-s -Continue Reglan  Depression: Continue home Prozac  Hypothyroidism: Continue home synthroid  Dispo:  Disposition is deferred at this time, awaiting improvement of current medical problems. Anticipated discharge in approximately 1-3 day(s).   The patient does have a current PCP Monico Blitz, MD) and does need an [facility] hospital follow-up appointment after discharge.  The patient does not know have transportation limitations that hinder transportation to clinic appointments.  Signed: [name], MD 10/14/2015, 5:43 PM

## 2015-10-14 NOTE — ED Provider Notes (Signed)
CSN: [id number]     Arrival date & time 10/14/15  0903 History   First MD Initiated Contact with Patient 10/14/15 [phone]     Chief Complaint  Patient presents with  . Emesis     (Consider location/radiation/quality/duration/timing/severity/associated sxs/prior Treatment) HPI 65 year old female who presents with nausea, vomiting, and near syncope. She has a history of diabetes. History provided by patient and her family who states that she has had gradual decline at home since having her gallbladder removed in June 2016. More recently she has been having increased episodes of lightheadedness and had a syncopal episode over 1 week ago that brought [facility] where she was admitted for syncopal workup. She had stress test and ultrasound of her heart, which were overall unremarkable. States that she was diagnosed with orthostatic hypotension and started on Midrin.  she was discharged 3 days ago, and since being at home had persistent episodes of lightheadedness especially with standing and ambulation. One day ago developed nausea, vomiting, and some abdominal pain with worsening orthostatic symptoms. It was recommended by their primary care that she come to the hospital for evaluation. She denies any recent fevers, chills, diarrhea, chest pain or difficulty breathing. No cough or URI symptoms. No urinary complaints. States that her last bowel movement was 3 days ago, and has not been able to eat or drink anything over the past 24 hours. No melena, no hematochezia.   Past Medical History  Diagnosis Date  . Diabetes mellitus without complication ([place])   . Depression    Past Surgical History  Procedure Laterality Date  . Shoulder surgery Left   . Tubal ligation    . Hand surgery Bilateral 2001  . Eye surgrey Bilateral     4-5 times   . Carpel tunnel    . Cholecystectomy      June 2016   Family History  Problem Relation Age of Onset  . Cancer Mother   . Suicidality Father      GSW   Social History  Substance Use Topics  . Smoking status: Never Smoker   . Smokeless tobacco: Never Used  . Alcohol Use: No   OB History    No data available     Review of Systems 10/14 systems reviewed and are negative other than those stated in the HPI   Allergies  Atorvastatin; Ciprofloxacin; Codeine; Flagyl; Hydrocodone; Pioglitazone; and Sulfa antibiotics  Home Medications   Prior to Admission medications   Medication Sig Start Date End Date Taking? Authorizing Provider  ACCU-CHEK SMARTVIEW test strip Reported on 05/27/2015 03/25/13  Yes Historical Provider, MD  carbamazepine (TEGRETOL XR) 100 MG 12 hr tablet Take 100 mg by mouth 3 (three) times daily.    Yes Historical Provider, MD  FLUoxetine (PROZAC) 40 MG capsule Take 40 mg by mouth daily.  12/30/12  Yes Historical Provider, MD  glimepiride (AMARYL) 4 MG tablet Take 4 mg by mouth daily with breakfast.    Yes Historical Provider, MD  INVOKANA 100 MG TABS tablet Take 100 mg by mouth daily. 03/17/14  Yes Historical Provider, MD  LEVEMIR FLEXPEN 100 UNIT/ML SOPN Inject 15 Units as directed daily.  03/29/13  Yes Historical Provider, MD  levothyroxine (SYNTHROID, LEVOTHROID) 50 MCG tablet Take 50 mcg by mouth daily before breakfast.   Yes Historical Provider, MD  metFORMIN (GLUCOPHAGE) 500 MG tablet Take 500 mg by mouth daily. 07/05/13  Yes Historical Provider, MD  metoCLOPramide (REGLAN) 10 MG tablet Take 10 mg by mouth 3 (  three) times daily before meals.   Yes Historical Provider, MD  midodrine (PROAMATINE) 2.5 MG tablet Take 2.5 mg by mouth daily. 10/11/15  Yes Historical Provider, MD  omeprazole (PRILOSEC) 20 MG capsule Take 20 mg by mouth 2 (two) times daily before a meal.  10/11/12  Yes Historical Provider, MD  ondansetron (ZOFRAN) 4 MG tablet Take 4 mg by mouth every 8 (eight) hours as needed for nausea or vomiting.   Yes Historical Provider, MD  pravastatin (PRAVACHOL) 20 MG tablet Take 1 tablet by mouth daily. 09/22/14   Yes Historical Provider, MD   BP 138/59 mmHg  Pulse 68  Temp(Src) 98.4 F (36.9 C) (Oral)  Resp 12  SpO2 95% Physical Exam Physical Exam  Nursing note and vitals reviewed. Constitutional: Appears fatigued, well nourished, non-toxic, and in no acute distress Head: Normocephalic and atraumatic.  Mouth/Throat: Oropharynx is clear and mucous membranes dry.  Neck: Normal range of motion. Neck supple.  Cardiovascular: Normal rate and regular rhythm.  No edema. Pulmonary/Chest: Effort normal and breath sounds normal.  Abdominal: Soft. There is RLQ and suprapubic tenderness. There is no rebound and no guarding.  Musculoskeletal: Normal range of motion.  Neurological: Alert, no facial droop, fluent speech, moves all extremities symmetrically Skin: Skin is warm and dry.  Psychiatric: Cooperative  ED Course  Procedures (including critical care time) Labs Review Labs Reviewed  COMPREHENSIVE METABOLIC PANEL - Abnormal; Notable for the following:    Glucose, Bld 151 (*)    BUN 28 (*)    All other components within normal limits  URINALYSIS, ROUTINE W REFLEX MICROSCOPIC (NOT AT [facility]) - Abnormal; Notable for the following:    Glucose, UA >1000 (*)    Leukocytes, UA MODERATE (*)    All other components within normal limits  URINE MICROSCOPIC-ADD ON - Abnormal; Notable for the following:    Squamous Epithelial / LPF 6-30 (*)    Bacteria, UA FEW (*)    All other components within normal limits  CBC  LIPASE, BLOOD  I-STAT TROPOININ, ED    Imaging Review Ct Abdomen Pelvis W Contrast  10/14/2015  CLINICAL DATA:  Lower abdominal pain, abdominal distension for the last few days EXAM: CT ABDOMEN AND PELVIS WITH CONTRAST TECHNIQUE: Multidetector CT imaging of the abdomen and pelvis was performed using the standard protocol following bolus administration of intravenous contrast. CONTRAST:  172mL ISOVUE-300 IOPAMIDOL (ISOVUE-300) INJECTION 61% COMPARISON:  Lumbar spine x-ray 10/06/2015 FINDINGS:  Lower chest: Lung bases are unremarkable. A metallic pellet is noted in right anterior lower chest wall. Hepatobiliary: Enhanced liver shows no focal mass. Postcholecystectomy surgical clips are noted. No intrahepatic biliary ductal dilatation. Pancreas: Enhanced pancreas is unremarkable. Spleen: Enhanced spleen shows no focal mass or perisplenic fluid. Adrenals/Urinary Tract: Unremarkable right adrenal gland. 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 Enhanced kidneys are symmetrical in size. No hydronephrosis or hydroureter. There is nonobstructive calcified calculus in lower pole of the left kidney measures 3.2 mm. Delayed renal images shows bilateral renal symmetrical excretion. Bilateral visualized proximal ureter is unremarkable. There is cortical scarring in lower pole of the right kidney. Nonspecific mild perinephric stranding bilaterally. The urinary bladder is unremarkable.  Pelvic phleboliths are noted. Stomach/Bowel: Study is limited without oral contrast. There is no gastric outlet obstruction. No small bowel obstruction. Abundant stool and moderate gas noted within redundant transverse colon. Moderate stool noted descending colon and sigmoid colon. Abundant stool noted within rectum. The rectum  measures 6.5 cm in diameter suspicious for mild fecal impaction. There is no evidence of colitis or diverticulitis. Noted in right colon. No pericecal inflammation. The terminal ileum is unremarkable. Normal appendix is clearly visualized in coronal image 56 and axial image 45. Vascular/Lymphatic: No aortic aneurysm. No retroperitoneal or mesenteric adenopathy. Reproductive: The uterus is anteflexed. No adnexal mass. Visualized ovaries are unremarkable. Post tubal ligation surgical clips are noted. Other: No ascites or free air. Musculoskeletal: Sagittal images of the spine shows stable mild compression deformity upper endplate of L3  vertebral body. There is moderate compression deformity upper endplate of 624THL vertebral body with slight progression from prior exam. There is moderate spinal canal stenosis at T11-T12 level due to posterior spurring. Mild spinal canal stenosis due to posterior spurring at L5-S1 level. IMPRESSION: 1. There is no evidence of acute inflammatory process within abdomen. Status postcholecystectomy. 2. 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 3. Colonic stool throughout the colon as described above. Abundant stool noted within rectum which measures 6.5 cm in diameter suspicious for mild fecal impaction. No evidence of colitis or diverticulitis. 4. Normal appendix.  No pericecal inflammation. 5. No small bowel obstruction. 6. Stable mild compression deformity upper endplate of L3 vertebral body. Mild progression moderate compression deformity upper endplate of 624THL vertebral body with indeterminate age. Clinical correlation is necessary. Moderate spinal canal stenosis due to posterior spurring at T11-T12 level. 7. Unremarkable uterus and ovaries. 8. No hydronephrosis or hydroureter. Left nonobstructive nephrolithiasis. Electronically Signed   By: Lahoma Crocker M.D.   On: 10/14/2015 12:37   I have personally reviewed and evaluated these images and lab results as part of my medical decision-making.   EKG Interpretation   Date/Time:  Wednesday Oct 14 2015 09:11:16 EDT Ventricular Rate:  78 PR Interval:  124 QRS Duration: 124 QT Interval:  424 QTC Calculation: 483 R Axis:   114 Text Interpretation:  Normal sinus rhythm Right bundle branch block  Abnormal ECG No prior EKG for comparison Confirmed by LIU MD, DANA [id number])  on 10/14/2015 11:29:30 AM      MDM   Final diagnoses:  Orthostatic hypotension  Non-intractable vomiting with nausea, vomiting of unspecified type    65 year old female with history of diabetes that presents with nausea, vomiting, abdominal pain and  lightheadedness. On presentation is nontoxic in no acute distress with stable vital signs. Appears dry on exam. Cardiopulmonary exam is unremarkable. Abdomen is soft and benign but with some right lower quadrant and suprapubic tenderness to palpation. CT was performed showing no acute intra-abdominal processes. Basic blood work overall is unremarkable. She has a negative troponin and her EKG shows no signs of acute ischemia or stigmata of arrhythmia. I suspect that she has persistent orthostatic hypotension due to autonomic issues related to her diabetes. On chart review she has had nausea and vomiting in the past concerning for gastroparesis. After receiving 1 L of IV fluids she continues to show signs of orthostasis with a drop in her systolic blood pressures from 110s to 80s with sitting to standing. Placed on continuous IV fluids and given doses of Midrin here in the ED. I discussed with the internal medicine team, and patient will be admitted for ongoing management. Accepted by Dr. Daryll Drown.  Forde Dandy, MD 10/14/15 1425

## 2015-10-14 NOTE — ED Notes (Signed)
Pt here for vomiting and low BP. Ss x a few days. sts her BP drops with standing.

## 2015-10-14 NOTE — Progress Notes (Signed)
SULTANA STOCKHAUSEN [id number] Admission Data: 10/14/2015 5:21 PM Attending Provider: Sid Falcon, MD  [id number], MD Consults/ Treatment Team:    Jenna Maldonado is a 65 y.o. female patient admitted from ED awake, alert  & orientated  X 3,  Full Code, VSS - Blood pressure 158/72, pulse 69, temperature 98.4 F (36.9 C), temperature source Oral, resp. rate 20, height 5\' 3"  (1.6 m), weight 77.61 kg (171 lb 1.6 oz), SpO2 98 %.,  no c/o shortness of breath, no c/o chest pain, no distress noted. Tele #[id number]  Placed.   IV site WDL: Right arm, running NS at 174ml/hour.  Allergies:   Allergies  Allergen Reactions  . Atorvastatin Other (See Comments)    intolerance  . Ciprofloxacin Itching  . Codeine Nausea And Vomiting  . Flagyl [Metronidazole] Itching  . Hydrocodone Nausea And Vomiting  . Pioglitazone Other (See Comments)    Weight gain  . Sulfa Antibiotics Other (See Comments)    intolerance     Past Medical History  Diagnosis Date  . Diabetes mellitus without complication ([place])   . Depression     Pt orientation to unit, room and routine. Information packet given to patient/family and safety video watched.  Admission INP armband ID verified with patient/family, and in place. SR up x 2, fall risk assessment complete with Patient and family verbalizing understanding of risks associated with falls. Pt verbalizes an understanding of how to use the call bell and to call for help before getting out of bed.  Skin, clean-dry- intact without evidence of bruising, or skin tears.   No evidence of skin break down noted on exam.     Will cont to monitor and assist as needed.  Dayle Points, RN 10/14/2015 5:21 PM

## 2015-10-14 NOTE — ED Notes (Signed)
PT does not want Morphine for pain

## 2015-10-15 DIAGNOSIS — E119 Type 2 diabetes mellitus without complications: Secondary | ICD-10-CM | POA: Diagnosis not present

## 2015-10-15 DIAGNOSIS — I951 Orthostatic hypotension: Secondary | ICD-10-CM | POA: Diagnosis not present

## 2015-10-15 DIAGNOSIS — Z794 Long term (current) use of insulin: Secondary | ICD-10-CM | POA: Diagnosis not present

## 2015-10-15 DIAGNOSIS — E039 Hypothyroidism, unspecified: Secondary | ICD-10-CM | POA: Diagnosis not present

## 2015-10-15 LAB — CORTISOL-AM, BLOOD: CORTISOL - AM: 13.8 ug/dL (ref 6.7–22.6)

## 2015-10-15 LAB — GLUCOSE, CAPILLARY
GLUCOSE-CAPILLARY: 115 mg/dL — AB (ref 65–99)
GLUCOSE-CAPILLARY: 131 mg/dL — AB (ref 65–99)
Glucose-Capillary: 85 mg/dL (ref 65–99)
Glucose-Capillary: 95 mg/dL (ref 65–99)

## 2015-10-15 LAB — COMPREHENSIVE METABOLIC PANEL
ALT: 20 U/L (ref 14–54)
ANION GAP: 10 (ref 5–15)
AST: 17 U/L (ref 15–41)
Albumin: 3.1 g/dL — ABNORMAL LOW (ref 3.5–5.0)
Alkaline Phosphatase: 90 U/L (ref 38–126)
BUN: 19 mg/dL (ref 6–20)
CHLORIDE: 106 mmol/L (ref 101–111)
CO2: 24 mmol/L (ref 22–32)
Calcium: 8.6 mg/dL — ABNORMAL LOW (ref 8.9–10.3)
Creatinine, Ser: 0.65 mg/dL (ref 0.44–1.00)
GFR calc Af Amer: >60 mL/min (ref >60)
Glucose, Bld: 91 mg/dL (ref 65–99)
POTASSIUM: 4.2 mmol/L (ref 3.5–5.1)
Sodium: 140 mmol/L (ref 135–145)
Total Bilirubin: 0.7 mg/dL (ref 0.3–1.2)
Total Protein: 5.8 g/dL — ABNORMAL LOW (ref 6.5–8.1)

## 2015-10-15 MED ORDER — CARBAMAZEPINE ER 100 MG PO TB12
300.0000 mg | ORAL_TABLET | Freq: Two times a day (BID) | ORAL | Status: DC
Start: 2015-10-15 — End: 2015-10-24
  Administered 2015-10-15 – 2015-10-24 (×18): 300 mg via ORAL
  Filled 2015-10-15 (×19): qty 1

## 2015-10-15 MED ORDER — ONDANSETRON HCL 4 MG/2ML IJ SOLN
INTRAMUSCULAR | Status: AC
  Administered 2015-10-15: 4 mg via INTRAVENOUS
  Filled 2015-10-15: qty 2

## 2015-10-15 MED ORDER — SODIUM CHLORIDE 0.9 % IV SOLN
INTRAVENOUS | Status: AC
  Administered 2015-10-15 – 2015-10-16 (×2): via INTRAVENOUS

## 2015-10-15 MED ORDER — SODIUM CHLORIDE 0.45 % IV SOLN: INTRAVENOUS | Status: DC

## 2015-10-15 MED ORDER — PROCHLORPERAZINE EDISYLATE 5 MG/ML IJ SOLN
10.0000 mg | Freq: Four times a day (QID) | INTRAMUSCULAR | Status: DC | PRN
  Administered 2015-10-15 – 2015-10-22 (×13): 10 mg via INTRAVENOUS
  Filled 2015-10-15 (×15): qty 2

## 2015-10-15 MED ORDER — ONDANSETRON HCL 4 MG/2ML IJ SOLN
4.0000 mg | Freq: Three times a day (TID) | INTRAMUSCULAR | Status: DC | PRN
  Administered 2015-10-15 – 2015-10-23 (×10): 4 mg via INTRAVENOUS
  Filled 2015-10-15 (×10): qty 2

## 2015-10-15 NOTE — Progress Notes (Signed)
  Date: 10/15/2015  Patient name: Jenna Maldonado  Medical record number: [id number]  Date of birth: 1950-12-29   This patient has been seen and the plan of care was discussed with the house staff. Please see Dr. Shanna Cisco note for complete details. I concur with his findings.  Sid Falcon, MD 10/15/2015, 8:50 PM

## 2015-10-15 NOTE — Progress Notes (Signed)
   Subjective: Patient with no complaints this morning. Denies any dizziness/lightheadedness. Denies any symptoms when standing up or walking to the sink this morning.   Objective: Vital signs in last 24 hours: Filed Vitals:   10/15/15 0600 10/15/15 1027 10/15/15 1028 10/15/15 1030  BP: 140/61 134/63 112/61 82/39  Pulse: 60 72 84 89  Temp: 98.1 F (36.7 C)     TempSrc:      Resp: 16     Height:      Weight:      SpO2: 98%      Weight change:   Intake/Output Summary (Last 24 hours) at 10/15/15 1319 Last data filed at 10/15/15 0930  Gross per 24 hour  Intake 531.67 ml  Output      0 ml  Net 531.67 ml    General: alert, obese female, cooperative to examination.  Lungs: normal respiratory effort, no accessory muscle use, normal breath sounds, no crackles. Heart: normal rate, regular rhythm, no murmur, no gallop, and no rub.  Abdomen: soft, non-tender, normal bowel sounds, no distention, no guarding, no rebound tenderness Msk: no joint swelling, no joint warmth, and no redness over joints.  Pulses: 2+ DP/PT pulses bilaterally Extremities: pitting edema to mid calf, pressure stockings in place Neurologic: alert & oriented X3, no focal deficits. X3   Medications: I have reviewed the patient's current medications. Scheduled Meds: . carbamazepine  300 mg Oral BID  . enoxaparin (LOVENOX) injection  40 mg Subcutaneous Q24H  . fludrocortisone  0.1 mg Oral Daily  . FLUoxetine  40 mg Oral Daily  . insulin aspart  0-9 Units Subcutaneous TID WC  . insulin detemir  7 Units Subcutaneous Daily  . levothyroxine  50 mcg Oral QAC breakfast  . pantoprazole  40 mg Oral Daily  . 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 Will continue supportive care. BP at rest is elevated  but remains hypotensive on orthostatics.  Cannot rule out adrenal cause of hypotension.  -am cortisol wnl -Check aldosterone -Check renin level -fludrocoritsone 0.1 mg daily - Orthostatics qshift - NS 100cc/hr - D/C invokana  DM2: CBGs well controlled 85-125.  -A1c -Levemir 7 units qhs -SSI-s -Continue Reglan  Hypothyroidism: TSH wnl -Continue home synthroid  Dispo: Disposition is deferred at this time, awaiting improvement of current medical problems.  Anticipated discharge in approximately 1-2 day(s).   The patient does have a current PCP Monico Blitz, MD) and does need an [facility] hospital follow-up appointment after discharge.  The patient does not have transportation limitations that hinder transportation to clinic appointments.  .Services Needed at time of discharge: Y = Yes, Blank = No PT:   OT:   RN:   Equipment:   Other:       [name], MD IMTS PGY-1 [phone] 10/15/2015, 1:19 PM

## 2015-10-15 NOTE — Care Management Note (Signed)
Case Management Note  Patient Details  Name: Jenna Maldonado MRN: [id number] Date of Birth: 1950/09/22  Subjective/Objective:                 Spoke with patient and daughter at the bedside. Patient lives at home alone in [place]. Daughter lives very close by down the street. Patient will stay with daughter after DC. Patient has RN and PT through [facility]. Patient was recently [facility] last week with same problems. Patient has walker and cane, denies need for more DME. Is driven by daughter and denies needing assistance with medications.   Action/Plan:  Will DC to home with [facility] for [facility] PT RN when medically stable (obs status does not require resumption orders).  Expected Discharge Date:                  Expected Discharge Plan:  [place]  In-House Referral:     Discharge planning Services  CM Consult  Post Acute Care Choice:  Resumption of Svcs/PTA Provider Choice offered to:  Patient, Adult Children  DME Arranged:    DME Agency:     HH Arranged:  RN, PT [place] Agency:  [facility]  Status of Service:  In process, will continue to follow  Medicare Important Message Given:    Date Medicare IM Given:    Medicare IM give by:    Date Additional Medicare IM Given:    Additional Medicare Important Message give by:     If discussed at [place] of Stay Meetings, dates discussed:    Additional Comments:  Carles Collet, RN 10/15/2015, 1:12 PM

## 2015-10-15 NOTE — Care Management Obs Status (Signed)
[place] NOTIFICATION   Patient Details  Name: Jenna Maldonado MRN: [id number] Date of Birth: 1950/11/04   Medicare Observation Status Notification Given:  Yes    Carles Collet, RN 10/15/2015, 1:11 PM

## 2015-10-16 ENCOUNTER — Observation Stay (HOSPITAL_COMMUNITY): Payer: Medicare Other

## 2015-10-16 DIAGNOSIS — R112 Nausea with vomiting, unspecified: Secondary | ICD-10-CM | POA: Diagnosis not present

## 2015-10-16 DIAGNOSIS — E1143 Type 2 diabetes mellitus with diabetic autonomic (poly)neuropathy: Secondary | ICD-10-CM | POA: Diagnosis not present

## 2015-10-16 DIAGNOSIS — R519 Headache, unspecified: Secondary | ICD-10-CM | POA: Insufficient documentation

## 2015-10-16 DIAGNOSIS — R51 Headache: Secondary | ICD-10-CM

## 2015-10-16 DIAGNOSIS — I951 Orthostatic hypotension: Secondary | ICD-10-CM | POA: Diagnosis not present

## 2015-10-16 DIAGNOSIS — K3184 Gastroparesis: Secondary | ICD-10-CM | POA: Diagnosis not present

## 2015-10-16 LAB — GLUCOSE, CAPILLARY
GLUCOSE-CAPILLARY: 68 mg/dL (ref 65–99)
GLUCOSE-CAPILLARY: 133 mg/dL — AB (ref 65–99)
GLUCOSE-CAPILLARY: 223 mg/dL — AB (ref 65–99)
Glucose-Capillary: 101 mg/dL — ABNORMAL HIGH (ref 65–99)
Glucose-Capillary: 143 mg/dL — ABNORMAL HIGH (ref 65–99)

## 2015-10-16 LAB — HEMOGLOBIN A1C
HEMOGLOBIN A1C: 6 % — AB (ref 4.8–5.6)
MEAN PLASMA GLUCOSE: 126 mg/dL

## 2015-10-16 LAB — BASIC METABOLIC PANEL
ANION GAP: 12 (ref 5–15)
BUN: 26 mg/dL — ABNORMAL HIGH (ref 6–20)
CALCIUM: 8.6 mg/dL — AB (ref 8.9–10.3)
CO2: 21 mmol/L — AB (ref 22–32)
Chloride: 103 mmol/L (ref 101–111)
Creatinine, Ser: 0.92 mg/dL (ref 0.44–1.00)
GFR calc non Af Amer: >60 mL/min (ref >60)
GLUCOSE: 146 mg/dL — AB (ref 65–99)
POTASSIUM: 4 mmol/L (ref 3.5–5.1)
Sodium: 136 mmol/L (ref 135–145)

## 2015-10-16 LAB — VITAMIN B12: Vitamin B-12: 194 pg/mL (ref 180–914)

## 2015-10-16 MED ORDER — PANTOPRAZOLE SODIUM 40 MG PO TBEC
40.0000 mg | DELAYED_RELEASE_TABLET | Freq: Two times a day (BID) | ORAL | Status: DC
  Administered 2015-10-16 – 2015-10-24 (×16): 40 mg via ORAL
  Filled 2015-10-16 (×16): qty 1

## 2015-10-16 NOTE — Discharge Summary (Signed)
Name: Jenna Maldonado MRN: [id number] DOB: Aug 11, 1950 65 y.o. PCP: Monico Blitz, MD  Date of Admission: 10/14/2015  9:16 AM Date of Discharge: 10/24/2015 Attending Physician: Sid Falcon, MD  Discharge Diagnosis:  Principal Problem:   Orthostatic hypotension Active Problems:   DM type 2, uncontrolled, with neuropathy (HCC)   Emesis   Adrenal mass, left (HCC)   RBBB   Morning headache   Constipation   Hypokalemia  Discharge Medications:   Medication List    STOP taking these medications        glimepiride 4 MG tablet  Commonly known as:  AMARYL     INVOKANA 100 MG Tabs tablet  Generic drug:  canagliflozin     LEVEMIR FLEXPEN 100 UNIT/ML Pen  Generic drug:  Insulin Detemir  Replaced by:  insulin detemir 100 UNIT/ML injection     metFORMIN 500 MG tablet  Commonly known as:  GLUCOPHAGE     metoCLOPramide 10 MG tablet  Commonly known as:  REGLAN     ondansetron 4 MG tablet  Commonly known as:  ZOFRAN  Replaced by:  ondansetron 4 MG/2ML Soln injection      TAKE these medications        ACCU-CHEK SMARTVIEW test strip  Generic drug:  glucose blood  Reported on 05/27/2015     bethanechol 10 MG tablet  Commonly known as:  URECHOLINE  Take 1 tablet (10 mg total) by mouth 3 (three) times daily.     carbamazepine 100 MG 12 hr tablet  Commonly known as:  TEGRETOL XR  Take 300 mg by mouth 2 (two) times daily.     erythromycin 400 MG/5ML suspension  Commonly known as:  EES  Take 5 mLs (400 mg total) by mouth 4 (four) times daily -  before meals and at bedtime.     feeding supplement Liqd  Take 1 Container by mouth 3 (three) times daily between meals.     fludrocortisone 0.1 MG tablet  Commonly known as:  FLORINEF  Take 2 tablets (0.2 mg total) by mouth daily.     FLUoxetine 40 MG capsule  Commonly known as:  PROZAC  Take 40 mg by mouth daily.     insulin detemir 100 UNIT/ML injection  Commonly known as:  LEVEMIR  Inject 0.05 mLs (5 Units total) into the  skin daily.     levothyroxine 50 MCG tablet  Commonly known as:  SYNTHROID, LEVOTHROID  Take 50 mcg by mouth daily before breakfast.     midodrine 2.5 MG tablet  Commonly known as:  PROAMATINE  Take 3 tablets (7.5 mg total) by mouth 3 (three) times daily as needed (Before standing up or sitting up in chair. Do not give when patient is going to be lying in bed.).     omeprazole 20 MG capsule  Commonly known as:  PRILOSEC  Take 20 mg by mouth 2 (two) times daily before a meal.     ondansetron 4 MG/2ML Soln injection  Commonly known as:  ZOFRAN  Inject 2 mLs (4 mg total) into the vein every 8 (eight) hours as needed for nausea or vomiting.     polyethylene glycol packet  Commonly known as:  MIRALAX / GLYCOLAX  Take 17 g by mouth daily.     pravastatin 20 MG tablet  Commonly known as:  PRAVACHOL  Take 1 tablet by mouth daily.     prochlorperazine 5 MG/ML injection  Commonly known as:  COMPAZINE  Inject 2 mLs (  10 mg total) into the vein every 6 (six) hours as needed.     senna-docusate 8.6-50 MG tablet  Commonly known as:  Senokot-S  Take 2 tablets by mouth 2 (two) times daily.        Disposition and follow-up:   Ms.Jenna Maldonado was discharged from [facility] in [place] condition.  At the hospital follow up visit please address:  1.   Please consider titrating up her florinef and midodrine dosing as needed for her autonomic dysfunction.  Make sure she wears compression stocking (thigh high or waist high).  Make sure she is getting good fluid intake. Liberalize salt intake.  She will need to follow up with neurology when discharged from rehab along with her PCP.  Please make sure to give her erythromycin base liquid solution 3 times daily right before each meal to help her with her nausea/vomitting.    Please do intermittent I/O caths (q8hour as needed) for urinary retention. Consider up titrating her bethanechol dosing for urinary retention as  needed.  2.  Labs / imaging needed at time of follow-up: BMET..  3.  Pending labs/ test needing follow-up: renal/aldosterone.   Follow-up Appointments: Follow-up Information    Schedule an appointment as soon as possible for a visit with [facility], MD.   Specialty:  Internal Medicine   Contact information:   [place] [address] [phone]       Schedule an appointment as soon as possible for a visit with [place].   Contact information:   [address]     [address] [place] [phone]      Discharge Instructions: Discharge Instructions    Ambulatory referral to Neurology    Complete by:  As directed   An appointment is requested in approximately: 1 week  For autonomic dysfunction.           Consultations:    Procedures Performed:  Dg Eye Foreign Body  10/16/2015  CLINICAL DATA:  Intraorbital implant; clearance prior to MRI EXAM: ORBITS FOR FOREIGN BODY - 2 VIEW COMPARISON:  None. FINDINGS: Water's views with eyes deviated toward the left and toward the right obtained. There is no intraorbital radiopaque foreign body. Paranasal sinuses are clear. No fracture or dislocation. IMPRESSION: No evidence of metallic foreign body within the orbits. Electronically Signed   By: Lowella Grip III M.D.   On: 10/16/2015 19:56   Dg Abd 1 View  10/19/2015  CLINICAL DATA:  Constipation. EXAM: ABDOMEN - 1 VIEW COMPARISON:  None. FINDINGS: Moderate stool burden throughout the colon. Nonobstructive bowel gas pattern. No free air organomegaly. Prior cholecystectomy. IMPRESSION: Moderate stool burden.  No acute findings. Electronically Signed   By: Rolm Baptise M.D.   On: 10/19/2015 18:13   Mr Brain Wo Contrast  10/16/2015  CLINICAL DATA:  65 year old diabetic female with dizziness and near syncope. Nausea and vomiting 2 days ago. Subsequent encounter. EXAM: MRI HEAD WITHOUT CONTRAST TECHNIQUE: Multiplanar, multiecho pulse  sequences of the brain and surrounding structures were obtained without intravenous contrast. COMPARISON:  10/05/2015 head CT. 10/29/2013 neck MR. Report from 11/15/2012 brain MR. FINDINGS: No acute infarct or intracranial hemorrhage. Minimal chronic microvascular disease. Mild global atrophy without hydrocephalus. No intracranial mass lesion noted on this unenhanced exam. Major intracranial vascular structures are patent. Post lens replacement. Previously question right parotid lesion not completely evaluated on present exam. Mild transverse ligament hypertrophy. IMPRESSION: No acute intracranial abnormality noted.  Please see above.  Electronically Signed   By: Genia Del M.D.   On: 10/16/2015 22:04   Ct Abdomen Pelvis W Contrast  10/14/2015  CLINICAL DATA:  Lower abdominal pain, abdominal distension for the last few days EXAM: CT ABDOMEN AND PELVIS WITH CONTRAST TECHNIQUE: Multidetector CT imaging of the abdomen and pelvis was performed using the standard protocol following bolus administration of intravenous contrast. CONTRAST:  165mL ISOVUE-300 IOPAMIDOL (ISOVUE-300) INJECTION 61% COMPARISON:  Lumbar spine x-ray 10/06/2015 FINDINGS: Lower chest: Lung bases are unremarkable. A metallic pellet is noted in right anterior lower chest wall. Hepatobiliary: Enhanced liver shows no focal mass. Postcholecystectomy surgical clips are noted. No intrahepatic biliary ductal dilatation. Pancreas: Enhanced pancreas is unremarkable. Spleen: Enhanced spleen shows no focal mass or perisplenic fluid. Adrenals/Urinary Tract: Unremarkable right adrenal gland. 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 Enhanced kidneys are symmetrical in size. No hydronephrosis or hydroureter. There is nonobstructive calcified calculus in lower pole of the left kidney measures 3.2 mm. Delayed renal images shows bilateral renal symmetrical excretion. Bilateral visualized  proximal ureter is unremarkable. There is cortical scarring in lower pole of the right kidney. Nonspecific mild perinephric stranding bilaterally. The urinary bladder is unremarkable.  Pelvic phleboliths are noted. Stomach/Bowel: Study is limited without oral contrast. There is no gastric outlet obstruction. No small bowel obstruction. Abundant stool and moderate gas noted within redundant transverse colon. Moderate stool noted descending colon and sigmoid colon. Abundant stool noted within rectum. The rectum measures 6.5 cm in diameter suspicious for mild fecal impaction. There is no evidence of colitis or diverticulitis. Noted in right colon. No pericecal inflammation. The terminal ileum is unremarkable. Normal appendix is clearly visualized in coronal image 56 and axial image 45. Vascular/Lymphatic: No aortic aneurysm. No retroperitoneal or mesenteric adenopathy. Reproductive: The uterus is anteflexed. No adnexal mass. Visualized ovaries are unremarkable. Post tubal ligation surgical clips are noted. Other: No ascites or free air. Musculoskeletal: Sagittal images of the spine shows stable mild compression deformity upper endplate of L3 vertebral body. There is moderate compression deformity upper endplate of 624THL vertebral body with slight progression from prior exam. There is moderate spinal canal stenosis at T11-T12 level due to posterior spurring. Mild spinal canal stenosis due to posterior spurring at L5-S1 level. IMPRESSION: 1. There is no evidence of acute inflammatory process within abdomen. Status postcholecystectomy. 2. 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 3. Colonic stool throughout the colon as described above. Abundant stool noted within rectum which measures 6.5 cm in diameter suspicious for mild fecal impaction. No evidence of colitis or diverticulitis. 4. Normal appendix.  No pericecal inflammation. 5. No small bowel obstruction. 6. Stable mild compression  deformity upper endplate of L3 vertebral body. Mild progression moderate compression deformity upper endplate of 624THL vertebral body with indeterminate age. Clinical correlation is necessary. Moderate spinal canal stenosis due to posterior spurring at T11-T12 level. 7. Unremarkable uterus and ovaries. 8. No hydronephrosis or hydroureter. Left nonobstructive nephrolithiasis. Electronically Signed   By: Lahoma Crocker M.D.   On: 10/14/2015 12:37   Dg Chest Port 1 View  10/20/2015  CLINICAL DATA:  Increasing shortness of breath with low-grade fever this morning. EXAM: PORTABLE CHEST 1 VIEW COMPARISON:  10/06/2015 FINDINGS: Lungs are well inflated without consolidation or effusion. Cardiomediastinal silhouette is within normal. There is minimal calcified plaque over the aortic arch. There are mild degenerative changes of the spine. Calcific density in the expected region  of the supraspinatus tendon insertion bilaterally left worse than right which may be due to calcific tendinitis. IMPRESSION: No acute cardiopulmonary disease. Electronically Signed   By: Marin Olp M.D.   On: 10/20/2015 09:52     Admission HPI:   Ms. Bunch is a 65 year old female with a past medical history of DM2, gastroparesis, depression, hypothyroidism, GERD presents today with complaints of dizziness with near syncope and emesis. Patient reports 9 days ago she had a syncopal episode where she fell but did not hit her head. She was admitted at [facility] at that time. She denies any pre-syncopal symptoms. No palpations, lightheadednesszziness, vision changes. She reports standing at her door feeling fine when episode occurred. Denies any loss of bowel or bladder function at the time. No tongue biting. Reports workup at [facility] with stress test and ECHO which were unremarkable. She was orthostatic during her hospitalization and was started on midodrine. Patient discharged 3 days ago. Reports no improvement since being home with  persistent lightheadedness/dizziness and orthostatic hypotension. Denies any symptoms when going from seated position to standing. Reports most of her symptoms are when she is walking. Denies any symptoms with head movement. No room spinning. She report developing nausea and vomiting with six episodes of emesis yesterday. This is a new problem over the past month. Does report taking Reglan since gallbladder was removed last June. Reports only drinking 1-2 glasses of water a day. No fevers or chills.    Hospital Course by problem list: Principal Problem:   Orthostatic hypotension Active Problems:   DM type 2, uncontrolled, with neuropathy (HCC)   Emesis   Adrenal mass, left (HCC)   RBBB   Morning headache   Constipation   Hypokalemia   Autonomic Failure with Parkinsonian features:   Patient was recently admitted to [facility] after syncope, found to have orthostatic hypotension. workup there included ECHO and stress test which were unremarkable. Was put on midodrine. At home she continued to have dizziness and came in to [place] after pre-syncope. Was found to be orthostatic and remained orthostatic despite fluid resusc. AM cortisol was normal. Renal-aldos ordered and still pending. Midodrine was held initially because it was not helping. Was started on florinef pending labs. She remained symptomatic from her orthostatic hypotension. Had 2 episodes of syncope in the hospital while trying to work with PT. We have done MRI of brain which was unremarkable, cardiac rhythm was unremarkable on telemetry. Consulted our neurologist for help. She had some parkinson like symptoms.  Most concerning for multi system atrophy with severe autonomic failure. Had multiple discussions with family reiterating the poor long term prognosis with family and patient. Neurologist Dr. Leonel Ramsay believes she would benefit from outpatient referral to an autonomic center or academic neurology center. Appears the closest  autonomic specialist center is located at [facility]. Spoke with patient and would prefer to see a neurologist locally. Will get set up for follow up outpatient.  - we asked her to focus on lifestyle modifications: wearing thigh high or waist high compression stocking, increasing her PO intake and salt intake, getting up very slow from lying to sitting. Asked her to avoid standing for now until she feels better. We will focus on transferring between bed and wheelchair for now.  -currently on fludrocortisone 0.2 mg daily day 1, titrate up to 0.3 mg daily over the next week at SNF -midodrine 7.5 tid. Discussed with nursing staff only when going to be upright. Can titrate up to 10 mg  tid as needed.  -appreciate PT/OT assistance, focus on tranfer techniques/sitting in chair - further rehab at SNF.  Urinary Retention:  It became apparent that she was retaining urine after few days of hospital admission. This is also likely 2/2 to her multi system failure/diabetic neurogenic bladder. We started and uptitrated bethanechol.  She required intermittent i/o caths for urinary retention. - q8hr bladder scans and -in an out caths as needed -cont bethanechol 10 mg tid, this can be titrated up to max dose 50 mg/day as needed  Nausea/Vomiting: unclear etiology but likely 2/2 to diabetic gastroparesis  she was on reglan for few months before admission without any improvement of her symptoms. We switched her to erythromycin base along with compazine. She still continued to have symptoms but they improved mildly with the medications. No further emesis currently.. Nausea slightly better.  -cont erythromycin base 250mg  TID + cont PRN compazine.  -cont protonix bid in case GERD is playing a role.  -boost tid   Hypokalemia/hypomag  - repleted PRN.  Constipation: Continue bowel regimen.  -Miralax and Senokot-S  DM2: CBGs 90-110 -A1c 6.0 now. Unclear what it was in the past.  -Continue Levemir 5 units  qhs -SSI-s  Hypothyroidism: TSH wnl -Continue home synthroid  Discharge Vitals:   BP 153/68 mmHg  Pulse 71  Temp(Src) 98.1 F (36.7 C) (Oral)  Resp 18  Ht 5\' 3"  (1.6 m)  Wt 166 lb 1.6 oz (75.342 kg)  BMI 29.43 kg/m2  SpO2 96%  Discharge Labs:  Results for orders placed or performed during the hospital encounter of 10/14/15 (from the past 24 hour(s))  Glucose, capillary     Status: Abnormal   Collection Time: 10/23/15 11:27 AM  Result Value Ref Range   Glucose-Capillary 136 (H) 65 - 99 mg/dL  Glucose, capillary     Status: Abnormal   Collection Time: 10/23/15  4:42 PM  Result Value Ref Range   Glucose-Capillary 149 (H) 65 - 99 mg/dL  Basic metabolic panel     Status: Abnormal   Collection Time: 10/23/15  6:37 PM  Result Value Ref Range   Sodium 138 135 - 145 mmol/L   Potassium 2.5 (LL) 3.5 - 5.1 mmol/L   Chloride 101 101 - 111 mmol/L   CO2 24 22 - 32 mmol/L   Glucose, Bld 126 (H) 65 - 99 mg/dL   BUN 11 6 - 20 mg/dL   Creatinine, Ser 0.75 0.44 - 1.00 mg/dL   Calcium 8.4 (L) 8.9 - 10.3 mg/dL   GFR calc non Af Amer >60 >60 mL/min   GFR calc Af Amer >60 >60 mL/min   Anion gap 13 5 - 15  Magnesium     Status: Abnormal   Collection Time: 10/23/15  6:37 PM  Result Value Ref Range   Magnesium 1.4 (L) 1.7 - 2.4 mg/dL  Glucose, capillary     Status: Abnormal   Collection Time: 10/23/15  9:24 PM  Result Value Ref Range   Glucose-Capillary 153 (H) 65 - 99 mg/dL  Glucose, capillary     Status: None   Collection Time: 10/24/15  8:00 AM  Result Value Ref Range   Glucose-Capillary 97 65 - 99 mg/dL    Signed: [name], MD 10/24/2015, 9:04 AM    Services Ordered on Discharge:  Equipment Ordered on Discharge:

## 2015-10-16 NOTE — Evaluation (Signed)
Physical Therapy Evaluation Patient Details Name: Jenna Maldonado MRN: [id number] DOB: 07/18/1950 Today's Date: 10/16/2015   History of Present Illness  Patient is a 65 y/o female with hx of DM and depression presents with dizziness, near syncope and emesis. Recently admitted and d/ced from [facility] with syncope. Noted to have orthostatic hyoptension.  Clinical Impression  Patient presents with symptomatic orthostatic hypotension limiting mobility assessment. Able to perform bed mobility and standing with Mod I-Min guard assist. See vitals below. Supine BP 130/48   75 bpm   98% Sitting BP 114/61   83 bpm   97% Standing BP 67/38  88 bpm 100%  Gait deferred due to safety concerns and dizziness. Would benefit from ace wraps and high ted hose to try to improve orthostatics. RN notified. Pt has been staying with her daughter since recent fall. High fall risk. Will follow acutely to maximize independence/mobility and perform further mobility assessment to determine disposition needs prior to return home.     Follow Up Recommendations Home health PT;Supervision/Assistance - 24 hour    Equipment Recommendations  None recommended by PT    Recommendations for Other Services OT consult     Precautions / Restrictions Precautions Precautions: Fall Precaution Comments: orthostatic hypotension Restrictions Weight Bearing Restrictions: No      Mobility  Bed Mobility Overal bed mobility: Modified Independent             General bed mobility comments: Use of rail for support. + dizziness.  Transfers Overall transfer level: Needs assistance Equipment used: Rolling walker (2 wheeled) Transfers: Sit to/from Stand Sit to Stand: Min guard         General transfer comment: Min guard for safety. + dizziness.  Ambulation/Gait Ambulation/Gait assistance:  (Deferred secondary to symptomatic orthostatic hypotension.)              Stairs            Wheelchair Mobility     Modified Rankin (Stroke Patients Only)       Balance Overall balance assessment: Needs assistance;History of Falls Sitting-balance support: Feet supported;No upper extremity supported Sitting balance-Leahy Scale: Good     Standing balance support: During functional activity Standing balance-Leahy Scale: Fair Standing balance comment: Able to stand statically without UE support. Needs support when dizzy.                             Pertinent Vitals/Pain Pain Assessment: Faces Faces Pain Scale: Hurts little more Pain Location: back  Pain Descriptors / Indicators: Sore;Aching Pain Intervention(s): Monitored during session;Repositioned    Home Living Family/patient expects to be discharged to:: Private residence Living Arrangements: Alone;Children (moved in with daughter recently) Available Help at Discharge: Family Type of Home: House Home Access: Stairs to enter Entrance Stairs-Rails: Right Entrance Stairs-Number of Steps: 1 for her house; 3 steps to daughters house Home Layout: One level Home Equipment: Environmental consultant - 2 wheels;Cane - single point      Prior Function Level of Independence: Independent with assistive device(s)         Comments: Prior to 1 month ago, pt independent. Recently has needed RW for mobilit. Moved in with daughter. Daughter does IADLs.     Hand Dominance        Extremity/Trunk Assessment   Upper Extremity Assessment: Defer to OT evaluation           Lower Extremity Assessment: Generalized weakness  Communication   Communication: No difficulties  Cognition Arousal/Alertness: Awake/alert Behavior During Therapy: WFL for tasks assessed/performed Overall Cognitive Status: Within Functional Limits for tasks assessed                      General Comments General comments (skin integrity, edema, etc.): Family present in room.     Exercises        Assessment/Plan    PT Assessment Patient needs  continued PT services  PT Diagnosis Difficulty walking   PT Problem List Decreased strength;Cardiopulmonary status limiting activity;Pain;Decreased activity tolerance;Decreased balance;Decreased mobility  PT Treatment Interventions Balance training;Gait training;Stair training;Functional mobility training;Therapeutic exercise;Therapeutic activities;Patient/family education   PT Goals (Current goals can be found in the Care Plan section) Acute Rehab PT Goals Patient Stated Goal: to be able to move around and to make this dizziness go ahead PT Goal Formulation: With patient Time For Goal Achievement: 10/30/15 Potential to Achieve Goals: Fair    Frequency Min 3X/week   Barriers to discharge        Co-evaluation               End of Session Equipment Utilized During Treatment: Gait belt Activity Tolerance: Treatment limited secondary to medical complications (Comment) (orthostatic hypotension) Patient left: in bed;with call bell/phone within reach;with family/visitor present Nurse Communication: Mobility status;Other (comment) (need for thigh high ted hose and SCDs)    Functional Assessment Tool Used: clinical judgment Functional Limitation: Changing and maintaining body position Changing and Maintaining Body Position Current Status [id number]): At least 1 percent but less than 20 percent impaired, limited or restricted Changing and Maintaining Body Position Goal Status [id number]): At least 1 percent but less than 20 percent impaired, limited or restricted    Time: 1413-1434 PT Time Calculation (min) (ACUTE ONLY): 21 min   Charges:   PT Evaluation $PT Eval Moderate Complexity: 1 Procedure     PT G Codes:   PT G-Codes **NOT FOR INPATIENT CLASS** Functional Assessment Tool Used: clinical judgment Functional Limitation: Changing and maintaining body position Changing and Maintaining Body Position Current Status [id number]): At least 1 percent but less than 20 percent impaired, limited  or restricted Changing and Maintaining Body Position Goal Status [id number]): At least 1 percent but less than 20 percent impaired, limited or restricted    [place] 10/16/2015, 3:48 PM Wray Kearns, [employer], DPT [phone]

## 2015-10-16 NOTE — Progress Notes (Signed)
  Date: 10/16/2015  Patient name: Jenna Maldonado  Medical record number: [id number]  Date of birth: 1950/11/19   This patient has been seen and the plan of care was discussed with the house staff. Please see Dr. Shanna Cisco note for complete details. I concur with his findings.   MRI given her dizziness, orthostasis and vomiting appear to have had an acute onset about 1 month ago.  She may have an intracranial process ongoing.  I am not clear that her vomiting is associated with her orthostasis as it happens in the mornings, prior to getting up from bed.  She had a severe episode yesterday.  She could be having low blood sugars, or even retained food from the evening prior given her gastroparesis as well.  Increase PPI to BID.    Sid Falcon, MD 10/16/2015, 12:39 PM

## 2015-10-16 NOTE — Progress Notes (Signed)
   Subjective: Patient reports nausea and vomiting yesterday. Not associated with food. Resolved last night. No further nausea or vomiting. Reports dizziness with standing this morning.  Objective: Vital signs in last 24 hours: Filed Vitals:   10/15/15 1300 10/15/15 1425 10/15/15 2230 10/16/15 0523  BP: 182/92  145/74 113/51  Pulse:  76 91 76  Temp:  98 F (36.7 C) 98.5 F (36.9 C) 98.2 F (36.8 C)  TempSrc:      Resp:  17 17 18   Height:      Weight:      SpO2:  97% 97% 96%   Weight change:   Intake/Output Summary (Last 24 hours) at 10/16/15 1130 Last data filed at 10/16/15 0950  Gross per 24 hour  Intake 976.67 ml  Output      0 ml  Net 976.67 ml    General: alert, obese female, cooperative to examination.  Lungs: normal respiratory effort, no accessory muscle use, normal breath sounds, no crackles. Heart: normal rate, regular rhythm, no murmur, no gallop, and no rub.  Abdomen: soft, non-tender, normal bowel sounds, no distention, no guarding, no rebound tenderness Msk: no joint swelling, no joint warmth, and no redness over joints.  Pulses: 2+ DP/PT pulses bilaterally Extremities: pitting edema to mid calf, pressure stockings in place Neurologic: alert & oriented X3, no focal deficits. X3   Medications: I have reviewed the patient's current medications. Scheduled Meds: . carbamazepine  300 mg Oral BID  . enoxaparin (LOVENOX) injection  40 mg Subcutaneous Q24H  . fludrocortisone  0.1 mg Oral Daily  . FLUoxetine  40 mg Oral Daily  . insulin aspart  0-9 Units Subcutaneous TID WC  . insulin detemir  7 Units Subcutaneous Daily  . levothyroxine  50 mcg Oral QAC breakfast  . pantoprazole  40 mg Oral BID  . pravastatin  20 mg Oral Daily   Continuous Infusions: . sodium chloride 100 mL/hr at 10/16/15 1024   PRN Meds:.acetaminophen, ondansetron (ZOFRAN) IV, prochlorperazine Assessment/Plan:  Orthostatic Hypotension: Suspect this is more autonomic dysfunction as  patient is not responding to volume and has other autonomic dysfunctions from her DM including gastroparesis. AM cortisol was normal but aldo/renin still pending. Remains orthostatic this morning. Discussed lifestyle modifications; high salt diet, waist high stockings, standing slowly etc. -aldosterone/renin level pending -continue fludrocoritsone 0.1 mg daily pending adrenal labs -orthostatics qshift -NS 100cc/hr -PT/OT -BMET  Nausea/Vomiting: Patient reports morning nausea/vomiting that started over the past month. Not associate with meals. No dyspepsia. Also reports morning headaches. Unclear if related to her orthostatic hypotension.  -Vit B12 -MRI brain -Protonix bid -Zofran prn  DM2: CBGs well controlled 68-131.  -A1c pending -Levemir 7 units qhs -SSI-s -Continue Reglan  Hypothyroidism: TSH wnl -Continue home synthroid  Dispo: Disposition is deferred at this time, awaiting improvement of current medical problems.  Anticipated discharge in approximately 1-2 day(s).   The patient does have a current PCP Monico Blitz, MD) and does need an [facility] hospital follow-up appointment after discharge.  The patient does not have transportation limitations that hinder transportation to clinic appointments.  .Services Needed at time of discharge: Y = Yes, Blank = No PT:   OT:   RN:   Equipment:   Other:       [name], MD IMTS PGY-1 [phone] 10/16/2015, 11:30 AM

## 2015-10-16 NOTE — Progress Notes (Signed)
PT recommended SCDs and thigh high stockings to help with orthostaic hypotension.  Paged IM resident for orders

## 2015-10-17 DIAGNOSIS — E1143 Type 2 diabetes mellitus with diabetic autonomic (poly)neuropathy: Secondary | ICD-10-CM | POA: Diagnosis not present

## 2015-10-17 DIAGNOSIS — R112 Nausea with vomiting, unspecified: Secondary | ICD-10-CM | POA: Diagnosis not present

## 2015-10-17 DIAGNOSIS — I951 Orthostatic hypotension: Secondary | ICD-10-CM | POA: Diagnosis not present

## 2015-10-17 DIAGNOSIS — K3184 Gastroparesis: Secondary | ICD-10-CM | POA: Diagnosis not present

## 2015-10-17 LAB — GLUCOSE, CAPILLARY
GLUCOSE-CAPILLARY: 165 mg/dL — AB (ref 65–99)
GLUCOSE-CAPILLARY: 210 mg/dL — AB (ref 65–99)
GLUCOSE-CAPILLARY: 170 mg/dL — AB (ref 65–99)
Glucose-Capillary: 156 mg/dL — ABNORMAL HIGH (ref 65–99)

## 2015-10-17 LAB — BASIC METABOLIC PANEL
Anion gap: 15 (ref 5–15)
BUN: 23 mg/dL — ABNORMAL HIGH (ref 6–20)
CALCIUM: 8.7 mg/dL — AB (ref 8.9–10.3)
CO2: 19 mmol/L — ABNORMAL LOW (ref 22–32)
Chloride: 103 mmol/L (ref 101–111)
Creatinine, Ser: 0.77 mg/dL (ref 0.44–1.00)
GFR calc non Af Amer: >60 mL/min (ref >60)
Glucose, Bld: 149 mg/dL — ABNORMAL HIGH (ref 65–99)
Potassium: 4.5 mmol/L (ref 3.5–5.1)
SODIUM: 137 mmol/L (ref 135–145)

## 2015-10-17 LAB — TROPONIN I

## 2015-10-17 MED ORDER — SODIUM CHLORIDE 0.9 % IV SOLN
INTRAVENOUS | Status: AC
  Administered 2015-10-17: 17:00:00 via INTRAVENOUS

## 2015-10-17 MED ORDER — CYANOCOBALAMIN 1000 MCG/ML IJ SOLN
1000.0000 ug | Freq: Once | INTRAMUSCULAR | Status: AC
  Administered 2015-10-17: 1000 ug via INTRAMUSCULAR
  Filled 2015-10-17: qty 1

## 2015-10-17 MED ORDER — ERYTHROMYCIN BASE 250 MG PO TABS
250.0000 mg | ORAL_TABLET | Freq: Three times a day (TID) | ORAL | Status: DC
  Administered 2015-10-17 – 2015-10-18 (×7): 250 mg via ORAL
  Filled 2015-10-17 (×9): qty 1

## 2015-10-17 MED ORDER — SODIUM CHLORIDE 0.9 % IV BOLUS (SEPSIS)
1000.0000 mL | Freq: Once | INTRAVENOUS | Status: AC
  Administered 2015-10-17: 1000 mL via INTRAVENOUS

## 2015-10-17 NOTE — Progress Notes (Signed)
Subjective Nursing staff paged me after patient had an assisted fall at 2123. I spoke to nurse Jenna Maldonado who told me patient had a syncopal episode and slowly fell forward while sitting on the bedside commode. Ms. Jenna Maldonado was assessing patient's lung sounds at that time and assisted the patient to the floor. No known injuries. CBG was checked by nursing staff and was 174 at that time. I saw and evaluated the patient immediately. Patient told me she was feeling well and did not have problems when she got up from the bed to use the bedside commode. States she "blacked out" as soon as she began to urinate. Denies having any CP, palpitations, SOB, lightheadedness/ dizziness, nausea, or vomiting during, prior, or after this episode. Denies having headaches. No witnessed seizure activity during this episode. Patient denies any prior history of seizures. Patient denies injuring herself anywhere or hitting her head during this episode. States she is feeling well at this time and has no other complaints.   Objective Orthostatics: supine (BP 111/55, HR 89), sitting (BP 87/47, HR 94), standing (BP 78/50, HR 95)  Physical exam: GENERAL: resting comfortably in hospital bed, no acute distress  HEENT: Head [place]/AT. PERRLA, EOMI.  CVS: RRR, S1 and S2 appeciated. No m/r/g. PULM: Lungs CTAB. No wheezes rales or rhonchi. ABDOMEN: Soft, non-tender, non-distended. +BS.  NEURO: AAO x3, CN II-XII grossly intact, strength and sensation grossly intact in bilateral upper and lower extremities.   CBG 174  STAT 12 lead EKG: RBBB, similar to prior tracing from 10/15/15. No acute ST/ T wave changes noted.   Assessment and Plan Patient's current presentation is likely 2/2 orthostatic hypotension as orthostatics were positive. I was present in the room when orthostatics were done and patient reported feeling a little lightheaded at that time. Denied having any  CP, palpitations, SOB, or nausea. She is currently on NS@ 75 cc/hr  infusion but does not seem to be responding to volume, as such, autonomic dysfunction is also a possibility. EKG similar to prior tracing from 10/15/15. No acute ST/ T wave changes. No arrhythmia noted on telemetry. STAT troponin <0.03. As such, ACS is less likely. Patient is AAO x3 and neuro exam non-focal, as such, will hold off ordering any imaging of her head at this time. However, I did request nursing staff to inform me if they notice any changes in her mental status.  -Bolus of 1L NS -Increase rate of NS to 100 cc/hr -Recheck orthostatics after IVF bolus -Continue fludrocortisone 0.1 mg daily as adrenal labs are pending  -Orthostatic vitals checked  -CBG checked -STAT EKG checked  -Troponin checked  -Fall precautions  -Continue to monitor on telemetry

## 2015-10-17 NOTE — Progress Notes (Signed)
Patient had assisted fall at 2123. Patient sitting on [facility] RN in room assessing patient's lung sounds and patient had syncopal episode and slowly fell forward. RN assisted patient to floor. Point of contact were knees. No known injuries; Daughter notified; MD notified; Fall huddle performed at bedside. Patient reassessed. Neurologically intact; VSS. MD currently at bedside will continue to monitor.

## 2015-10-17 NOTE — Progress Notes (Signed)
Subjective: Patient reports nausea and vomiting yesterday. Not associated with food. Resolved last night. No further nausea or vomiting. Reports dizziness with standing this morning.  Objective: Vital signs in last 24 hours: Filed Vitals:   10/16/15 0523 10/16/15 1500 10/16/15 2200 10/17/15 0533  BP: 113/51 115/58 132/55 158/80  Pulse: 76 76 71 84  Temp: 98.2 F (36.8 C) 98.6 F (37 C) 97.9 F (36.6 C) 98.4 F (36.9 C)  TempSrc:  Oral Oral Oral  Resp: 18 20 20 18   Height:      Weight:      SpO2: 96% 98% 97% 96%   Weight change:   Intake/Output Summary (Last 24 hours) at 10/17/15 1118 Last data filed at 10/17/15 0900  Gross per 24 hour  Intake    698 ml  Output      0 ml  Net    698 ml    General: alert, obese female, cooperative to examination.  Lungs: normal respiratory effort, no accessory muscle use, normal breath sounds, no crackles. Heart: normal rate, regular rhythm, no murmur, no gallop, and no rub.  Abdomen: soft, non-tender, normal bowel sounds, no distention, no guarding, no rebound tenderness Msk: no joint swelling, no joint warmth, and no redness over joints.  Pulses: 2+ DP/PT pulses bilaterally Extremities: pitting edema to mid calf, pressure stockings in place Neurologic: alert & oriented X3, no focal deficits. X3   Medications: I have reviewed the patient's current medications. Scheduled Meds: . carbamazepine  300 mg Oral BID  . enoxaparin (LOVENOX) injection  40 mg Subcutaneous Q24H  . fludrocortisone  0.1 mg Oral Daily  . FLUoxetine  40 mg Oral Daily  . insulin aspart  0-9 Units Subcutaneous TID WC  . insulin detemir  7 Units Subcutaneous Daily  . levothyroxine  50 mcg Oral QAC breakfast  . pantoprazole  40 mg Oral BID  . pravastatin  20 mg Oral Daily   Continuous Infusions:   PRN Meds:.acetaminophen, ondansetron (ZOFRAN) IV, prochlorperazine Assessment/Plan:  Orthostatic Hypotension: Suspect this is more autonomic dysfunction as patient is  not responding to volume and has other autonomic dysfunctions from her DM including gastroparesis. AM cortisol was normal but aldo/renin still pending. B12 was low normal but that wouldn't cause orthostatic hypotension. Continues to have nausea which would make it hard to keep her fluid intake at normal to help with the [facility]. Discussed lifestyle modifications; high salt diet, waist high stockings, standing slowly etc. -aldosterone/renin level pending -continue fludrocoritsone 0.1 mg daily pending adrenal labs -orthostatics qshift -NS 100cc/hr -PT/OT --> home health PT.  - will give vitamin B12 injection today to help in case this is contributing to her dizziness/gait instability.   Nausea/Vomiting: unclear etiology. reports morning nausea/vomiting that started over the past 2-3 months. Now it's worse for last few days. Not associate with meals. No dyspepsia. Also reports morning headaches. Unclear if related to her orthostatic hypotension. MRI brain was obtained to rule out mass or other lesions. It was normal. This could very likely be from her diabetic gastroparesis. Had no improvement with Reglan. - will do a trial of erythromycin base 250mg  TID + cont PRN compazine.  -cont protonix bid in case GERD is playing a role.   DM2: CBGs well controlled 68-131.  -A1c 6.0 now. Unclear what it was in the past.  -Levemir 7 units qhs -SSI-s  Hypothyroidism: TSH wnl -Continue home synthroid  Dispo: Disposition is deferred at this time, awaiting improvement of current medical problems.  Anticipated discharge  in approximately 1-2 day(s).   The patient does have a current PCP Monico Blitz, MD) and does need an [facility] hospital follow-up appointment after discharge.  The patient does not have transportation limitations that hinder transportation to clinic appointments.  .Services Needed at time of discharge: Y = Yes, Blank = No PT:   OT:   RN:   Equipment:   Other:       Jenna Nims,  MD I 10/17/2015, 11:18 AM

## 2015-10-17 NOTE — Progress Notes (Signed)
Attempted to ambulate pt without success. Pt is very nauseated and weak today. Still experiencing hypotension when trying to stand and got very dizzy and light-headed. O2 sats while resting 97%

## 2015-10-17 NOTE — Progress Notes (Signed)
Patient seen and examined. Case d/w residents in detail. I agree with findings and plan as documented in Dr. Brandt Loosen note.

## 2015-10-17 NOTE — Progress Notes (Signed)
   10/17/15 2122  What Happened  Was fall witnessed? Yes  Who witnessed fall? Vassie Moselle, RN  Patients activity before fall bathroom-assisted  Point of contact other (comment) (knee)  Was patient injured? No  Follow Up  MD notified Ratahran  Time MD notified 2135  Family notified Yes-comment (Daughter)  Time family notified 2135  Additional tests No  Progress note created (see row info) Yes  Adult Fall Risk Assessment  Risk Factor Category (scoring not indicated) High fall risk per protocol (document High fall risk);Fall has occurred during this admission (document High fall risk);History of more than one fall within 6 months before admission (document High fall risk)  Patient's Fall Risk High Fall Risk (>13 points)  Adult Fall Risk Interventions  Required Bundle Interventions *See Row Information* High fall risk - low, moderate, and high requirements implemented  Additional Interventions Fall risk signage  Fall with Injury Screening  Risk For Fall Injury- See Row Information  Nurse judgement;F  Intervention(s) for 2 or more risk criteria identified Specialty Low Bed;Floor Mat;Gait Belt  Vitals  Temp 98.5 F (36.9 C)  Temp Source Oral  BP (!) 111/55 mmHg  MAP (mmHg) 68  BP Location Left Arm  BP Method Automatic  Patient Position (if appropriate) Sitting  Pulse Rate 89  Pulse Rate Source Monitor  Resp 18  Oxygen Therapy  SpO2 96 %  O2 Device Room Air  Pain Assessment  Pain Assessment No/denies pain  Pain Score 0

## 2015-10-18 LAB — GLUCOSE, CAPILLARY
GLUCOSE-CAPILLARY: 101 mg/dL — AB (ref 65–99)
GLUCOSE-CAPILLARY: 116 mg/dL — AB (ref 65–99)
GLUCOSE-CAPILLARY: 111 mg/dL — AB (ref 65–99)
Glucose-Capillary: 116 mg/dL — ABNORMAL HIGH (ref 65–99)

## 2015-10-18 NOTE — Progress Notes (Signed)
Subjective: Patient had syncopal episode overnight. Reportedly got up to use the bedside commode and passed out after sitting down to urinate. RN was present during event. Reported that patient sat at edge of bed for several minute before getting up to go to bedside commode. CBG was 174 after event. Patient denies any symptoms prior to episode but does report feeling dizzy with standing when checking orthostatics afterwards. This morning, she denies any symptoms. No lightheadedness, dizziness, cp, sob. Does still endorse some nausea but improved from prior. No episodes of emesis since Friday.   Objective: Vital signs in last 24 hours: Filed Vitals:   10/17/15 2153 10/18/15 0138 10/18/15 0644 10/18/15 0800  BP: 78/50 114/47 121/56   Pulse: 95 74 77   Temp:   98.2 F (36.8 C)   TempSrc:   Oral   Resp:  20 18   Height:      Weight:    179 lb 4.8 oz (81.33 kg)  SpO2: 98% 98% 97%    Weight change:   Intake/Output Summary (Last 24 hours) at 10/18/15 1010 Last data filed at 10/17/15 2100  Gross per 24 hour  Intake   92.5 ml  Output    200 ml  Net -107.5 ml    General: alert, obese female, cooperative to examination.  Lungs: normal respiratory effort, no accessory muscle use, normal breath sounds, no crackles. Heart: normal rate, regular rhythm, no murmur, no gallop, and no rub.  Abdomen: soft, non-tender, normal bowel sounds, no distention, no guarding, no rebound tenderness Msk: no joint swelling, no joint warmth, and no redness over joints.  Pulses: 2+ DP/PT pulses bilaterally Extremities: pitting edema to mid calf, pressure stockings in place to knees (need to get new thigh high stockings) Neurologic: alert & oriented X3, no focal deficits.   Medications: I have reviewed the patient's current medications. Scheduled Meds: . carbamazepine  300 mg Oral BID  . enoxaparin (LOVENOX) injection  40 mg Subcutaneous Q24H  . erythromycin  250 mg Oral TID WC & HS  . fludrocortisone  0.1  mg Oral Daily  . FLUoxetine  40 mg Oral Daily  . insulin aspart  0-9 Units Subcutaneous TID WC  . insulin detemir  7 Units Subcutaneous Daily  . levothyroxine  50 mcg Oral QAC breakfast  . pantoprazole  40 mg Oral BID  . pravastatin  20 mg Oral Daily   Continuous Infusions:   PRN Meds:.acetaminophen, ondansetron (ZOFRAN) IV, prochlorperazine Assessment/Plan:  Orthostatic Hypotension: Suspect this is more autonomic dysfunction as patient is not responding to volume and has other autonomic dysfunctions from her DM including gastroparesis. AM cortisol was normal but aldo/renin still pending. B12 was low normal but that wouldn't cause orthostatic hypotension. Nausea improving. Discussed lifestyle modifications; high salt diet, waist high stockings, standing slowly etc. -aldosterone/renin level pending -continue fludrocoritsone 0.1 mg daily pending adrenal labs -orthostatics qshift -NS 100cc/hr -PT/OT --> home health PT.  -s/p B12 injection to help in case this is contributing to her dizziness/gait instability -MMA pending  Nausea/Vomiting: unclear etiology. reports morning nausea/vomiting that started over the past 2-3 months. Now it's worse for last few days. Not associate with meals. No dyspepsia. Also reports morning headaches. Unclear if related to her orthostatic hypotension. MRI brain was obtained to rule out mass or other lesions. It was normal. Nausea somewhat improved with no further episodes of emesis.  This could very likely be from her diabetic gastroparesis. Had no improvement with Reglan. - will do a trial  of erythromycin base 250mg  TID + cont PRN compazine.  -cont protonix bid in case GERD is playing a role.   DM2: CBGs 101-170.  -A1c 6.0 now. Unclear what it was in the past.  -Levemir 7 units qhs -SSI-s  Hypothyroidism: TSH wnl -Continue home synthroid  Dispo: Disposition is deferred at this time, awaiting improvement of current medical problems.  Anticipated discharge  in approximately 1-2 day(s).   The patient does have a current PCP Monico Blitz, MD) and does need an [facility] hospital follow-up appointment after discharge.  The patient does not have transportation limitations that hinder transportation to clinic appointments.  .Services Needed at time of discharge: Y = Yes, Blank = No PT:   OT:   RN:   Equipment:   Other:       [name], MD I 10/18/2015, 10:10 AM

## 2015-10-19 ENCOUNTER — Inpatient Hospital Stay (HOSPITAL_COMMUNITY): Payer: Medicare Other

## 2015-10-19 DIAGNOSIS — E1143 Type 2 diabetes mellitus with diabetic autonomic (poly)neuropathy: Secondary | ICD-10-CM | POA: Diagnosis present

## 2015-10-19 DIAGNOSIS — R112 Nausea with vomiting, unspecified: Secondary | ICD-10-CM | POA: Diagnosis not present

## 2015-10-19 DIAGNOSIS — R110 Nausea: Secondary | ICD-10-CM | POA: Diagnosis not present

## 2015-10-19 DIAGNOSIS — G908 Other disorders of autonomic nervous system: Secondary | ICD-10-CM | POA: Diagnosis not present

## 2015-10-19 DIAGNOSIS — Z79899 Other long term (current) drug therapy: Secondary | ICD-10-CM | POA: Diagnosis not present

## 2015-10-19 DIAGNOSIS — K219 Gastro-esophageal reflux disease without esophagitis: Secondary | ICD-10-CM | POA: Diagnosis present

## 2015-10-19 DIAGNOSIS — E039 Hypothyroidism, unspecified: Secondary | ICD-10-CM | POA: Diagnosis present

## 2015-10-19 DIAGNOSIS — E1165 Type 2 diabetes mellitus with hyperglycemia: Secondary | ICD-10-CM | POA: Diagnosis present

## 2015-10-19 DIAGNOSIS — I951 Orthostatic hypotension: Secondary | ICD-10-CM | POA: Diagnosis present

## 2015-10-19 DIAGNOSIS — Z794 Long term (current) use of insulin: Secondary | ICD-10-CM | POA: Diagnosis not present

## 2015-10-19 DIAGNOSIS — K5900 Constipation, unspecified: Secondary | ICD-10-CM | POA: Diagnosis present

## 2015-10-19 DIAGNOSIS — K3184 Gastroparesis: Secondary | ICD-10-CM | POA: Diagnosis present

## 2015-10-19 DIAGNOSIS — E279 Disorder of adrenal gland, unspecified: Secondary | ICD-10-CM | POA: Diagnosis present

## 2015-10-19 DIAGNOSIS — Z881 Allergy status to other antibiotic agents status: Secondary | ICD-10-CM | POA: Diagnosis not present

## 2015-10-19 DIAGNOSIS — R339 Retention of urine, unspecified: Secondary | ICD-10-CM | POA: Diagnosis not present

## 2015-10-19 DIAGNOSIS — F329 Major depressive disorder, single episode, unspecified: Secondary | ICD-10-CM | POA: Diagnosis present

## 2015-10-19 DIAGNOSIS — Z9049 Acquired absence of other specified parts of digestive tract: Secondary | ICD-10-CM | POA: Diagnosis not present

## 2015-10-19 DIAGNOSIS — Z888 Allergy status to other drugs, medicaments and biological substances status: Secondary | ICD-10-CM | POA: Diagnosis not present

## 2015-10-19 DIAGNOSIS — E876 Hypokalemia: Secondary | ICD-10-CM | POA: Diagnosis not present

## 2015-10-19 DIAGNOSIS — I4510 Unspecified right bundle-branch block: Secondary | ICD-10-CM | POA: Diagnosis present

## 2015-10-19 DIAGNOSIS — G20 Parkinson's disease: Secondary | ICD-10-CM | POA: Diagnosis present

## 2015-10-19 DIAGNOSIS — Z885 Allergy status to narcotic agent status: Secondary | ICD-10-CM | POA: Diagnosis not present

## 2015-10-19 LAB — GLUCOSE, CAPILLARY
GLUCOSE-CAPILLARY: 93 mg/dL (ref 65–99)
Glucose-Capillary: 120 mg/dL — ABNORMAL HIGH (ref 65–99)
Glucose-Capillary: 137 mg/dL — ABNORMAL HIGH (ref 65–99)

## 2015-10-19 MED ORDER — ERYTHROMYCIN ETHYLSUCCINATE 400 MG/5ML PO SUSR
400.0000 mg | Freq: Three times a day (TID) | ORAL | Status: DC
  Administered 2015-10-19 – 2015-10-21 (×7): 400 mg via ORAL
  Filled 2015-10-19 (×13): qty 5

## 2015-10-19 MED ORDER — FLUDROCORTISONE ACETATE 0.1 MG PO TABS
0.2000 mg | ORAL_TABLET | Freq: Every day | ORAL | Status: DC
  Administered 2015-10-20: 0.2 mg via ORAL
  Filled 2015-10-19: qty 2

## 2015-10-19 MED ORDER — SODIUM CHLORIDE 0.9 % IV SOLN
INTRAVENOUS | Status: DC
Start: 2015-10-19 — End: 2015-10-24
  Administered 2015-10-19 – 2015-10-20 (×3): via INTRAVENOUS
  Administered 2015-10-21: 1000 mL via INTRAVENOUS
  Administered 2015-10-22 – 2015-10-24 (×3): via INTRAVENOUS

## 2015-10-19 MED ORDER — FLUDROCORTISONE ACETATE 0.1 MG PO TABS
0.1000 mg | ORAL_TABLET | Freq: Once | ORAL | Status: AC
  Administered 2015-10-19: 0.1 mg via ORAL
  Filled 2015-10-19: qty 1

## 2015-10-19 MED ORDER — SENNOSIDES-DOCUSATE SODIUM 8.6-50 MG PO TABS
2.0000 | ORAL_TABLET | Freq: Two times a day (BID) | ORAL | Status: DC
  Administered 2015-10-19 – 2015-10-24 (×10): 2 via ORAL
  Filled 2015-10-19 (×11): qty 2

## 2015-10-19 MED ORDER — POLYETHYLENE GLYCOL 3350 17 G PO PACK
17.0000 g | PACK | Freq: Every day | ORAL | Status: DC
  Administered 2015-10-19 – 2015-10-24 (×6): 17 g via ORAL
  Filled 2015-10-19 (×5): qty 1

## 2015-10-19 NOTE — Progress Notes (Addendum)
CSW informed of potential need for SNF placement due to pt decline and orthostatic hypotension which is restricting mobility.  CSW spoke with pt dtrs at bedside about MD concerns with return home.  Dtrs both tearful at bedside- very upset about pt decline and unsure what is wrong with pt- have lots of concerns about pt inability to eat and MD not being able to specify why.  CSW discussed SNF in depth and stated that if hypotension continues to prohibit participating in therapy that insurance would no longer pay for SNF stay.  Dtrs discussed and would like to take pt home- dtr states she is a CNA and can be with her all day- also states they know another CNA that can help with pt care.  Pt was working with [facility] before admission- would like to continue with them for home services- interested in hospital bed  CSW informed RNCM of pt and pt dtrs choice to return home  CSW signing off  Jenna Maldonado, [place] Worker [phone]

## 2015-10-19 NOTE — Progress Notes (Signed)
Physical Therapy Treatment Patient Details Name: Jenna Maldonado MRN: [id number] DOB: 1951-06-13 Today's Date: 10/19/2015    History of Present Illness Patient is a 65 y/o female with hx of DM and depression presents with dizziness, near syncope and emesis. Recently admitted and d/ced from [facility] with syncope. Noted to have orthostatic hyoptension.    PT Comments    Mobility continues to be limited today secondary to symptomatic orthostatic hypotension. BP sitting EOB 88/44, not able to take it in standing due to level of support needed and episode of not responding to questions, trembling and diaphoresis so returned pt to supine. Suspect BP dropped. BP in supine 130/62. Pt now requires Mod A to get to EOB. Lengthy discussion with pt and family about safety at home and because of circumstances, pt is a high fall risk. Education re: waiting a few minutes after change in position, not getting up without assist, using BSC and trying to sit up numerous times during the day as well as exercises to perform at home to improve circulation and hypotension. Family/pt agree to discharge home with cousin who will provide 24/7 S/assist and is a CNA. Pt would benefit from hospital bed, ambulance transport home (4 steps to enter) and to maximize home health services-HHPT, HHOT and an aide. Will follow.  Follow Up Recommendations  Home health PT;Supervision/Assistance - 24 hour     Equipment Recommendations  Hospital bed (ambulance transport home)    Recommendations for Other Services OT consult     Precautions / Restrictions Precautions Precautions: Fall Precaution Comments: orthostatic hypotension Restrictions Weight Bearing Restrictions: No    Mobility  Bed Mobility Overal bed mobility: Needs Assistance Bed Mobility: Rolling;Sidelying to Sit Rolling: Supervision Sidelying to sit: Mod assist;HOB elevated       General bed mobility comments: Assist to elevate trunk to get to EOB. Use of rail for  support. + dizziness.  Transfers Overall transfer level: Needs assistance Equipment used: Rolling walker (2 wheeled) Transfers: Sit to/from Stand Sit to Stand: Min assist         General transfer comment: Min A to boost from EOB with cues for hand placement/technique. + dizziness. After 30 sec, pt with eyes glazed over, stopped responding to questions with LOB backwards and lowered pt back into bed. Posterior bias and bil knee trembling during episode.  Ambulation/Gait Ambulation/Gait assistance:  (Deferred secondary to above abnd safety concerns.)               Stairs            Wheelchair Mobility    Modified Rankin (Stroke Patients Only)       Balance Overall balance assessment: Needs assistance Sitting-balance support: Feet supported;Single extremity supported Sitting balance-Leahy Scale: Fair Sitting balance - Comments: Min guard but when fatigued requires Min A for sitting balance with UEsupport.    Standing balance support: During functional activity Standing balance-Leahy Scale: Poor Standing balance comment: Reliant on BUEs for support.                     Cognition Arousal/Alertness: Awake/alert Behavior During Therapy: Flat affect Overall Cognitive Status: Within Functional Limits for tasks assessed                      Exercises      General Comments General comments (skin integrity, edema, etc.): Family present in room.      Pertinent Vitals/Pain Pain Assessment: Faces Faces Pain Scale: Hurts a little  bit Pain Location: head Pain Descriptors / Indicators: Sore Pain Intervention(s): Monitored during session;Repositioned    Home Living                      Prior Function            PT Goals (current goals can now be found in the care plan section) Progress towards PT goals: Progressing toward goals    Frequency  Min 3X/week    PT Plan Current plan remains appropriate    Co-evaluation              End of Session Equipment Utilized During Treatment: Gait belt Activity Tolerance: Treatment limited secondary to medical complications (Comment) (dizzy) Patient left: in bed;with call bell/phone within reach;with SCD's reapplied     Time: [id number] PT Time Calculation (min) (ACUTE ONLY): 44 min  Charges:  $Therapeutic Activity: 23-37 mins $Self Care/Home Management: 8-22                    G Codes:      Scout Guyett A Croy Drumwright 10/19/2015, 3:56 PM Wray Kearns, PT, DPT [phone]

## 2015-10-19 NOTE — Progress Notes (Signed)
  Date: 10/19/2015  Patient name: Jenna Maldonado  Medical record number: [id number]  Date of birth: 1950-08-25   This patient has been seen and the plan of care was discussed with the house staff. Please see Dr. Shanna Cisco note for complete details. I concur with his findings.   Unfortunately, no obvious cause has presented itself for her orthostasis.  I suspect this is due to autonomic dysfunction given her DM and related neuropathy and gastroparesis.  She may not get much relief from medication.  For her nausea/vomiting, we will try prokinetic erythromcycin for short course.   Sid Falcon, MD 10/19/2015, 3:27 PM

## 2015-10-19 NOTE — Progress Notes (Signed)
Subjective: No further syncopal episodes. Remains orthostatic. Difficulty working with PT due to orthostasis. Reports continued nausa. Some emesis this morning. Reports constipation. Last BM Friday. Family concerned stating only 1 BM in past 3 weeks. Patient is passing gas. CT scan on admission 5/3 with large stool burden but no obstruction.   Objective: Vital signs in last 24 hours: Filed Vitals:   10/18/15 1341 10/18/15 2252 10/19/15 0516 10/19/15 1403  BP: 171/68 102/54 155/61 140/67  Pulse: 89 104 87 92  Temp: 98.7 F (37.1 C) 98.6 F (37 C) 98.6 F (37 C) 99.2 F (37.3 C)  TempSrc: Oral Oral Oral Oral  Resp: 20 16 16 20   Height:      Weight:      SpO2: 98% 98% 98% 99%   Weight change:   Intake/Output Summary (Last 24 hours) at 10/19/15 1644 Last data filed at 10/19/15 0626  Gross per 24 hour  Intake    100 ml  Output      0 ml  Net    100 ml    General: alert, obese female, cooperative to examination.  Lungs: normal respiratory effort, no accessory muscle use, normal breath sounds, no crackles. Heart: normal rate, regular rhythm, no murmur, no gallop, and no rub.  Abdomen: soft, non-tender, normal bowel sounds, no distention, no guarding, no rebound tenderness Msk: no joint swelling, no joint warmth, and no redness over joints.  Pulses: 2+ DP/PT pulses bilaterally Extremities: pitting edema to mid calf, pressure stockings in place Neurologic: alert & oriented X3, no focal deficits.   Medications: I have reviewed the patient's current medications. Scheduled Meds: . carbamazepine  300 mg Oral BID  . enoxaparin (LOVENOX) injection  40 mg Subcutaneous Q24H  . erythromycin  400 mg Oral TID AC & HS  . [START ON 10/20/2015] fludrocortisone  0.2 mg Oral Daily  . FLUoxetine  40 mg Oral Daily  . insulin aspart  0-9 Units Subcutaneous TID WC  . insulin detemir  7 Units Subcutaneous Daily  . levothyroxine  50 mcg Oral QAC breakfast  . pantoprazole  40 mg Oral BID  .  polyethylene glycol  17 g Oral Daily  . pravastatin  20 mg Oral Daily  . senna-docusate  2 tablet Oral BID   Continuous Infusions: . 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 AM cortisol was normal but aldo/renin still pending (lab reports results will be back Wednesday-Thursday). Discussed lifestyle modifications; high salt diet, waist high stockings, standing slowly etc. -aldosterone/renin level pending -increase fludrocoritsone 0.2 mg daily pending adrenal labs -orthostatics qshift -NS 100cc/hr -PT/OT --> home health PT.  -s/p B12 injection to help in case this is contributing to her dizziness/gait instability -MMA pending  Nausea/Vomiting: unclear etiology. reports morning nausea/vomiting that started over the past 2-3 months. Now it's worse for last few days. Not associate with meals. No dyspepsia. Also reports morning headaches. Unclear if related to her orthostatic hypotension. MRI brain was obtained to rule out mass or other lesions. It was normal. Nausea somewhat improved with no further episodes of emesis.  This could very likely be from her diabetic gastroparesis. Had no improvement with Reglan. - will do a trial of erythromycin base 250mg  TID + cont PRN compazine.  -cont protonix bid in case GERD is playing a role.  Constipation: Reports 1 BM in past 3 weeks. CT scan 5/3 shows large stool burden but no obstruction. Is passing gas.  -Miralax and Senokot-S -May need enemia if no improvement  -Checking KUB  DM2: CBGs 93-120  -A1c 6.0 now. Unclear what it was in the past.  -Levemir 7 units qhs -SSI-s  Hypothyroidism: TSH wnl -Continue home synthroid  Dispo: Disposition is deferred at this time, awaiting improvement of current  medical problems.  Anticipated discharge in approximately 1-2 day(s).   The patient does have a current PCP Monico Blitz, MD) and does need an [facility] hospital follow-up appointment after discharge.  The patient does not have transportation limitations that hinder transportation to clinic appointments.  .Services Needed at time of discharge: Y = Yes, Blank = No PT:   OT:   RN:   Equipment:   Other:     LOS: 0 days   [name], MD I 10/19/2015, 4:44 PM

## 2015-10-20 ENCOUNTER — Inpatient Hospital Stay (HOSPITAL_COMMUNITY): Payer: Medicare Other

## 2015-10-20 DIAGNOSIS — R339 Retention of urine, unspecified: Secondary | ICD-10-CM

## 2015-10-20 DIAGNOSIS — K5900 Constipation, unspecified: Secondary | ICD-10-CM

## 2015-10-20 LAB — BASIC METABOLIC PANEL
Anion gap: 16 — ABNORMAL HIGH (ref 5–15)
BUN: 21 mg/dL — AB (ref 6–20)
CALCIUM: 8.7 mg/dL — AB (ref 8.9–10.3)
CHLORIDE: 102 mmol/L (ref 101–111)
CO2: 19 mmol/L — ABNORMAL LOW (ref 22–32)
CREATININE: 0.79 mg/dL (ref 0.44–1.00)
GFR calc non Af Amer: >60 mL/min (ref >60)
Glucose, Bld: 125 mg/dL — ABNORMAL HIGH (ref 65–99)
Potassium: 3.5 mmol/L (ref 3.5–5.1)
SODIUM: 137 mmol/L (ref 135–145)

## 2015-10-20 LAB — CBC
HEMATOCRIT: 39.5 % (ref 36.0–46.0)
Hemoglobin: 14 g/dL (ref 12.0–15.0)
MCH: 30 pg (ref 26.0–34.0)
MCHC: 35.4 g/dL (ref 30.0–36.0)
MCV: 84.6 fL (ref 78.0–100.0)
PLATELETS: 224 10*3/uL (ref 150–400)
RBC: 4.67 MIL/uL (ref 3.87–5.11)
RDW: 12.4 % (ref 11.5–15.5)
WBC: 9 10*3/uL (ref 4.0–10.5)

## 2015-10-20 LAB — URINE MICROSCOPIC-ADD ON

## 2015-10-20 LAB — GLUCOSE, CAPILLARY
GLUCOSE-CAPILLARY: 113 mg/dL — AB (ref 65–99)
GLUCOSE-CAPILLARY: 125 mg/dL — AB (ref 65–99)
GLUCOSE-CAPILLARY: 146 mg/dL — AB (ref 65–99)
GLUCOSE-CAPILLARY: 122 mg/dL — AB (ref 65–99)
Glucose-Capillary: 92 mg/dL (ref 65–99)

## 2015-10-20 LAB — URINALYSIS, ROUTINE W REFLEX MICROSCOPIC
HGB URINE DIPSTICK: NEGATIVE
Leukocytes, UA: NEGATIVE
NITRITE: NEGATIVE
PH: 5.5 (ref 5.0–8.0)
Protein, ur: NEGATIVE mg/dL
SPECIFIC GRAVITY, URINE: 1.03 (ref 1.005–1.030)

## 2015-10-20 MED ORDER — MIDODRINE HCL 5 MG PO TABS
10.0000 mg | ORAL_TABLET | Freq: Three times a day (TID) | ORAL | Status: DC
  Administered 2015-10-20: 10 mg via ORAL
  Filled 2015-10-20 (×2): qty 2

## 2015-10-20 MED ORDER — FLUDROCORTISONE ACETATE 0.1 MG PO TABS
0.1000 mg | ORAL_TABLET | Freq: Every day | ORAL | Status: DC
  Administered 2015-10-21 – 2015-10-22 (×2): 0.1 mg via ORAL
  Filled 2015-10-20 (×2): qty 1

## 2015-10-20 MED ORDER — MIDODRINE HCL 5 MG PO TABS: 5.0000 mg | ORAL_TABLET | Freq: Three times a day (TID) | ORAL | Status: DC

## 2015-10-20 MED ORDER — MIDODRINE HCL 5 MG PO TABS: 10.0000 mg | ORAL_TABLET | Freq: Three times a day (TID) | ORAL | Status: DC

## 2015-10-20 NOTE — Progress Notes (Signed)
RT called to pts bedside - RRT called -Code stroke called. MD and RN at bedside. Pt on RA sats 97, HR 94, BBS clear shallow -MD aware. No further orders for RT at this time

## 2015-10-20 NOTE — Progress Notes (Signed)
Assisted patient to use the [facility] because patient said that she was having burning down there and felt like she needed to pee.  I helped patient sit on the side of the bed and allowed her to sit there for about 3 minutes before trying to stand.  Patient was able to stand with just standby assistance and turn to sit on the [facility].  Patient tried to urinate but she stated that she couldn't do it.  I got a bladder scanner and dynamap while daughter stood with patient.  I scanned patient and could not get a value so i instructed patient that we would get her back to bed and then scan her.  At 1325 patient began to lean to the left side and the left arm dropped to the left side. At 1330 patient was able to say her name and date of birth she could raise the arm but could not hold it up by herself.  Patient could hold her left leg up.  I called Agricultural consultant, Rapid Response RN, and called for a code stroke.  i checked vital signs with blood pressure being 133/118.  Patient at 1340 became unresponsive with voice but had a pulse.  Patient would open her eyes wider with sternal rub.  MD at bedside at 1344 patient still unresponsive.  We got the patient back to bed and patient's color began to return as well as she began to arouse to voice. Code stroke was ruled out.  Received orders for foley insertion.    2:31 PM patient is currently alert and asking what happened.  Family at bedside and talking with patient.  Will continue to monitor patient condition.

## 2015-10-20 NOTE — Progress Notes (Signed)
NT attempted to get orthostatics this morning but patient has been very sleepy and lethargic.  Will try to get orthostatics when patient is more alert and awake.

## 2015-10-20 NOTE — Progress Notes (Signed)
  Date: 10/20/2015  Patient name: Jenna Maldonado  Medical record number: [id number]  Date of birth: [birth year]   This patient has been seen and the plan of care was discussed with the house staff. Please see Dr. Shanna Cisco note for complete details. I concur with his findings.   Updated neuro exam + cogwheeling in upper extremities, strength 5/5 throughout, flat affect, slowed RAM, tremor comes and goes in the lips, + micrographia  Has parkinsonian features: DDx includes PD, PSP, pure autonomic failure.  Change to midodrine today given syncope today.  Monitor for improvement.   Sid Falcon, MD 10/20/2015, 4:09 PM

## 2015-10-20 NOTE — Progress Notes (Signed)
Patient did not tolerate tap water enema very well.  Patient could not hold the water at all.  Patient did have a small BM

## 2015-10-20 NOTE — Progress Notes (Signed)
Subjective: Patient reports nausea improving, no further emesis overnight. Still reports dizziness/lightheadedness when upright. Remains orthostatic. Daughter very concerned. Believes patient is getting worse.  This afternoon, patient was at bedside commode and became lightheaded and had a syncopal episode. Reportedly leaning to the left with left arm dropped to left side. Nurse was able to get the patient to respond and answer questions appropriately at first but patient was unable to raise left arm. Patient became unresponsive and code stroke was called. Patient improved after getting back in bed. Answering questions appropriately, moving all extremities.   Do not believe stroke occurred. Patient likely had syncopal episode with valsalva.  Objective: Vital signs in last 24 hours: Filed Vitals:   10/20/15 1325 10/20/15 1330 10/20/15 1344 10/20/15 1413  BP: 119/71 133/118 128/55 133/59  Pulse: 79 115 87 81  Temp:   99.8 F (37.7 C) 98.1 F (36.7 C)  TempSrc:    Oral  Resp:   20 20  Height:      Weight:      SpO2:   94% 96%   Weight change:   Intake/Output Summary (Last 24 hours) at 10/20/15 1455 Last data filed at 10/20/15 0500  Gross per 24 hour  Intake    390 ml  Output   1500 ml  Net  -1110 ml    General: alert, obese female, cooperative to examination.  Lungs: normal respiratory effort, no accessory muscle use, normal breath sounds, no crackles. Heart: normal rate, regular rhythm, no murmur, no gallop, and no rub.  Abdomen: soft, non-tender, normal bowel sounds, no distention, no guarding, no rebound tenderness Msk: no joint swelling, no joint warmth, and no redness over joints.  Pulses: 2+ DP/PT pulses bilaterally Extremities: pitting edema to mid calf, pressure stockings not in place Neurologic: alert & oriented X3, right arm rigidity, right arm resting tremor. Normal strength throughout. Normal sensation.    Medications: I have reviewed the patient's current  medications. Scheduled Meds: . carbamazepine  300 mg Oral BID  . enoxaparin (LOVENOX) injection  40 mg Subcutaneous Q24H  . erythromycin  400 mg Oral TID AC & HS  . [START ON 10/21/2015] fludrocortisone  0.1 mg Oral Daily  . FLUoxetine  40 mg Oral Daily  . insulin aspart  0-9 Units Subcutaneous TID WC  . insulin detemir  7 Units Subcutaneous Daily  . levothyroxine  50 mcg Oral QAC breakfast  . midodrine  10 mg Oral TID WC  . pantoprazole  40 mg Oral BID  . polyethylene glycol  17 g Oral Daily  . pravastatin  20 mg Oral Daily  . senna-docusate  2 tablet Oral BID   Continuous Infusions: . sodium chloride 100 mL/hr at 10/19/15 1505   PRN Meds:.acetaminophen, ondansetron (ZOFRAN) IV, prochlorperazine Assessment/Plan:  Orthostatic Hypotension: Syncopal episode this afternoon with micturition. Potentially Pure Autonomic Dysfunction vs DM autonomic dysfunction. Spoke with Dr. Leonel Ramsay who evaluated the patient this afternoon following syncopal episode. 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 Tremor on right hand with some rigidity. Family reports changes in handwriting over past year. Likely poor prognosis overall. Advised against Sinemet as it can cause orthostatic hypotension. Will wean off florinef as patient has had no response. Restart midodrine.  -AM cortisol was normal but aldo/renin still pending (lab reports results will be back Wednesday-Thursday).  -Discussed lifestyle modifications; high salt diet, waist high stockings, standing slowly etc. -wean off fludrocortisone -restart midodrine  -bed rest -Getting records from hospitalization at [facility]  Urinary  Retention: Patient now having urinary retention. Had 1L on bladder scan overnight and another 500 mL this morning. Reports being unable to urinate. Syncopal episode when trying to urinate today. Reports burning with urination today. Congruent with her autonomic dysfunction as above.   -foley -u/a  Nausea/Vomiting: unclear etiology: No further emesis. Nausea minimally improved.   This could very likely be from her diabetic gastroparesis. Had no improvement with Reglan. - will do a trial of erythromycin base 250mg  TID + cont PRN compazine.  -cont protonix bid in case GERD is playing a role.   Constipation: Reports 1 BM in past 3 weeks. CT scan 5/3 shows large stool burden but no obstruction. Is passing gas. KUB 5/8 with moderate stool burden, no obstruction or ileus.  -Miralax and Senokot-S -Enema   DM2: CBGs 113-146  -A1c 6.0 now. Unclear what it was in the past.  -Levemir 7 units qhs -SSI-s  Hypothyroidism: TSH wnl -Continue home synthroid  Dispo: Disposition is deferred at this time, awaiting improvement of current medical problems.  Anticipated discharge in approximately 1-2 day(s).   The patient does have a current PCP Monico Blitz, MD) and does need an [facility] hospital follow-up appointment after discharge.  The patient does not have transportation limitations that hinder transportation to clinic appointments.  .Services Needed at time of discharge: Y = Yes, Blank = No PT:   OT:   RN:   Equipment:   Other:     LOS: 1 day   [name], MD I 10/20/2015, 2:55 PM

## 2015-10-20 NOTE — Consult Note (Signed)
Requesting Physician: Dr. Daryll Drown    Chief Complaint: Code stroke   HPI:                                                                                                                                         Jenna Maldonado is an 65 y.o. female with PMH of DM2, gastroparesis, depression, hypothyroidism, GERD who presents with dizziness, near syncope and emesis. She presented to [place] at the time and had a work up for syncope. She was sent home on midodrine. The patient and her daughter report that her symptoms have not improved since being discharged 3 days ago. She continued to have lightheadedness, dizziness and near syncope. Her daughter has been checking her orthostatics and found some significant hypotension on standing ([id number] - 0000000 systolic). She was also orthostatic after fluids in the ED. She was admitted to the floor where she continued to have Orthostatic BP Lying 166/78, sitting 92/50 and standing 72/44.  Today she was placed on the Commode when she slowly leaned to the lft and complained of left arm numbness. She was able to communicated but then transiently became unresponsive for a minute. She was placed back in bed and laid flat where she quickly started to come to. Within 3-4 minutes she was able to follow commands and answer questions. BP after placed back on the bed was [id number] systolic. NIHSS was 0. Code stroke was cancled.   Date last known well: Today Time last known well: Time: 13:20 tPA Given: No: improving   Past Medical History  Diagnosis Date  . Diabetes mellitus without complication ([place])   . Depression     Past Surgical History  Procedure Laterality Date  . Shoulder surgery Left   . Tubal ligation    . Hand surgery Bilateral 2001  . Eye surgrey Bilateral     4-5 times   . Carpel tunnel    . Cholecystectomy      June 2016    Family History  Problem Relation Age of Onset  . Cancer Mother   . Suicidality Father     GSW   Social History:  reports  that she has never smoked. She has never used smokeless tobacco. She reports that she does not drink alcohol or use illicit drugs.  Allergies:  Allergies  Allergen Reactions  . Atorvastatin Other (See Comments)    intolerance  . Ciprofloxacin Itching  . Codeine Nausea And Vomiting  . Flagyl [Metronidazole] Itching  . Hydrocodone Nausea And Vomiting  . Pioglitazone Other (See Comments)    Weight gain  . Sulfa Antibiotics Other (See Comments)    intolerance    Medications:  Prior to Admission:  Prescriptions prior to admission  Medication Sig Dispense Refill Last Dose  . ACCU-CHEK SMARTVIEW test strip Reported on 05/27/2015   10/14/2015 at Unknown time  . carbamazepine (TEGRETOL XR) 100 MG 12 hr tablet Take 300 mg by mouth 2 (two) times daily.    10/13/2015 at Unknown time  . FLUoxetine (PROZAC) 40 MG capsule Take 40 mg by mouth daily.    10/13/2015 at Unknown time  . glimepiride (AMARYL) 4 MG tablet Take 4 mg by mouth daily with breakfast.    10/13/2015 at Unknown time  . INVOKANA 100 MG TABS tablet Take 100 mg by mouth daily.  0 10/13/2015 at Unknown time  . LEVEMIR FLEXPEN 100 UNIT/ML SOPN Inject 15 Units as directed daily.    Past Week at Unknown time  . levothyroxine (SYNTHROID, LEVOTHROID) 50 MCG tablet Take 50 mcg by mouth daily before breakfast.   10/13/2015 at Unknown time  . metFORMIN (GLUCOPHAGE) 500 MG tablet Take 500 mg by mouth daily.   10/13/2015 at Unknown time  . metoCLOPramide (REGLAN) 10 MG tablet Take 10 mg by mouth 3 (three) times daily before meals.   10/13/2015 at Unknown time  . midodrine (PROAMATINE) 2.5 MG tablet Take 2.5 mg by mouth daily.   10/13/2015 at Unknown time  . omeprazole (PRILOSEC) 20 MG capsule Take 20 mg by mouth 2 (two) times daily before a meal.    10/13/2015 at Unknown time  . ondansetron (ZOFRAN) 4 MG tablet Take 4 mg by mouth every 8 (eight)  hours as needed for nausea or vomiting.   Past Week at Unknown time  . pravastatin (PRAVACHOL) 20 MG tablet Take 1 tablet by mouth daily.   10/13/2015 at Unknown time   Scheduled: . carbamazepine  300 mg Oral BID  . enoxaparin (LOVENOX) injection  40 mg Subcutaneous Q24H  . erythromycin  400 mg Oral TID AC & HS  . [START ON 10/21/2015] fludrocortisone  0.1 mg Oral Daily  . FLUoxetine  40 mg Oral Daily  . insulin aspart  0-9 Units Subcutaneous TID WC  . insulin detemir  7 Units Subcutaneous Daily  . levothyroxine  50 mcg Oral QAC breakfast  . midodrine  5 mg Oral TID WC  . pantoprazole  40 mg Oral BID  . polyethylene glycol  17 g Oral Daily  . pravastatin  20 mg Oral Daily  . senna-docusate  2 tablet Oral BID    ROS:                                                                                                                                       History obtained from the patient  General ROS: negative for - chills, fatigue, fever, night sweats, weight gain or weight loss Psychological ROS: negative for - behavioral disorder, hallucinations, memory difficulties, mood swings or suicidal ideation Ophthalmic ROS: negative for - blurry vision, double vision,  eye pain or loss of vision ENT ROS: negative for - epistaxis, nasal discharge, oral lesions, sore throat, tinnitus or vertigo Allergy and Immunology ROS: negative for - hives or itchy/watery eyes Hematological and Lymphatic ROS: negative for - bleeding problems, bruising or swollen lymph nodes Endocrine ROS: negative for - galactorrhea, hair pattern changes, polydipsia/polyuria or temperature intolerance Respiratory ROS: negative for - cough, hemoptysis, shortness of breath or wheezing Cardiovascular ROS: negative for - chest pain, dyspnea on exertion, edema or irregular heartbeat Gastrointestinal ROS: negative for - abdominal pain, diarrhea, hematemesis, nausea/vomiting or stool incontinence Genito-Urinary ROS: negative for - dysuria,  hematuria, incontinence or urinary frequency/urgency Musculoskeletal ROS: negative for - joint swelling or muscular weakness Neurological ROS: as noted in HPI Dermatological ROS: negative for rash and skin lesion changes  Neurologic Examination:                                                                                                      Blood pressure 163/57, pulse 92, temperature 98.5 F (36.9 C), temperature source Oral, resp. rate 18, height 5\' 3"  (1.6 m), weight 81.33 kg (179 lb 4.8 oz), SpO2 96 %.  HEENT-  Normocephalic, no lesions, without obvious abnormality.  Normal external eye and conjunctiva.  Normal TM's bilaterally.  Normal auditory canals and external ears. Normal external nose, mucus membranes and septum.  Normal pharynx. Cardiovascular- S1, S2 normal, pulses palpable throughout   Lungs- chest clear, no wheezing, rales, normal symmetric air entry Abdomen- normal findings: bowel sounds normal Extremities- no edema Lymph-no adenopathy palpable Musculoskeletal-no joint tenderness, deformity or swelling Skin-warm and dry, no hyperpigmentation, vitiligo, or suspicious lesions  Neurological Examination Mental Status: Alert.  Speech fluent without evidence of aphasia.  Able to follow 3 step commands without difficulty. Cranial Nerves: II:  Visual fields grossly normal, pupils equal, round, reactive to light and accommodation III,IV, VI: ptosis not present, extra-ocular motions intact bilaterally V,VII: smile symmetric, facial light touch sensation normal bilaterally VIII: hearing normal bilaterally IX,X: uvula rises symmetrically XI: bilateral shoulder shrug XII: midline tongue extension Motor: Right : Upper extremity   5/5    Left:     Upper extremity   5/5  Lower extremity   5/5     Lower extremity   5/5 Tone and bulk:normal tone throughout; no atrophy noted Sensory: Pinprick and light touch intact throughout, bilaterally Deep Tendon Reflexes: 2+ and symmetric  throughout Plantars: Right: downgoing   Left: downgoing Cerebellar: normal finger-to-nose and normal heel-to-shin test Gait: not tested       Lab Results: Basic Metabolic Panel:  Recent Labs Lab 10/14/15 0916 10/14/15 1530 10/15/15 0507 10/16/15 1141 10/17/15 0340 10/20/15 0809  NA 139  --  140 136 137 137  K 4.1  --  4.2 4.0 4.5 3.5  CL 101  --  106 103 103 102  CO2 27  --  24 21* 19* 19*  GLUCOSE 151*  --  91 146* 149* 125*  BUN 28*  --  19 26* 23* 21*  CREATININE 0.91 0.76 0.65 0.92 0.77 0.79  CALCIUM 9.6  --  8.6* 8.6* 8.7* 8.7*    Liver Function Tests:  Recent Labs Lab 10/14/15 0916 10/15/15 0507  AST 21 17  ALT 26 20  ALKPHOS 106 90  BILITOT 0.6 0.7  PROT 7.0 5.8*  ALBUMIN 3.7 3.1*    Recent Labs Lab 10/14/15 0916  LIPASE 26   No results for input(s): AMMONIA in the last 168 hours.  CBC:  Recent Labs Lab 10/14/15 0916 10/14/15 1530 10/20/15 0903  WBC 9.2 7.6 9.0  HGB 14.2 13.7 14.0  HCT 42.0 40.1 39.5  MCV 85.9 85.7 84.6  PLT 213 176 224    Cardiac Enzymes:  Recent Labs Lab 10/17/15 2236  TROPONINI <0.03    Lipid Panel: No results for input(s): CHOL, TRIG, HDL, CHOLHDL, VLDL, LDLCALC in the last 168 hours.  CBG:  Recent Labs Lab 10/19/15 1143 10/19/15 2151 10/20/15 0823 10/20/15 1246 10/20/15 1340  GLUCAP 120* 137* 113* 125* 146*    Microbiology: No results found for this or any previous visit.  Coagulation Studies: No results for input(s): LABPROT, INR in the last 72 hours.  Imaging: Dg Abd 1 View  10/19/2015  CLINICAL DATA:  Constipation. EXAM: ABDOMEN - 1 VIEW COMPARISON:  None. FINDINGS: Moderate stool burden throughout the colon. Nonobstructive bowel gas pattern. No free air organomegaly. Prior cholecystectomy. IMPRESSION: Moderate stool burden.  No acute findings. Electronically Signed   By: Rolm Baptise M.D.   On: 10/19/2015 18:13   Dg Chest Port 1 View  10/20/2015  CLINICAL DATA:  Increasing shortness of  breath with low-grade fever this morning. EXAM: PORTABLE CHEST 1 VIEW COMPARISON:  10/06/2015 FINDINGS: Lungs are well inflated without consolidation or effusion. Cardiomediastinal silhouette is within normal. There is minimal calcified plaque over the aortic arch. There are mild degenerative changes of the spine. Calcific density in the expected region of the supraspinatus tendon insertion bilaterally left worse than right which may be due to calcific tendinitis. IMPRESSION: No acute cardiopulmonary disease. Electronically Signed   By: Marin Olp M.D.   On: 10/20/2015 09:52       Assessment and plan discussed with with attending physician and they are in agreement.    Etta Quill PA-C [facility] [phone]  10/20/2015, 1:52 PM   On my assessment, the patient has regained her faculties. She is awake and alert. She has some increased tone in her right arm as well as resting tremor is present. On discussing with the family, she has a history of shuffling gait and decreased ability to walk over the past several months. They state that this was first noticed only a few months ago, but she has been getting dizzy for about a year. The dizziness has markedly worsened over the past few weeks. She has possible mild restriction in upgaze, but has intact downgaze and lateral movements.  On MRI, there is no midbrain atrophy which is commonly seen with PSP.   Assessment: 65 y.o. 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 The rate of progression is concerning for PSP or MSA, though slightly quick even for these. If the parkinsonian features as well as the rapid progression I think pure autonomic failure is less likely.  Treatments for Parkinson's disease would ideally be tried, however these can worsen orthostatic hypotension and given that this is by far and away her most disabling  problem at this time, I would not favor starting other medications which could worsen  it.  Stroke Risk Factors - diabetes mellitus  1) agree with restarting Midodrin, would give while awake and sitting 2) agree with continuing fludrocortisone 3) stockings, slow transitions from laying to sitting and sitting to standing 4) we will follow  Roland Rack, MD [facility] [phone]  If 7pm- 7am, please page neurology on call as listed in [place].

## 2015-10-20 NOTE — Progress Notes (Signed)
Received verbal order to hold off on that tap water enema to allow patient to bounce back from her episode.  Will attempt enema later in the shift and when patient feels ready

## 2015-10-21 DIAGNOSIS — R110 Nausea: Secondary | ICD-10-CM

## 2015-10-21 DIAGNOSIS — Z960 Presence of urogenital implants: Secondary | ICD-10-CM

## 2015-10-21 LAB — GLUCOSE, CAPILLARY
GLUCOSE-CAPILLARY: 94 mg/dL (ref 65–99)
Glucose-Capillary: 95 mg/dL (ref 65–99)
Glucose-Capillary: 110 mg/dL — ABNORMAL HIGH (ref 65–99)
Glucose-Capillary: 97 mg/dL (ref 65–99)

## 2015-10-21 LAB — ALDOSTERONE + RENIN ACTIVITY W/ RATIO
ALDO / PRA Ratio: <2.6 (ref 0.0–30.0)
Aldosterone: <1 ng/dL (ref 0.0–30.0)
PRA LC/MS/MS: 0.384 ng/mL/hr (ref 0.167–5.380)

## 2015-10-21 LAB — BASIC METABOLIC PANEL
Anion gap: 14 (ref 5–15)
BUN: 15 mg/dL (ref 6–20)
CALCIUM: 8.5 mg/dL — AB (ref 8.9–10.3)
CO2: 21 mmol/L — AB (ref 22–32)
CREATININE: 0.69 mg/dL (ref 0.44–1.00)
Chloride: 100 mmol/L — ABNORMAL LOW (ref 101–111)
GFR calc non Af Amer: >60 mL/min (ref >60)
Glucose, Bld: 89 mg/dL (ref 65–99)
Potassium: 3.1 mmol/L — ABNORMAL LOW (ref 3.5–5.1)
Sodium: 135 mmol/L (ref 135–145)

## 2015-10-21 MED ORDER — MIDODRINE HCL 5 MG PO TABS
5.0000 mg | ORAL_TABLET | Freq: Three times a day (TID) | ORAL | Status: DC
  Filled 2015-10-21: qty 1

## 2015-10-21 MED ORDER — ERYTHROMYCIN ETHYLSUCCINATE 400 MG/5ML PO SUSR
400.0000 mg | Freq: Three times a day (TID) | ORAL | Status: DC
  Administered 2015-10-21 – 2015-10-24 (×13): 400 mg via ORAL
  Filled 2015-10-21 (×15): qty 5

## 2015-10-21 MED ORDER — INSULIN DETEMIR 100 UNIT/ML ~~LOC~~ SOLN
5.0000 [IU] | Freq: Every day | SUBCUTANEOUS | Status: DC
  Administered 2015-10-22 – 2015-10-24 (×3): 5 [IU] via SUBCUTANEOUS
  Filled 2015-10-21 (×3): qty 0.05

## 2015-10-21 MED ORDER — MIDODRINE HCL 5 MG PO TABS
5.0000 mg | ORAL_TABLET | Freq: Three times a day (TID) | ORAL | Status: DC
Start: 2015-10-21 — End: 2015-10-22
  Administered 2015-10-21 – 2015-10-22 (×3): 5 mg via ORAL
  Filled 2015-10-21 (×2): qty 1

## 2015-10-21 NOTE — Progress Notes (Signed)
  Date: 10/21/2015  Patient name: Jenna Maldonado  Medical record number: [id number]  Date of birth: [birth year]   This patient has been seen and the plan of care was discussed with the house staff. Please see Dr. Shanna Cisco note for complete details. I concur with his findings.   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  She has parkinsonian features, autonomic dysfunction and severe orthostasis.  We attempted midrodrine with PT today and her standing BP was mildly improved (SBP in the 70s).  They are recommending SNF at this time.    Sid Falcon, MD 10/21/2015, 4:07 PM

## 2015-10-21 NOTE — Progress Notes (Signed)
Spoke with physician about protamine dose.  They want it given to patient before physical therapy works with patient.  I will contact PT to see when they will be seeing her

## 2015-10-21 NOTE — Progress Notes (Signed)
PT will call before seeing patient this morning.  Per MD request please give protamine dose only if patient will be standing or sitting up.

## 2015-10-21 NOTE — Progress Notes (Signed)
Patient does not want SNF at discharge. Was active with [facility] for [facility] PT RN Prior To Admission. CM will continue to follow.

## 2015-10-21 NOTE — Progress Notes (Signed)
Subjective: No further syncopal spells. Worked with PT this AM and again had significant orthostatics.   Laying [id number] systolic, Sitting 90 systolic and standing 70 systolic   Exam: Filed Vitals:   10/20/15 2155 10/21/15 0454  BP: 166/68 171/69  Pulse: 89 88  Temp: 98.4 F (36.9 C) 98 F (36.7 C)  Resp: 18 18      Gen: In bed, NAD MS: drowsy, able to follow commands and tell me she is in the hospital and name her daughter.  CN:2-12 intact Motor: MAEW Sensory:intact throughout   Pertinent Labs/Diagnostics: none  Etta Quill PA-C [facility] [phone]  Impression: 65 y.o. 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 The rate of progression is concerning for PSP or MSA, though slightly quick even for these. If the parkinsonian features as well as the rapid progression I think pure autonomic failure is less likely.  Treatments for Parkinson's disease would ideally be tried, however these can worsen orthostatic hypotension and given that this is by far and away her most disabling problem at this time, I would not favor starting other medications which could worsen it.   My suspicion at this time is for MSA.   Recommend:  1) Midodrine with sitting or standing 2) will send ganglionic ACH-R antibodies 3) fludrocortisone.  4) will follow.    Roland Rack, MD [facility] [phone]  If 7pm- 7am, please page neurology on call as listed in [place].   10/21/2015, 11:32 AM

## 2015-10-21 NOTE — Progress Notes (Signed)
Subjective: Patient reports headaches overnight after receiving the midodrine. BP was elevated to [id number] systolic and remained [id number] overnight. Reports some continued nausea this morning. No emesis.   Objective: Vital signs in last 24 hours: Filed Vitals:   10/20/15 1413 10/20/15 1714 10/20/15 2155 10/21/15 0454  BP: 133/59 190/78 166/68 171/69  Pulse: 81 90 89 88  Temp: 98.1 F (36.7 C) 98.3 F (36.8 C) 98.4 F (36.9 C) 98 F (36.7 C)  TempSrc: Oral Oral Oral   Resp: 20 20 18 18   Height:      Weight:      SpO2: 96% 96% 95% 97%   Weight change:   Intake/Output Summary (Last 24 hours) at 10/21/15 0825 Last data filed at 10/21/15 0459  Gross per 24 hour  Intake      0 ml  Output   1530 ml  Net  -1530 ml    General: alert, obese female, cooperative to examination.  Lungs: normal respiratory effort, no accessory muscle use, normal breath sounds, no crackles. Heart: normal rate, regular rhythm, no murmur, no gallop, and no rub.  Abdomen: soft, non-tender, normal bowel sounds, no distention, no guarding, no rebound tenderness Msk: no joint swelling, no joint warmth, and no redness over joints.  Pulses: 2+ DP/PT pulses bilaterally Extremities: pitting edema to mid calf, pressure stockings not in place Neurologic: alert & oriented X3, + cog wheeling in UEs, strength 5/5 throughout, flat affect, slowed RAM, tremor in right hand and lips, micro graphia.  Medications: I have reviewed the patient's current medications. Scheduled Meds: . carbamazepine  300 mg Oral BID  . enoxaparin (LOVENOX) injection  40 mg Subcutaneous Q24H  . erythromycin  400 mg Oral TID AC & HS  . fludrocortisone  0.1 mg Oral Daily  . FLUoxetine  40 mg Oral Daily  . insulin aspart  0-9 Units Subcutaneous TID WC  . insulin detemir  7 Units Subcutaneous Daily  . levothyroxine  50 mcg Oral QAC breakfast  . midodrine  10 mg Oral TID WC  . pantoprazole  40 mg Oral BID  . polyethylene glycol  17 g Oral Daily  .  pravastatin  20 mg Oral Daily  . senna-docusate  2 tablet Oral BID   Continuous Infusions: . sodium chloride 100 mL/hr at 10/20/15 2244   PRN Meds:.acetaminophen, ondansetron (ZOFRAN) IV, prochlorperazine Assessment/Plan:  Orthostatic Hypotension: Syncopal episode this yesterday with micturition. Restarted midodrine. BP elevated overnight. Spoke with nurses about only giving when patient is going to be upright. Appreciate Dr. Cecil Maldonado assistance. 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 PSP vs MSA vs PAF. No Parkinson's therapy at this time as it can worsen orthostatics. Overall poor prognosis given rapid progression of symptoms.  -AM cortisol was normal but aldo/renin still pending (lab reports results will be back today/tomorrow).  -Discussed lifestyle modifications; high salt diet, waist high stockings, standing slowly etc. -wean off fludrocortisone -decrease midodrine 10 to 5 mg tid. Discussed with nursing staff only when going to be upright.  -Getting records from hospitalization at [place]  Urinary Retention: Foley in place. 1.5 L out yesterday. Monitor output. Voiding trial once other symptoms improving.  -continue foley -u/a clear   Nausea/Vomiting: unclear etiology: No further emesis. Nausea unchanged. Consistent with her autonomic dysnfucntion. Had no improvement with Reglan. - will do a trial of erythromycin base 250mg  TID + cont PRN compazine.  -cont protonix bid in case GERD is playing a role.   Constipation: Enema yesterday with small  bowel movement. Continue bowel regimen.  -Miralax and Senokot-S  DM2: CBGs 92-146  -A1c 6.0 now. Unclear what it was in the past.  -Decrease Levemir 7 -> 5 units qhs -SSI-s  Hypothyroidism: TSH wnl -Continue home synthroid  Dispo: Disposition is deferred at this time, awaiting improvement of current medical problems.  Anticipated discharge in approximately 1-2 day(s).   The patient does have a current PCP Jenna Blitz, MD) and does need an [facility] hospital follow-up appointment after discharge.  The patient does not have transportation limitations that hinder transportation to clinic appointments.  .Services Needed at time of discharge: Y = Yes, Blank = No PT:   OT:   RN:   Equipment:   Other:     LOS: 2 days   Jenna Pile, MD I 10/21/2015, 8:25 AM

## 2015-10-21 NOTE — Progress Notes (Signed)
Physical Therapy Treatment Patient Details Name: Jenna Maldonado MRN: [id number] DOB: [birth year] Today's Date: 10/21/2015    History of Present Illness Patient is a 64 y/o female with hx of DM and depression presents with dizziness, near syncope and emesis. Recently admitted and d/ced from [facility] with syncope. Noted to have orthostatic hyoptension.    PT Comments    Patient continues to be limited by BP. BP in sitting 94/49, standing 73/46, and supine 131/61. Recommending SNF for further skilled PT services to maximize independence and safety for functional mobility.   Follow Up Recommendations  SNF     Equipment Recommendations  Other (comment) (TBD)    Recommendations for Other Services       Precautions / Restrictions Precautions Precautions: Fall Precaution Comments: orthostatic hypotension Restrictions Weight Bearing Restrictions: No    Mobility  Bed Mobility Overal bed mobility: Needs Assistance Bed Mobility: Rolling;Sidelying to Sit;Sit to Supine Rolling: Supervision Sidelying to sit: Mod assist;HOB elevated   Sit to supine: +2 for physical assistance;Max assist   General bed mobility comments: BP in sitting 94/49 and 131/61 when supine; assist to elevate trunk into sitting and max A +2 to return to supine with cues for sequencing and use of bedrail  Transfers Overall transfer level: Needs assistance Equipment used: Rolling walker (2 wheeled) Transfers: Sit to/from Stand Sit to Stand: Min assist;+2 safety/equipment         General transfer comment: assist to power up into standing with cues for hand placement; pt quickly demonstrated glazed over look and was not responding to cues or questions; BP 73/46 in standing; pt returned to supine  Ambulation/Gait                 Stairs            Wheelchair Mobility    Modified Rankin (Stroke Patients Only)       Balance   Sitting-balance support: Feet supported Sitting balance-Leahy Scale:  Fair       Standing balance-Leahy Scale: Poor                      Cognition Arousal/Alertness: Awake/alert Behavior During Therapy: Flat affect Overall Cognitive Status: Within Functional Limits for tasks assessed                      Exercises      General Comments General comments (skin integrity, edema, etc.): daughter present for session       Pertinent Vitals/Pain Pain Assessment: No/denies pain    Home Living                      Prior Function            PT Goals (current goals can now be found in the care plan section) Progress towards PT goals: Not progressing toward goals - comment (limited by BP)    Frequency  Min 3X/week    PT Plan Discharge plan needs to be updated    Co-evaluation             End of Session Equipment Utilized During Treatment: Gait belt Activity Tolerance: Treatment limited secondary to medical complications (Comment) (BP) Patient left: in bed;with call bell/phone within reach;with SCD's reapplied;with bed alarm set;with family/visitor present;with nursing/sitter in room (physician present end of session)     Time: 1025-1103 PT Time Calculation (min) (ACUTE ONLY): 38 min  Charges:  $Therapeutic Activity: 38-52 mins  G Codes:      [place]ina April, PTA Pager: [phone]   10/21/2015, 3:53 PM

## 2015-10-22 LAB — GLUCOSE, CAPILLARY
GLUCOSE-CAPILLARY: 90 mg/dL (ref 65–99)
GLUCOSE-CAPILLARY: 106 mg/dL — AB (ref 65–99)
Glucose-Capillary: 101 mg/dL — ABNORMAL HIGH (ref 65–99)
Glucose-Capillary: 130 mg/dL — ABNORMAL HIGH (ref 65–99)

## 2015-10-22 MED ORDER — BETHANECHOL CHLORIDE 5 MG PO TABS
5.0000 mg | ORAL_TABLET | Freq: Three times a day (TID) | ORAL | Status: DC
  Administered 2015-10-22 (×3): 5 mg via ORAL
  Filled 2015-10-22 (×4): qty 1

## 2015-10-22 MED ORDER — FLUDROCORTISONE ACETATE 0.1 MG PO TABS
0.2000 mg | ORAL_TABLET | Freq: Every day | ORAL | Status: DC
Start: 2015-10-23 — End: 2015-10-24
  Administered 2015-10-23 – 2015-10-24 (×2): 0.2 mg via ORAL
  Filled 2015-10-22 (×2): qty 2

## 2015-10-22 MED ORDER — BOOST / RESOURCE BREEZE PO LIQD
1.0000 | Freq: Three times a day (TID) | ORAL | Status: DC
  Administered 2015-10-22 – 2015-10-23 (×5): 1 via ORAL

## 2015-10-22 MED ORDER — MIDODRINE HCL 5 MG PO TABS
7.5000 mg | ORAL_TABLET | Freq: Three times a day (TID) | ORAL | Status: DC | PRN
  Administered 2015-10-23: 7.5 mg via ORAL
  Filled 2015-10-22: qty 2

## 2015-10-22 NOTE — Progress Notes (Signed)
Subjective:   Exam: Filed Vitals:   10/21/15 2249 10/22/15 0558  BP: 156/69 160/68  Pulse: 92 88  Temp: 99.8 F (37.7 C) 99 F (37.2 C)  Resp: 18 18      Gen: In bed, NAD MS: awake, alert, interactive CN: fece symmetric, EOMI Motor: She is able to lift all extremities out of bed.  Sensory:intact throughout   Impression: 65 y.o. 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 The rate of progression is concerning for PSP or MSA, though slightly quick even for these. If the parkinsonian features as well as the rapid progression I think pure autonomic failure is less likely.  Treatments for Parkinson's disease would ideally be tried, however these can worsen orthostatic hypotension and given that this is by far and away her most disabling problem at this time, I would not favor starting other medications which could worsen it.   My suspicion at this time is for MSA.   Recommend:  1) Midodrine with sitting or standing, agree with increasing dose.  2) ganglionic ACH-R antibodies pending, will also check SSA, SSB 3) fludrocortisone.  4) will follow.    Roland Rack, MD [facility] [phone]  If 7pm- 7am, please page neurology on call as listed in [place].   10/22/2015, 12:25 PM

## 2015-10-22 NOTE — Progress Notes (Signed)
Subjective: No events overnight. Unable to work much with PT yesterday. Nausea uncharged. No further episodes of emesis.   Objective: Vital signs in last 24 hours: Filed Vitals:   10/21/15 1515 10/21/15 1841 10/21/15 2249 10/22/15 0558  BP:   156/69 160/68  Pulse:   92 88  Temp: 98.4 F (36.9 C)  99.8 F (37.7 C) 99 F (37.2 C)  TempSrc: Oral  Oral Oral  Resp: 18  18 18   Height:      Weight:  166 lb 1.6 oz (75.342 kg)    SpO2: 98%  94% 97%   Weight change:   Intake/Output Summary (Last 24 hours) at 10/22/15 1108 Last data filed at 10/22/15 0700  Gross per 24 hour  Intake 886.67 ml  Output   2100 ml  Net -1213.33 ml    General: alert, obese female, cooperative to examination.  Lungs: normal respiratory effort, no accessory muscle use, normal breath sounds, no crackles. Heart: normal rate, regular rhythm, no murmur, no gallop, and no rub.  Abdomen: soft, non-tender, normal bowel sounds, no distention Extremities: pitting edema to mid calf, pressure stockings not in place Neurologic: alert & oriented X3, + cog wheeling in UEs, strength 5/5 throughout, flat affect, slowed RAM, tremor in right hand and lips, micro graphia.  GU: foley in place, clear urine  Medications: I have reviewed the patient's current medications. Scheduled Meds: . bethanechol  5 mg Oral TID  . carbamazepine  300 mg Oral BID  . enoxaparin (LOVENOX) injection  40 mg Subcutaneous Q24H  . erythromycin  400 mg Oral TID AC & HS  . feeding supplement  1 Container Oral TID BM  . fludrocortisone  0.1 mg Oral Daily  . FLUoxetine  40 mg Oral Daily  . insulin aspart  0-9 Units Subcutaneous TID WC  . insulin detemir  5 Units Subcutaneous Daily  . levothyroxine  50 mcg Oral QAC breakfast  . pantoprazole  40 mg Oral BID  . polyethylene glycol  17 g Oral Daily  . pravastatin  20 mg Oral Daily  . senna-docusate  2 tablet Oral BID   Continuous Infusions: . sodium chloride 100 mL/hr at 10/22/15 0304   PRN  Meds:.acetaminophen, midodrine, ondansetron (ZOFRAN) IV, prochlorperazine Assessment/Plan:  Autonomic Failure with Parkinsonian features: Multisystem atrophy is highest on the differential at this time. Neurology following. Sending ganglionic ACH-R antibody tests to rule out neurologic autoimmune disease. Orthostatics not improved with midodrine 5 mg yesterday. Will titrate up to 7.5 mg today. Aldo/Renin test resulted but unfortunately were recollected (inadequate sample initially) after initiating fludrocortisone and were collected in the afternoon. Cannot interpret the results due to this. No clinical response to the fludrocortisone. Low clinical suspicion for adrenal disease at this time. Discussed poor prognosis with family. Will continue to work with PT and adjust midodrine dose to try to get as much function as possible though unlikely she will regain much function at this point. Discussed possible SNF placement today. Family concerned that PT will not work with her at [facility] as we have not had much success inpatient. Will have social worker discuss options with family today.  -Spoke with nurse about keeping stockings in place. Has not had them on any time I have seen the patient.  -wean off fludrocortisone -increase midodrine 5 to 7.5 tid. Discussed with nursing staff only when going to be upright.   Urinary Retention: Will d/c foley today. Start bethanechol. Monitor urine output.  -d/c foley -bethanechol 5 mg tid, titrate  dose tomorrow if no response. Foley if needed.  -strict I&O  Nausea/Vomiting: unclear etiology: No further emesis. Nausea unchanged. Consistent with her autonomic dysnfucntion. -erythromycin base 250mg  TID + cont PRN compazine.  -cont protonix bid in case GERD is playing a role.  -Start boost tid   Constipation: Continue bowel regimen.  -Miralax and Senokot-S  DM2: CBGs 90-110 -A1c 6.0 now. Unclear what it was in the past.  -Continue Levemir 5 units  qhs -SSI-s  Hypothyroidism: TSH wnl -Continue home synthroid  Dispo: Disposition is deferred at this time, awaiting improvement of current medical problems.  Anticipated discharge in approximately 1-2 day(s).   The patient does have a current PCP Monico Blitz, MD) and does need an [facility] hospital follow-up appointment after discharge.  The patient does not have transportation limitations that hinder transportation to clinic appointments.  .Services Needed at time of discharge: Y = Yes, Blank = No PT:   OT:   RN:   Equipment:   Other:     LOS: 3 days   [name], MD I 10/22/2015, 11:08 AM

## 2015-10-22 NOTE — Care Management Important Message (Signed)
Important Message  Patient Details  Name: Jenna Maldonado MRN: [id number] Date of Birth: 02/07/1951   Medicare Important Message Given:  Yes    [name] May 10/22/2015, 1:14 PM

## 2015-10-22 NOTE — Progress Notes (Signed)
  Date: 10/22/2015  Patient name: Jenna Maldonado  Medical record number: [id number]  Date of birth: 07/20/1950   This patient has been seen and the plan of care was discussed with the house staff. Please see Dr. Shanna Cisco note for complete details. I concur with his  Findings.   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 First line for orthostasis related to this disease is florinef, so we will titrate this to full dose and possibly still use midodrine PRN for attempts to stand.  I am concerned that she may not be able to stand given her severe orthostasis and may be chair/wheelchair bound.  Have asked resident team to have PT attempt sitting in chair and transfers to chair at next session.   Sid Falcon, MD 10/22/2015, 9:14 PM

## 2015-10-22 NOTE — NC FL2 (Signed)
[place] LEVEL OF CARE SCREENING TOOL     IDENTIFICATION  Patient Name: Jenna Maldonado Birthdate: 04-02-1951 Sex: female Admission Date (Current Location): 10/14/2015  [facility] and [place] Number:  [employer] and Address:  [facility] [place], [place] [address], [place], [place] [address]      Provider Number: [id number]  Attending Physician Name and Address:  Sid Falcon, MD  Relative Name and Phone Number:       Current Level of Care: Hospital Recommended Level of Care: [place] Prior Approval Number:    Date Approved/Denied:   PASRR Number: [id number]  Discharge Plan: SNF    Current Diagnoses: Patient Active Problem List   Diagnosis Date Noted  . Constipation   . Morning headache   . DM type 2, uncontrolled, with neuropathy ([place]) 10/14/2015  . Orthostatic hypotension 10/14/2015  . Adrenal mass, left ([place]) 10/14/2015  . RBBB 10/14/2015  . Emesis   . Right facial pain 01/02/2013  . Diabetic neuropathy ([place]) 01/02/2013    Orientation RESPIRATION BLADDER Height & Weight     Self, Time, Situation, Place  Normal Continent Weight: 166 lb 1.6 oz (75.342 kg) Height:  5\' 3"  (160 cm)  BEHAVIORAL SYMPTOMS/MOOD NEUROLOGICAL BOWEL NUTRITION STATUS      Continent Diet (see DC)  AMBULATORY STATUS COMMUNICATION OF NEEDS Skin   Extensive Assist Verbally Normal                       Personal Care Assistance Level of Assistance  Bathing, Dressing Bathing Assistance: Maximum assistance   Dressing Assistance: Maximum assistance     Functional Limitations Info             SPECIAL CARE FACTORS FREQUENCY  PT (By licensed PT), OT (By licensed OT)     PT Frequency: 5/wk OT Frequency: 5/wk            Contractures      Additional Factors Info  Code Status, Allergies Code Status Info: FULL Allergies Info: Atorvastatin, Ciprofloxacin, Codeine, Flagyl, Hydrocodone, Pioglitazone, Sulfa Antibiotics           Current Medications (10/22/2015):  This is the current hospital active medication list Current Facility-Administered Medications  Medication Dose Route Frequency Provider Last Rate Last Dose  . 0.9 %  sodium chloride infusion   Intravenous Continuous [name], MD 100 mL/hr at 10/22/15 0304    . acetaminophen (TYLENOL) tablet 500 mg  500 mg Oral Q4H PRN [name], MD   500 mg at 10/22/15 0604  . bethanechol (URECHOLINE) tablet 5 mg  5 mg Oral TID [name], MD   5 mg at 10/22/15 1236  . carbamazepine (TEGRETOL XR) 12 hr tablet 300 mg  300 mg Oral BID Sid Falcon, MD   300 mg at 10/22/15 0931  . enoxaparin (LOVENOX) injection 40 mg  40 mg Subcutaneous Q24H Tasrif Ahmed, MD   40 mg at 10/21/15 2114  . erythromycin (EES) 400 MG/5ML suspension 400 mg  400 mg Oral TID AC & HS [name], MD   400 mg at 10/22/15 1236  . feeding supplement (BOOST / RESOURCE BREEZE) liquid 1 Container  1 Container Oral TID BM [name], MD   1 Container at 10/22/15 1441  . [START ON 10/23/2015] fludrocortisone (FLORINEF) tablet 0.2 mg  0.2 mg Oral Daily [name], MD      . FLUoxetine (PROZAC) capsule 40 mg  40 mg Oral  Daily [name], MD   40 mg at 10/22/15 0934  . insulin aspart (novoLOG) injection 0-9 Units  0-9 Units Subcutaneous TID WC [name], MD   1 Units at 10/20/15 1822  . insulin detemir (LEVEMIR) injection 5 Units  5 Units Subcutaneous Daily [name], MD   5 Units at 10/22/15 0934  . levothyroxine (SYNTHROID, LEVOTHROID) tablet 50 mcg  50 mcg Oral QAC breakfast [name], MD   50 mcg at 10/22/15 0934  . midodrine (PROAMATINE) tablet 7.5 mg  7.5 mg Oral TID PRN [name], MD      . ondansetron [facility]) injection 4 mg  4 mg Intravenous Q8H PRN Jule Ser, DO   4 mg at 10/22/15 0303  . pantoprazole (PROTONIX) EC tablet 40 mg  40 mg Oral BID Sid Falcon, MD   40 mg at 10/22/15 [id number]  . polyethylene glycol (MIRALAX / GLYCOLAX) packet 17 g  17 g Oral  Daily [name], MD   17 g at 10/22/15 0930  . pravastatin (PRAVACHOL) tablet 20 mg  20 mg Oral Daily Tasrif Ahmed, MD   20 mg at 10/22/15 0937  . prochlorperazine (COMPAZINE) injection 10 mg  10 mg Intravenous Q6H PRN [name], MD   10 mg at 10/21/15 2114  . senna-docusate (Senokot-S) tablet 2 tablet  2 tablet Oral BID [name], MD   2 tablet at 10/22/15 [id number]     Discharge Medications: Please see discharge summary for a list of discharge medications.  Relevant Imaging Results:  Relevant Lab Results:   Additional Information SS#: [id number]  Cranford Mon, [place]

## 2015-10-23 DIAGNOSIS — E876 Hypokalemia: Secondary | ICD-10-CM

## 2015-10-23 LAB — SJOGRENS SYNDROME-B EXTRACTABLE NUCLEAR ANTIBODY

## 2015-10-23 LAB — BASIC METABOLIC PANEL
ANION GAP: 13 (ref 5–15)
BUN: 11 mg/dL (ref 6–20)
CALCIUM: 8.4 mg/dL — AB (ref 8.9–10.3)
CO2: 24 mmol/L (ref 22–32)
Chloride: 101 mmol/L (ref 101–111)
Creatinine, Ser: 0.75 mg/dL (ref 0.44–1.00)
GFR calc Af Amer: >60 mL/min (ref >60)
GLUCOSE: 126 mg/dL — AB (ref 65–99)
Potassium: 2.5 mmol/L — CL (ref 3.5–5.1)
SODIUM: 138 mmol/L (ref 135–145)

## 2015-10-23 LAB — MAGNESIUM: MAGNESIUM: 1.4 mg/dL — AB (ref 1.7–2.4)

## 2015-10-23 LAB — GLUCOSE, CAPILLARY
GLUCOSE-CAPILLARY: 93 mg/dL (ref 65–99)
GLUCOSE-CAPILLARY: 136 mg/dL — AB (ref 65–99)
GLUCOSE-CAPILLARY: 153 mg/dL — AB (ref 65–99)
Glucose-Capillary: 149 mg/dL — ABNORMAL HIGH (ref 65–99)

## 2015-10-23 LAB — SJOGRENS SYNDROME-A EXTRACTABLE NUCLEAR ANTIBODY: SSA (Ro) (ENA) Antibody, IgG: <0.2 AI (ref 0.0–0.9)

## 2015-10-23 MED ORDER — MAGNESIUM SULFATE 2 GM/50ML IV SOLN
2.0000 g | Freq: Once | INTRAVENOUS | Status: AC
  Administered 2015-10-23: 2 g via INTRAVENOUS
  Filled 2015-10-23: qty 50

## 2015-10-23 MED ORDER — BETHANECHOL CHLORIDE 10 MG PO TABS
10.0000 mg | ORAL_TABLET | Freq: Three times a day (TID) | ORAL | Status: DC
  Administered 2015-10-23 – 2015-10-24 (×4): 10 mg via ORAL
  Filled 2015-10-23 (×7): qty 1

## 2015-10-23 MED ORDER — POTASSIUM CHLORIDE CRYS ER 20 MEQ PO TBCR
40.0000 meq | EXTENDED_RELEASE_TABLET | Freq: Once | ORAL | Status: AC
  Administered 2015-10-23: 40 meq via ORAL
  Filled 2015-10-23: qty 2

## 2015-10-23 MED ORDER — POTASSIUM CHLORIDE CRYS ER 20 MEQ PO TBCR
40.0000 meq | EXTENDED_RELEASE_TABLET | ORAL | Status: AC
  Administered 2015-10-23 – 2015-10-24 (×2): 40 meq via ORAL
  Filled 2015-10-23 (×2): qty 2

## 2015-10-23 NOTE — Progress Notes (Signed)
Subjective: No change  Exam: Filed Vitals:   10/22/15 2256 10/23/15 0526  BP: 179/81 169/73  Pulse: 89 83  Temp: 98.3 F (36.8 C) 98.2 F (36.8 C)  Resp: 19 18   Gen: In bed, NAD Resp: non-labored breathing, no acute distress Abd: soft, nt  Neuro: MS: awake, alert,  [id number], EOMI Motor: mild resting tremor on the right.   Pertinent Labs: Ganglionic nicotinic acetylcholine receptor antibody - appears to have not been sent SSA, SSB - negative  Impression: 65 y.o. 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 The rate of progression is concerning for PSP or MSA, though slightly quick even for these. If the parkinsonian features as well as the rapid progression I think pure autonomic failure is less likely.  Treatments for Parkinson's disease would ideally be tried, however these can worsen orthostatic hypotension and given that this is by far and away her most disabling problem at this time, I would not favor starting other medications which could worsen it.   My suspicion at this time is for MSA.   Recommendations: 1) I had a long discussion with the daughter and patient today regarding that I though this to be a progressive disease. They are asking about home vs SNF 2) I have asked nurse to ensure miscellaneous test ordered on 05/10 is drawn today. This needs to be performed prior to discharge. (GnACHR antibodies) 3) Use midodrine, up to 10mg  TID, as well as fludrocortisone for orthostasis.  4) I think that she may benefit from an outpatient referral to an autonomic center or academic neurology center.   Roland Rack, MD [facility] [phone]  If 7pm- 7am, please page neurology on call as listed in [place].

## 2015-10-23 NOTE — Progress Notes (Signed)
Physical Therapy Treatment Patient Details Name: Jenna Maldonado MRN: [id number] DOB: 1950/06/25 Today's Date: 10/23/2015    History of Present Illness Patient is a 65 y/o female with hx of DM and depression presents with dizziness, near syncope and emesis. Recently admitted and d/ced from [facility] with syncope. Noted to have orthostatic hyoptension.    PT Comments    Patient with improved tolerance of OOB mobility this session with BP 164/74 in supine and 116/59 once sitting up in recliner. Husband present for session. Current plan remains appropriate.   Follow Up Recommendations  SNF     Equipment Recommendations  Other (comment) (TBD)    Recommendations for Other Services       Precautions / Restrictions Precautions Precautions: Fall Precaution Comments: orthostatic hypotension Restrictions Weight Bearing Restrictions: No    Mobility  Bed Mobility Overal bed mobility: Needs Assistance Bed Mobility: Supine to Sit     Supine to sit: Mod assist     General bed mobility comments: assist to elevate trunk into sitting and cues for technique  Transfers Overall transfer level: Needs assistance Equipment used: Rolling walker (2 wheeled) Transfers: Stand Pivot Transfers   Stand pivot transfers: Min assist;+2 safety/equipment       General transfer comment: assist to power up into standing and management of RW when pivoting;pt shuffles feet and unable to pick them up from floor to turn; mod/max cues for foot placement and safe use of DME  Ambulation/Gait                 Stairs            Wheelchair Mobility    Modified Rankin (Stroke Patients Only)       Balance Overall balance assessment: Needs assistance Sitting-balance support: Feet supported;No upper extremity supported Sitting balance-Leahy Scale: Fair     Standing balance support: Bilateral upper extremity supported;During functional activity Standing balance-Leahy Scale: Poor                      Cognition Arousal/Alertness: Awake/alert Behavior During Therapy: Flat affect Overall Cognitive Status: Within Functional Limits for tasks assessed                      Exercises      General Comments General comments (skin integrity, edema, etc.): BP in supine 164/74 and in sitting 116/59; husband present for session      Pertinent Vitals/Pain Pain Assessment: No/denies pain    Home Living                      Prior Function            PT Goals (current goals can now be found in the care plan section) Acute Rehab PT Goals Patient Stated Goal: none stated Progress towards PT goals: Progressing toward goals    Frequency  Min 3X/week    PT Plan Discharge plan needs to be updated    Co-evaluation             End of Session Equipment Utilized During Treatment: Gait belt Activity Tolerance: Patient tolerated treatment well Patient left: with call bell/phone within reach;with family/visitor present;in chair (physician present end of session)     Time: [id number] PT Time Calculation (min) (ACUTE ONLY): 22 min  Charges:  $Therapeutic Activity: 8-22 mins                    G Codes:  [place] April, PTA Pager: [phone]   10/23/2015, 2:31 PM

## 2015-10-23 NOTE — Progress Notes (Signed)
CRITICAL VALUE ALERT  Critical value received:  K 2.5  Date of notification:  10/23/15  Time of notification:  7:45 PM   Critical value read back:Yes.    Nurse who received alert:  Kandyce Rud  MD notified (1st page):    Time of first page:  7:46 PM   Responding MD:    Time MD responded:  7:50 PM

## 2015-10-23 NOTE — Evaluation (Signed)
Occupational Therapy Evaluation Patient Details Name: Jenna Maldonado MRN: [id number] DOB: [birth year] Today's Date: 10/23/2015    History of Present Illness Patient is a 65 y/o female with hx of DM and depression presents with dizziness, near syncope and emesis. Recently admitted and d/ced from [facility] with syncope. Noted to have orthostatic hyoptension.   Clinical Impression   Patient presenting with decreased ADL and functional mobility independence secondary to above. Patient independent and living alone PTA. Patient currently functioning at an overall min to max assist level, requiring +2 for functional transfers. Patient will benefit from acute OT to increase overall independence in the areas of ADLs, functional mobility, and overall safety in order to safely discharge to venue listed below.     Follow Up Recommendations  SNF;Supervision/Assistance - 24 hour    Equipment Recommendations  Other (comment) (TBD next venue of care)    Recommendations for Other Services  None at this time  Precautions / Restrictions Precautions Precautions: Fall Precaution Comments: orthostatic hypotension Restrictions Weight Bearing Restrictions: No      Mobility Bed Mobility - Per PT note Overal bed mobility: Needs Assistance Bed Mobility: Supine to Sit     Supine to sit: Mod assist     General bed mobility comments: assist to elevate trunk into sitting and cues for technique  Transfers - Per PT note Overall transfer level: Needs assistance Equipment used: Rolling walker (2 wheeled) Transfers: Stand Pivot Transfers   Stand pivot transfers: Min assist;+2 safety/equipment       General transfer comment: assist to power up into standing and management of RW when pivoting;pt shuffles feet and unable to pick them up from floor to turn; mod/max cues for foot placement and safe use of DME    Balance Overall balance assessment: Needs assistance Sitting-balance support: No upper extremity  supported;Feet supported Sitting balance-Leahy Scale: Poor Sitting balance - Comments: Pt with right lateral lean sitting in recliner, no back support during ADL tasks    Per PT note: Standing balance support: Bilateral upper extremity supported;During functional activity Standing balance-Leahy Scale: Poor    ADL Overall ADL's : Needs assistance/impaired Eating/Feeding: Set up;Sitting   Grooming: Set up;Sitting   Upper Body Bathing: Minimal assitance;Sitting   Lower Body Bathing: Moderate assistance   Upper Body Dressing : Sitting;Minimal assistance   Lower Body Dressing: Maximal assistance General ADL Comments: Pt with decreased activity tolerance/endurance and decreased overall strength. Pt barely able to cross legs for LB ADLs, takes increased time to complete this on her own, and requires assistance with donning of socks due to weakenss and stiffness. Educated pt on importance of getting out of bed daily to help increase functional strength and endurance.     Pertinent Vitals/Pain Pain Assessment: No/denies pain     Hand Dominance Right   Extremity/Trunk Assessment Upper Extremity Assessment Upper Extremity Assessment: Generalized weakness   Lower Extremity Assessment Lower Extremity Assessment: Defer to PT evaluation       Communication Communication Communication: No difficulties   Cognition Arousal/Alertness: Awake/alert Behavior During Therapy: Flat affect Overall Cognitive Status: Within Functional Limits for tasks assessed              Home Living Family/patient expects to be discharged to:: Private residence Living Arrangements: Alone;Children (moved in with daughter recently) Available Help at Discharge: Family Type of Home: House Home Access: Stairs to enter [employer] of Steps: 1 for her house; 3 steps to daughters house Entrance Stairs-Rails: Right Home Layout: One level  Bathroom Shower/Tub: Mining engineer: Standard     Home Equipment: Environmental consultant - 2 wheels;Cane - single point;Shower seat   Prior Functioning/Environment Level of Independence: Independent with assistive device(s)  Comments: Prior to 1 month ago, pt independent. Recently has needed RW for mobility. Moved in with daughter. Daughter does IADLs. Pt reports being "scared" to take showers as she is fearful of falling     OT Diagnosis: Generalized weakness   OT Problem List: Decreased strength;Decreased activity tolerance;Impaired balance (sitting and/or standing);Decreased safety awareness;Decreased knowledge of use of DME or AE;Decreased knowledge of precautions   OT Treatment/Interventions: Self-care/ADL training;Therapeutic exercise;Energy conservation;DME and/or AE instruction;Therapeutic activities;Patient/family education;Balance training    OT Goals(Current goals can be found in the care plan section) Acute Rehab OT Goals Patient Stated Goal: none stated OT Goal Formulation: With patient/family Time For Goal Achievement: 11/06/15 Potential to Achieve Goals: Good ADL Goals Pt Will Perform Grooming: with modified independence;sitting Pt Will Perform Lower Body Bathing: with min assist;sit to/from stand Pt Will Perform Lower Body Dressing: with min assist;sit to/from stand Pt Will Transfer to Toilet: with min assist;stand pivot transfer;bedside commode Additional ADL Goal #1: Pt will engage in bed mobility with supervision as a precursor for ADLs and to decrease burden of care Additional ADL Goal #2: Pt will perform sit to/from stands with min assist as a precursor for ADLs and to decrease burden of care   OT Frequency: Min 2X/week   Barriers to D/C: Decreased caregiver support    End of Session Activity Tolerance: Patient tolerated treatment well Patient left: in chair;with call bell/phone within reach;with family/visitor present   Time: [id number] OT Time Calculation (min): 18 min Charges:  OT General  Charges $OT Visit: 1 Procedure OT Evaluation $OT Eval Moderate Complexity: 1 Procedure  Chrys Racer , MS, OTR/L, CLT Pager: [phone]  10/23/2015, 3:21 PM

## 2015-10-23 NOTE — Progress Notes (Signed)
Call to Dr Benjamine Mola to inform him pt unable to void on her own after 8 hours since foley removed so new foley cath inserted at this time per MD orders with 1000 cc clear yellow urine immediate return.

## 2015-10-23 NOTE — Clinical Social Work Note (Signed)
Clinical Social Work Assessment  Patient Details  Name: Jenna Maldonado MRN: [id number] Date of Birth: [birth year]  Date of referral:  10/23/15               Reason for consult:  Facility Placement                Permission sought to share information with:  Facility Sport and exercise psychologist, Family Supports Permission granted to share information::  Yes, Verbal Permission Granted  Name::     [place]::  [facility] SNFs  Relationship::  Daughter  Contact Information:  [phone]  Housing/Transportation Living arrangements for the past 2 months:  [place] of Information:  Adult Children Patient Interpreter Needed:  None Criminal Activity/Legal Involvement Pertinent to Current Situation/Hospitalization:  No - Comment as needed Significant Relationships:  Adult Children, Other Family Members Lives with:  Relatives Do you feel safe going back to the place where you live?  No Need for family participation in patient care:  Yes (Comment)  Care giving concerns:  CSW received referral for possible SNF placement at time of discharge. CSW met with patient and patient's daughter regarding PT recommendation of SNF placement at time of discharge. Per patient's daughter, family is unsure whether patient would participate in PT at a SNF. If not, she would prefer patient to live with patient's niece who does not work and can provided care full time. Patient's daughter expressed understanding of PT recommendation and may be agreeable to SNF placement at time of discharge. CSW to continue to follow and assist with discharge planning needs.   Social Worker assessment / plan:  CSW spoke with patient and patient's daughter concerning possibility of rehab at [facility] before returning home versus going home.  Employment status:  Retired Nurse, adult PT Recommendations:  [place] / Referral to community resources:  [place]  Patient/Family's Response to care:  Patient's daughter recognizes need for rehab before returning home and may be agreeable to a SNF in [place].   Patient/Family's Understanding of and Emotional Response to Diagnosis, Current Treatment, and Prognosis:  Patient is realistic regarding therapy needs. No questions/concerns about plan or treatment.    Emotional Assessment Appearance:  Appears stated age Attitude/Demeanor/Rapport:   (Appropriate) Affect (typically observed):  Appropriate Orientation:  Oriented to Self, Oriented to Place, Oriented to  Time, Oriented to Situation Alcohol / Substance use:  Not Applicable Psych involvement (Current and /or in the community):  No (Comment)  Discharge Needs  Concerns to be addressed:  Care Coordination Readmission within the last 30 days:  No Current discharge risk:  None Barriers to Discharge:  Continued Medical Work up   [employer], [place] 10/23/2015, 9:03 AM

## 2015-10-23 NOTE — Progress Notes (Signed)
Patient able to discharge to [place] over weekend.  Jenna Maldonado LCSWA [phone]

## 2015-10-23 NOTE — Progress Notes (Signed)
Subjective: No events overnight. Jenna Maldonado reporting she is having to help Jenna Maldonado eat her meals. Jenna Maldonado reports she does not have an appetite. Unclear if Jenna Maldonado helping due to Jenna Maldonado having progressive weakness or simply lack of desire to eat. Nausea unchanged. Jenna Maldonado reports some headaches at rest after starting the midodrine.   Objective: Vital signs in last 24 hours: Filed Vitals:   10/22/15 1427 10/22/15 2256 10/23/15 0526 10/23/15 1349  BP: 176/74 179/81 169/73 156/74  Pulse: 90 89 83 92  Temp: 98.2 F (36.8 C) 98.3 F (36.8 C) 98.2 F (36.8 C) 98 F (36.7 C)  TempSrc: Oral Oral Oral Oral  Resp: 18 19 18 20   Height:      Weight:      SpO2: 98% 98% 97% 95%   Weight change:   Intake/Output Summary (Last 24 hours) at 10/23/15 1620 Last data filed at 10/23/15 1300  Gross per 24 hour  Intake    240 ml  Output   3600 ml  Net  -3360 ml    General: alert, obese female, cooperative to examination.  Lungs: normal respiratory effort, no accessory muscle use, normal breath sounds, no crackles. Heart: normal rate, regular rhythm, no murmur, no gallop, and no rub.  Abdomen: soft, non-tender, normal bowel sounds, no distention Extremities: pitting edema to mid calf, pressure stockings in place Neurologic: alert & oriented X3, + cog wheeling in UEs, strength 5/5 throughout, flat affect, slowed RAM, tremor in right hand and lips, micro graphia.  GU: foley in place, clear urine  Medications: I have reviewed the Jenna Maldonado's current medications. Scheduled Meds: . bethanechol  10 mg Oral TID  . carbamazepine  300 mg Oral BID  . enoxaparin (LOVENOX) injection  40 mg Subcutaneous Q24H  . erythromycin  400 mg Oral TID AC & HS  . feeding supplement  1 Container Oral TID BM  . fludrocortisone  0.2 mg Oral Daily  . FLUoxetine  40 mg Oral Daily  . insulin aspart  0-9 Units Subcutaneous TID WC  . insulin detemir  5 Units Subcutaneous Daily  . levothyroxine  50 mcg Oral QAC breakfast    . pantoprazole  40 mg Oral BID  . polyethylene glycol  17 g Oral Daily  . potassium chloride  40 mEq Oral Once  . pravastatin  20 mg Oral Daily  . senna-docusate  2 tablet Oral BID   Continuous Infusions: . 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 Discussed SNF vs home with family today. Jenna Maldonado reports she is going to visit SNFs today and make a decision. Reiterated the poor long term prognosis with family and Jenna Maldonado today. Dr. Leonel Maldonado believes she would benefit from outpatient referral to an autonomic center or academic neurology center. Appears the closest autonomic specialist center is located at [facility]. Spoke with Jenna Maldonado and would prefer to see a neurologist locally. Will get set up for follow up outpatient.  -Spoke with nurse about keeping stockings in place -fludrocortisone 0.2 mg daily day 1, titrate up to 0.3 mg daily over the next week -midodrine  7.5 tid. Discussed with nursing staff only when going to be upright. Can titrate up to 10 mg tid as needed.  -PT/OT, focus on tranfer techniques/sitting in chair  Urinary Retention:  On bethanechol. Monitor urine output.  -d/c foley  -in an out caths as needed -titrate bethanechol 5  to 10 mg tid, will titrate up to max dose 50 mg/day as needed -strict I&O  Nausea/Vomiting: unclear etiology: No further emesis. Nausea unchanged. Consistent with her autonomic dysnfucntion. -erythromycin base 250mg  TID + cont PRN compazine.  -cont protonix bid in case GERD is playing a role.  -boost tid   Constipation: Continue bowel regimen.  -Miralax and Senokot-S  DM2: CBGs 90-110 -A1c 6.0 now. Unclear what it was in the past.  -Continue Levemir 5 units qhs -SSI-s  Hypothyroidism: TSH wnl -Continue home synthroid  Dispo:  Disposition is deferred at this time, awaiting improvement of current medical problems.  Anticipated discharge in approximately 1-2 day(s).   The Jenna Maldonado does have a current PCP Jenna Maldonado) and does need an [facility] hospital follow-up appointment after discharge.  The Jenna Maldonado does not have transportation limitations that hinder transportation to clinic appointments.  .Services Needed at time of discharge: Y = Yes, Blank = No PT:   OT:   RN:   Equipment:   Other:     LOS: 4 days   Jenna Pile, Maldonado I 10/23/2015, 4:20 PM

## 2015-10-23 NOTE — Progress Notes (Signed)
  Date: 10/23/2015  Patient name: Jenna Maldonado  Medical record number: [id number]  Date of birth: 21-Mar-1951   This patient has been seen and the plan of care was discussed with the house staff. Please see Dr. Shanna Cisco note for complete details. I concur with his findings.  Sid Falcon, MD 10/23/2015, 9:45 PM

## 2015-10-24 DIAGNOSIS — E876 Hypokalemia: Secondary | ICD-10-CM

## 2015-10-24 DIAGNOSIS — G908 Other disorders of autonomic nervous system: Secondary | ICD-10-CM

## 2015-10-24 LAB — MAGNESIUM: MAGNESIUM: 2 mg/dL (ref 1.7–2.4)

## 2015-10-24 LAB — BASIC METABOLIC PANEL
ANION GAP: 12 (ref 5–15)
ANION GAP: 12 (ref 5–15)
BUN: 13 mg/dL (ref 6–20)
BUN: 12 mg/dL (ref 6–20)
CALCIUM: 8.1 mg/dL — AB (ref 8.9–10.3)
CHLORIDE: 105 mmol/L (ref 101–111)
CO2: 23 mmol/L (ref 22–32)
CO2: 20 mmol/L — AB (ref 22–32)
CREATININE: 0.68 mg/dL (ref 0.44–1.00)
Calcium: 8.1 mg/dL — ABNORMAL LOW (ref 8.9–10.3)
Chloride: 103 mmol/L (ref 101–111)
Creatinine, Ser: 0.65 mg/dL (ref 0.44–1.00)
GFR calc non Af Amer: >60 mL/min (ref >60)
Glucose, Bld: 88 mg/dL (ref 65–99)
Glucose, Bld: 157 mg/dL — ABNORMAL HIGH (ref 65–99)
Potassium: 5.9 mmol/L — ABNORMAL HIGH (ref 3.5–5.1)
Potassium: 4 mmol/L (ref 3.5–5.1)
SODIUM: 137 mmol/L (ref 135–145)
Sodium: 138 mmol/L (ref 135–145)

## 2015-10-24 LAB — GLUCOSE, CAPILLARY
GLUCOSE-CAPILLARY: 156 mg/dL — AB (ref 65–99)
Glucose-Capillary: 97 mg/dL (ref 65–99)

## 2015-10-24 MED ORDER — ERYTHROMYCIN ETHYLSUCCINATE 400 MG/5ML PO SUSR: 400.0000 mg | Freq: Three times a day (TID) | ORAL | Status: DC

## 2015-10-24 MED ORDER — SENNOSIDES-DOCUSATE SODIUM 8.6-50 MG PO TABS: 2.0000 | ORAL_TABLET | Freq: Two times a day (BID) | ORAL | Status: DC

## 2015-10-24 MED ORDER — BOOST / RESOURCE BREEZE PO LIQD: 1.0000 | Freq: Three times a day (TID) | ORAL | Status: DC

## 2015-10-24 MED ORDER — BETHANECHOL CHLORIDE 10 MG PO TABS: 10.0000 mg | ORAL_TABLET | Freq: Three times a day (TID) | ORAL | Status: DC

## 2015-10-24 MED ORDER — FLUDROCORTISONE ACETATE 0.1 MG PO TABS: 0.2000 mg | ORAL_TABLET | Freq: Every day | ORAL | Status: DC

## 2015-10-24 MED ORDER — POLYETHYLENE GLYCOL 3350 17 G PO PACK: 17.0000 g | PACK | Freq: Every day | ORAL | Status: DC

## 2015-10-24 MED ORDER — ONDANSETRON HCL 4 MG/2ML IJ SOLN: 4.0000 mg | Freq: Three times a day (TID) | INTRAMUSCULAR | Status: DC | PRN

## 2015-10-24 MED ORDER — PROCHLORPERAZINE EDISYLATE 5 MG/ML IJ SOLN: 10.0000 mg | Freq: Four times a day (QID) | INTRAMUSCULAR | Status: DC | PRN

## 2015-10-24 MED ORDER — INSULIN DETEMIR 100 UNIT/ML ~~LOC~~ SOLN: 5.0000 [IU] | Freq: Every day | SUBCUTANEOUS | Status: DC

## 2015-10-24 MED ORDER — MIDODRINE HCL 2.5 MG PO TABS: 7.5000 mg | ORAL_TABLET | Freq: Three times a day (TID) | ORAL | Status: DC | PRN

## 2015-10-24 NOTE — Progress Notes (Signed)
Subjective: No events overnight. Had to have 1x i/o cath for urinary retention. Feels good today, not nauseous. Had small BM overnight. No fevers. Agreeable to go to SNF today if bed available.    Objective: Vital signs in last 24 hours: Filed Vitals:   10/23/15 0526 10/23/15 1349 10/23/15 2126 10/24/15 0519  BP: 169/73 156/74 145/66 153/68  Pulse: 83 92 82 71  Temp: 98.2 F (36.8 C) 98 F (36.7 C) 98.1 F (36.7 C) 98.1 F (36.7 C)  TempSrc: Oral Oral    Resp: 18 20 18 18   Height:      Weight:      SpO2: 97% 95% 96% 96%   Weight change:   Intake/Output Summary (Last 24 hours) at 10/24/15 0711 Last data filed at 10/24/15 [id number]  Gross per 24 hour  Intake   1440 ml  Output   2151 ml  Net   -711 ml    General: alert, obese female, cooperative to examination.  Lungs: normal respiratory effort, no accessory muscle use, normal breath sounds, no crackles. Heart: normal rate, regular rhythm, no murmur, no gallop, and no rub.  Abdomen: soft, non-tender, normal bowel sounds, no distention Extremities: pitting edema to mid calf, Neurologic: alert & oriented X3, + cog wheeling in UEs, strength 5/5 throughout, flat affect  Medications: I have reviewed the patient's current medications. Scheduled Meds: . bethanechol  10 mg Oral TID  . carbamazepine  300 mg Oral BID  . enoxaparin (LOVENOX) injection  40 mg Subcutaneous Q24H  . erythromycin  400 mg Oral TID AC & HS  . feeding supplement  1 Container Oral TID BM  . fludrocortisone  0.2 mg Oral Daily  . FLUoxetine  40 mg Oral Daily  . insulin aspart  0-9 Units Subcutaneous TID WC  . insulin detemir  5 Units Subcutaneous Daily  . levothyroxine  50 mcg Oral QAC breakfast  . pantoprazole  40 mg Oral BID  . polyethylene glycol  17 g Oral Daily  . pravastatin  20 mg Oral Daily  . senna-docusate  2 tablet Oral BID   Continuous Infusions: . 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 Reiterated the poor long term prognosis with family and patient. Dr. Leonel Ramsay believes she would benefit from outpatient referral to an autonomic center or academic neurology center. Appears the closest autonomic specialist center is located at [facility]. Spoke with patient and would prefer to see a neurologist locally. Will get set up for follow up outpatient.  -Spoke with nurse about keeping stockings in place -fludrocortisone 0.2 mg daily day 1, titrate up to 0.3 mg daily over the next week -midodrine  7.5 tid. Discussed with nursing staff only when going to be upright. Can titrate up to 10 mg tid as needed.  -appreciate PT/OT assistance, focus on tranfer techniques/sitting in chair - further rehab at SNF.  Urinary Retention:  On bethanechol. Monitor urine output.  - q8hr bladder scans and  -in an out caths as needed -cont bethanechol 10 mg tid, will titrate up to max dose 50 mg/day as needed  Nausea/Vomiting: unclear etiology: No further emesis. Nausea unchanged. Consistent with her autonomic dysnfucntion. -erythromycin base 250mg  TID + cont PRN compazine.  -cont protonix bid in case GERD is playing a role.  -boost tid   Hypokalemia/hypomag  - repleting prn.   Constipation: Continue bowel  regimen.  -Miralax and Senokot-S  DM2: CBGs 90-110 -A1c 6.0 now. Unclear what it was in the past.  -Continue Levemir 5 units qhs -SSI-s  Hypothyroidism: TSH wnl -Continue home synthroid  Dispo: Disposition is deferred at this time, awaiting improvement of current medical problems.  Anticipated discharge in approximately 1-2 day(s).   The patient does have a current PCP Monico Blitz, MD) and does need an [facility] hospital follow-up appointment after discharge.  The patient does not have transportation limitations that hinder transportation to  clinic appointments.  .Services Needed at time of discharge: Y = Yes, Blank = No PT:   OT:   RN:   Equipment:   Other:     LOS: 5 days   [name], MD I 10/24/2015, 7:11 AM

## 2015-10-24 NOTE — Progress Notes (Signed)
Patient ID: Jenna Maldonado, female   DOB: 15-Dec-1950, 65 y.o.   MRN: [id number] Medicine attending: I examined this patient today together with resident physician Dr. [name] and I concur with his evaluation and management plan which we discussed together. Elderly woman admitted for further evaluation of symptomatic autonomic insufficiency. Condition has stabilized. Midodrine dose increased and Florinef added to her regimen. Reglan not tolerated. Erythromycin added to increase GI motility. She will be discharged to an extended care facility. Family member present. Status and management plan discussed.

## 2015-10-24 NOTE — Clinical Social Work Placement (Signed)
   CLINICAL SOCIAL WORK PLACEMENT  NOTE  Date:  10/24/2015  Patient Details  Name: Jenna Maldonado MRN: [id number] Date of Birth: 1950-10-24  Clinical Social Work is seeking post-discharge placement for this patient at the [place] level of care (*CSW will initial, date and re-position this form in  chart as items are completed):  Yes   Patient/family provided with [place] Work Department's list of facilities offering this level of care within the geographic area requested by the patient (or if unable, by the patient's family).  Yes   Patient/family informed of their freedom to choose among providers that offer the needed level of care, that participate in Medicare, Medicaid or managed care program needed by the patient, have an available bed and are willing to accept the patient.  Yes   Patient/family informed of [facility]'s ownership interest in [facility] and [facility], as well as of the fact that they are under no obligation to receive care at these facilities.  PASRR submitted to EDS on 10/22/15     PASRR number received on 10/22/15     Existing PASRR number confirmed on       FL2 transmitted to all facilities in geographic area requested by pt/family on 10/22/15     FL2 transmitted to all facilities within larger geographic area on       Patient informed that his/her managed care company has contracts with or will negotiate with certain facilities, including the following:        Yes   Patient/family informed of bed offers received.  Patient chooses bed at [facility]     Physician recommends and patient chooses bed at      Patient to be transferred to [facility] on 10/24/15.  Patient to be transferred to facility by PTAR     Patient family notified on 10/24/15 of transfer.  Name of family member notified:  Daughter     PHYSICIAN       Additional Comment:     _______________________________________________ Benard Halsted, [place] 10/24/2015, 2:04 PM

## 2015-10-24 NOTE — Progress Notes (Signed)
Patient will DC to: [place] date: 10/24/15 Family notified: Daughter Transport by: Corey Harold   Per MD patient ready for DC to [facility]. RN, patient, patient's family, and facility notified of DC. RN given number for report. DC packet on chart. Ambulance transport requested for patient.   CSW signing off.  Cedric Fishman, [place] Social Worker [phone]

## 2015-10-24 NOTE — Progress Notes (Signed)
Bladder scan pt and 599cc noted per NT. In and out cath pt with 550cc output. Will cont to monitor

## 2015-10-24 NOTE — Progress Notes (Signed)
Jenna Maldonado to be D/C'd to SNF per MD order.  Discussed with the patient and all questions fully answered.  VSS, Skin clean, dry and intact without evidence of skin break down, no evidence of skin tears noted. IV catheter discontinued intact. Site without signs and symptoms of complications. Dressing and pressure applied.  An After Visit Summary was printed and given to PTAR.   D/c education completed with patient/family including follow up instructions, medication list, d/c activities limitations if indicated, with other d/c instructions as indicated by MD - patient able to verbalize understanding, all questions fully answered.   Patient instructed to return to ED, call 911, or call MD for any changes in condition.   Patient escorted via stretcher, and D/C to SNF via PTAR.  Morley Kos Price 10/24/2015 2:36 PM

## 2015-10-25 LAB — METHYLMALONIC ACID(MMA), RND URINE
Creatinine(Crt), U: 0.5 g/L (ref 0.30–3.00)
METHYLMALONIC ACID UR: 7.3 umol/L (ref 1.6–29.7)
MMA - Normalized: 1.7 umol/mmol cr (ref 0.4–2.5)

## 2015-10-26 ENCOUNTER — Encounter (HOSPITAL_COMMUNITY): Payer: Self-pay | Admitting: Emergency Medicine

## 2015-10-26 ENCOUNTER — Other Ambulatory Visit (HOSPITAL_COMMUNITY)
Admission: RE | Admit: 2015-10-26 | Discharge: 2015-10-26 | Disposition: A | Discharge status: Home / Self Care | Payer: Medicare Other | Source: Skilled Nursing Facility | Attending: *Deleted | Admitting: *Deleted

## 2015-10-26 ENCOUNTER — Emergency Department (HOSPITAL_COMMUNITY)
Admission: EM | Admit: 2015-10-26 | Discharge: 2015-10-26 | Disposition: A | Discharge status: Home / Self Care | Payer: Medicare Other | Attending: Emergency Medicine | Admitting: Emergency Medicine

## 2015-10-26 ENCOUNTER — Other Ambulatory Visit: Payer: Self-pay

## 2015-10-26 ENCOUNTER — Encounter (HOSPITAL_COMMUNITY): Payer: Self-pay | Admitting: Internal Medicine

## 2015-10-26 DIAGNOSIS — Z792 Long term (current) use of antibiotics: Secondary | ICD-10-CM | POA: Insufficient documentation

## 2015-10-26 DIAGNOSIS — E039 Hypothyroidism, unspecified: Secondary | ICD-10-CM | POA: Insufficient documentation

## 2015-10-26 DIAGNOSIS — E876 Hypokalemia: Secondary | ICD-10-CM | POA: Insufficient documentation

## 2015-10-26 DIAGNOSIS — R7989 Other specified abnormal findings of blood chemistry: Secondary | ICD-10-CM | POA: Diagnosis present

## 2015-10-26 DIAGNOSIS — E278 Other specified disorders of adrenal gland: Secondary | ICD-10-CM

## 2015-10-26 DIAGNOSIS — E1143 Type 2 diabetes mellitus with diabetic autonomic (poly)neuropathy: Secondary | ICD-10-CM | POA: Diagnosis not present

## 2015-10-26 DIAGNOSIS — Z79899 Other long term (current) drug therapy: Secondary | ICD-10-CM | POA: Insufficient documentation

## 2015-10-26 DIAGNOSIS — Z794 Long term (current) use of insulin: Secondary | ICD-10-CM | POA: Diagnosis not present

## 2015-10-26 DIAGNOSIS — I951 Orthostatic hypotension: Secondary | ICD-10-CM

## 2015-10-26 DIAGNOSIS — F329 Major depressive disorder, single episode, unspecified: Secondary | ICD-10-CM | POA: Diagnosis not present

## 2015-10-26 HISTORY — DX: Orthostatic hypotension: I95.1

## 2015-10-26 HISTORY — DX: Headache: R51

## 2015-10-26 HISTORY — DX: Hypothyroidism, unspecified: E03.9

## 2015-10-26 HISTORY — DX: Other specified disorders of adrenal gland: E27.8

## 2015-10-26 HISTORY — DX: Type 2 diabetes mellitus with diabetic autonomic (poly)neuropathy: E11.43

## 2015-10-26 HISTORY — DX: Type 2 diabetes mellitus with diabetic neuropathy, unspecified: E11.40

## 2015-10-26 HISTORY — DX: Calculus of kidney: N20.0

## 2015-10-26 HISTORY — DX: Shortness of breath: R06.02

## 2015-10-26 HISTORY — DX: Other constipation: K59.09

## 2015-10-26 HISTORY — DX: Other chronic pain: G89.29

## 2015-10-26 HISTORY — DX: Residual foreign body in soft tissue: M79.5

## 2015-10-26 HISTORY — DX: Bifascicular block: I45.2

## 2015-10-26 HISTORY — DX: Gastro-esophageal reflux disease without esophagitis: K21.9

## 2015-10-26 LAB — CBC WITH DIFFERENTIAL/PLATELET
Basophils Absolute: 0 10*3/uL (ref 0.0–0.1)
Basophils Relative: 0 %
EOS PCT: 1 %
Eosinophils Absolute: 0.1 10*3/uL (ref 0.0–0.7)
HCT: 38.6 % (ref 36.0–46.0)
Hemoglobin: 13.5 g/dL (ref 12.0–15.0)
LYMPHS ABS: 1.8 10*3/uL (ref 0.7–4.0)
LYMPHS PCT: 34 %
MCH: 29.7 pg (ref 26.0–34.0)
MCHC: 35 g/dL (ref 30.0–36.0)
MCV: 85 fL (ref 78.0–100.0)
MONOS PCT: 10 %
Monocytes Absolute: 0.5 10*3/uL (ref 0.1–1.0)
Neutro Abs: 3 10*3/uL (ref 1.7–7.7)
Neutrophils Relative %: 55 %
PLATELETS: 156 10*3/uL (ref 150–400)
RBC: 4.54 MIL/uL (ref 3.87–5.11)
RDW: 12.5 % (ref 11.5–15.5)
WBC: 5.4 10*3/uL (ref 4.0–10.5)

## 2015-10-26 LAB — BASIC METABOLIC PANEL
Anion gap: 6 (ref 5–15)
Anion gap: 9 (ref 5–15)
BUN: 13 mg/dL (ref 6–20)
BUN: 13 mg/dL (ref 6–20)
CHLORIDE: 97 mmol/L — AB (ref 101–111)
CHLORIDE: 97 mmol/L — AB (ref 101–111)
CO2: 33 mmol/L — AB (ref 22–32)
CO2: 31 mmol/L (ref 22–32)
CREATININE: 0.56 mg/dL (ref 0.44–1.00)
CREATININE: 0.5 mg/dL (ref 0.44–1.00)
Calcium: 7.9 mg/dL — ABNORMAL LOW (ref 8.9–10.3)
Calcium: 8.2 mg/dL — ABNORMAL LOW (ref 8.9–10.3)
GFR calc Af Amer: >60 mL/min (ref >60)
GFR calc Af Amer: >60 mL/min (ref >60)
GFR calc non Af Amer: >60 mL/min (ref >60)
GFR calc non Af Amer: >60 mL/min (ref >60)
GLUCOSE: 128 mg/dL — AB (ref 65–99)
GLUCOSE: 147 mg/dL — AB (ref 65–99)
POTASSIUM: 2.6 mmol/L — AB (ref 3.5–5.1)
POTASSIUM: 2.7 mmol/L — AB (ref 3.5–5.1)
Sodium: 136 mmol/L (ref 135–145)
Sodium: 137 mmol/L (ref 135–145)

## 2015-10-26 MED ORDER — POTASSIUM CHLORIDE ER 10 MEQ PO TBCR: 20.0000 meq | EXTENDED_RELEASE_TABLET | Freq: Two times a day (BID) | ORAL | Status: DC

## 2015-10-26 MED ORDER — POTASSIUM CHLORIDE 10 MEQ/100ML IV SOLN
10.0000 meq | Freq: Once | INTRAVENOUS | Status: AC
  Administered 2015-10-26: 10 meq via INTRAVENOUS
  Filled 2015-10-26: qty 100

## 2015-10-26 MED ORDER — POTASSIUM CHLORIDE CRYS ER 20 MEQ PO TBCR
40.0000 meq | EXTENDED_RELEASE_TABLET | Freq: Once | ORAL | Status: AC
  Administered 2015-10-26: 40 meq via ORAL
  Filled 2015-10-26: qty 2

## 2015-10-26 NOTE — ED Notes (Signed)
Pt denies any complaints.  Daughter at bedside.

## 2015-10-26 NOTE — Discharge Instructions (Signed)
Potassium replacement as prescribed.  Have your potassium rechecked in the next 2-3 days.  Return to the ER if symptoms significantly worsen or change.   Hypokalemia Hypokalemia means that the amount of potassium in the blood is lower than normal.Potassium is a chemical, called an electrolyte, that helps regulate the amount of fluid in the body. It also stimulates muscle contraction and helps nerves function properly.Most of the body's potassium is inside of cells, and only a very small amount is in the blood. Because the amount in the blood is so small, minor changes can be life-threatening. CAUSES  Antibiotics.  Diarrhea or vomiting.  Using laxatives too much, which can cause diarrhea.  Chronic kidney disease.  Water pills (diuretics).  Eating disorders (bulimia).  Low magnesium level.  Sweating a lot. SIGNS AND SYMPTOMS  Weakness.  Constipation.  Fatigue.  Muscle cramps.  Mental confusion.  Skipped heartbeats or irregular heartbeat (palpitations).  Tingling or numbness. DIAGNOSIS  Your health care provider can diagnose hypokalemia with blood tests. In addition to checking your potassium level, your health care provider may also check other lab tests. TREATMENT Hypokalemia can be treated with potassium supplements taken by mouth or adjustments in your current medicines. If your potassium level is very low, you may need to get potassium through a vein (IV) and be monitored in the hospital. A diet high in potassium is also helpful. Foods high in potassium are:  Nuts, such as peanuts and pistachios.  Seeds, such as sunflower seeds and pumpkin seeds.  Peas, lentils, and lima beans.  Whole grain and bran cereals and breads.  Fresh fruit and vegetables, such as apricots, avocado, bananas, cantaloupe, kiwi, oranges, tomatoes, asparagus, and potatoes.  Orange and tomato juices.  Red meats.  Fruit yogurt. HOME CARE INSTRUCTIONS  Take all medicines as  prescribed by your health care provider.  Maintain a healthy diet by including nutritious food, such as fruits, vegetables, nuts, whole grains, and lean meats.  If you are taking a laxative, be sure to follow the directions on the label. SEEK MEDICAL CARE IF:  Your weakness gets worse.  You feel your heart pounding or racing.  You are vomiting or having diarrhea.  You are diabetic and having trouble keeping your blood glucose in the normal range. SEEK IMMEDIATE MEDICAL CARE IF:  You have chest pain, shortness of breath, or dizziness.  You are vomiting or having diarrhea for more than 2 days.  You faint. MAKE SURE YOU:   Understand these instructions.  Will watch your condition.  Will get help right away if you are not doing well or get worse.   This information is not intended to replace advice given to you by your health care provider. Make sure you discuss any questions you have with your health care provider.   Document Released: 05/30/2005 Document Revised: 06/20/2014 Document Reviewed: 11/30/2012 Elsevier Interactive Patient Education [employer].

## 2015-10-26 NOTE — ED Provider Notes (Signed)
CSN: [id number]     Arrival date & time 10/26/15  0919 History  By signing my name below, I, Terrance Branch, attest that this documentation has been prepared under the direction and in the presence of Veryl Speak, MD. Electronically Signed: Randa Evens, ED Scribe. 10/26/2015. 9:30 AM.      Chief Complaint  Patient presents with  . Abnormal Lab    The history is provided by the patient. No language interpreter was used.   HPI Comments: Jenna Maldonado is a 65 y.o. female who presents to the Emergency Department from the [facility] [place] center for abnormal labs today. Pt was sent to the ED for low potassium. Pt does report that she has had diarrhea. Pt doesn't report any other associated symptoms. Pt denies CP or trouble breathing.   Past Medical History  Diagnosis Date  . Depression   . Orthostatic hypotension   . Chronic headaches   . Residual foreign body in soft tissue   . Chronic constipation   . Shortness of breath   . Diabetic neuropathy ([place])   . 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 Diabetic autonomic neuropathy ([place])   . Esophageal reflux   . Hypothyroidism, adult   . Calculus of kidney     L inferior kidney ; non obstructing   Past Surgical History  Procedure Laterality Date  . Shoulder surgery Left   . Tubal ligation    . Hand surgery Bilateral 2001  . Eye surgrey Bilateral     4-5 times   . Carpel tunnel    . Cholecystectomy      June 2016   Family History  Problem Relation Age of Onset  . Cancer Mother   . Suicidality Father     GSW   Social History  Substance Use Topics  . Smoking status: Never Smoker   . Smokeless tobacco: Never Used  . Alcohol Use: No   OB History    No data available      Review of Systems  Respiratory: Negative for shortness of breath.   Cardiovascular: Negative for chest pain.  Gastrointestinal: Positive for diarrhea.  All other systems reviewed and are  negative.    Allergies  Atorvastatin; Ciprofloxacin; Codeine; Flagyl; Hydrocodone; Pioglitazone; and Sulfa antibiotics  Home Medications   Prior to Admission medications   Medication Sig Start Date End Date Taking? Authorizing Provider  ACCU-CHEK SMARTVIEW test strip Reported on 05/27/2015 03/25/13  Yes Historical Provider, MD  bethanechol (URECHOLINE) 10 MG tablet Take 1 tablet (10 mg total) by mouth 3 (three) times daily. 10/24/15  Yes Tasrif Ahmed, MD  carbamazepine (TEGRETOL XR) 100 MG 12 hr tablet Take 300 mg by mouth 2 (two) times daily.    Yes Historical Provider, MD  erythromycin (EES) 400 MG/5ML suspension Take 5 mLs (400 mg total) by mouth 4 (four) times daily -  before meals and at bedtime. 10/24/15  Yes Tasrif Ahmed, MD  feeding supplement (BOOST / RESOURCE BREEZE) LIQD Take 1 Container by mouth 3 (three) times daily between meals. 10/24/15  Yes Tasrif Ahmed, MD  fludrocortisone (FLORINEF) 0.1 MG tablet Take 2 tablets (0.2 mg total) by mouth daily. 10/24/15  Yes Tasrif Ahmed, MD  FLUoxetine (PROZAC) 40 MG capsule Take 40 mg by mouth daily.  12/30/12  Yes Historical Provider, MD  insulin detemir (LEVEMIR) 100 UNIT/ML injection Inject 0.05 mLs (5 Units total) into the skin daily. 10/24/15  Yes  [name], MD  levothyroxine (SYNTHROID, LEVOTHROID) 50 MCG tablet Take 50 mcg by mouth daily before breakfast.   Yes Historical Provider, MD  midodrine (PROAMATINE) 2.5 MG tablet Take 3 tablets (7.5 mg total) by mouth 3 (three) times daily as needed (Before standing up or sitting up in chair. Do not give when patient is going to be lying in bed.). 10/24/15  Yes Tasrif Ahmed, MD  omeprazole (PRILOSEC) 20 MG capsule Take 20 mg by mouth 2 (two) times daily before a meal.  10/11/12  Yes Historical Provider, MD  ondansetron (ZOFRAN) 4 MG/2ML SOLN injection Inject 2 mLs (4 mg total) into the vein every 8 (eight) hours as needed for nausea or vomiting. 10/24/15  Yes Tasrif Ahmed, MD  polyethylene glycol  (MIRALAX / GLYCOLAX) packet Take 17 g by mouth daily. 10/24/15  Yes Tasrif Ahmed, MD  pravastatin (PRAVACHOL) 20 MG tablet Take 1 tablet by mouth daily. 09/22/14  Yes Historical Provider, MD  prochlorperazine (COMPAZINE) 10 MG tablet Take 10 mg by mouth every 6 (six) hours as needed for nausea or vomiting.   Yes Historical Provider, MD  senna-docusate (SENOKOT-S) 8.6-50 MG tablet Take 2 tablets by mouth 2 (two) times daily. 10/24/15  Yes Tasrif Ahmed, MD  prochlorperazine (COMPAZINE) 5 MG/ML injection Inject 2 mLs (10 mg total) into the vein every 6 (six) hours as needed. Patient not taking: Reported on 10/26/2015 10/24/15   Tasrif Ahmed, MD   BP 165/92 mmHg  Pulse 79  Temp(Src) 98 F (36.7 C) (Oral)  Resp 17  Ht 5\' 3"  (1.6 m)  Wt 170 lb (77.111 kg)  BMI 30.12 kg/m2  SpO2 95%   Physical Exam  Constitutional: She is oriented to person, place, and time. She appears well-developed and well-nourished. No distress.  HENT:  Head: Normocephalic and atraumatic.  Eyes: Conjunctivae and EOM are normal.  Neck: Neck supple. No tracheal deviation present.  Cardiovascular: Normal rate, regular rhythm and normal heart sounds.   Pulmonary/Chest: Effort normal and breath sounds normal. No respiratory distress. She has no wheezes. She has no rales.  Musculoskeletal: Normal range of motion.  Neurological: She is alert and oriented to person, place, and time.  Skin: Skin is warm and dry.  Psychiatric: She has a normal mood and affect. Her behavior is normal.  Nursing note and vitals reviewed.   ED Course  Procedures (including critical care time) DIAGNOSTIC STUDIES: Oxygen Saturation is 100% on RA, normal by my interpretation.    COORDINATION OF CARE: 9:30 AM-Discussed treatment plan with pt at bedside and pt agreed to plan.     Labs Review Labs Reviewed  BASIC METABOLIC PANEL - Abnormal; Notable for the following:    Potassium 2.7 (*)    Chloride 97 (*)    Glucose, Bld 147 (*)    Calcium 8.2  (*)    All other components within normal limits  CBC WITH DIFFERENTIAL/PLATELET    Imaging Review No results found. I have personally reviewed and evaluated these lab results as part of my medical decision-making.   EKG Interpretation   Date/Time:  Monday Oct 26 2015 09:35:48 EDT Ventricular Rate:  76 PR Interval:  107 QRS Duration: 153 QT Interval:  459 QTC Calculation: 516 R Axis:   106 Text Interpretation:  Sinus rhythm Short PR interval RBBB and LPFB  Confirmed by Rateel Beldin  MD, Imunique Samad ([id number]) on 10/26/2015 9:56:19 AM      MDM   Final diagnoses:  None      Patient was  sent for evaluation of a low potassium. She has no other complaints. Potassium was repeated and was 2.7. She was given doses of intravenous potassium and one oral dose. She will be discharged with oral potassium and a recheck in 2 days.   I personally performed the services described in this documentation, which was scribed in my presence. The recorded information has been reviewed and is accurate.       Veryl Speak, MD 10/26/15 [phone]

## 2015-10-26 NOTE — ED Notes (Signed)
Called [facility] for transport back to facility.

## 2015-10-26 NOTE — ED Notes (Signed)
Pt sent from the [facility] for evaluation of low potassium.

## 2015-10-27 ENCOUNTER — Non-Acute Institutional Stay (SKILLED_NURSING_FACILITY): Payer: Medicare Other | Admitting: Internal Medicine

## 2015-10-27 ENCOUNTER — Encounter: Payer: Self-pay | Admitting: Internal Medicine

## 2015-10-27 DIAGNOSIS — I951 Orthostatic hypotension: Secondary | ICD-10-CM

## 2015-10-27 DIAGNOSIS — G4733 Obstructive sleep apnea (adult) (pediatric): Secondary | ICD-10-CM | POA: Diagnosis not present

## 2015-10-27 DIAGNOSIS — R55 Syncope and collapse: Secondary | ICD-10-CM | POA: Diagnosis not present

## 2015-10-27 DIAGNOSIS — E1149 Type 2 diabetes mellitus with other diabetic neurological complication: Secondary | ICD-10-CM

## 2015-10-27 DIAGNOSIS — H3530 Unspecified macular degeneration: Secondary | ICD-10-CM | POA: Insufficient documentation

## 2015-10-27 DIAGNOSIS — E876 Hypokalemia: Secondary | ICD-10-CM

## 2015-10-27 NOTE — Assessment & Plan Note (Signed)
Monitor postural blood pressures

## 2015-10-27 NOTE — Assessment & Plan Note (Addendum)
Monitor for exacerbation of diabetes on the Florinef  At this time the major concern is hypoglycemia (see history)

## 2015-10-27 NOTE — Assessment & Plan Note (Signed)
Monitor potassium on the Florinef

## 2015-10-27 NOTE — Progress Notes (Signed)
Facility Location: [place] Room Number: 159  PCP: Monico Blitz, MD [address] [place] [address]    This is a comprehensive admission note to [facility] personally performed by Unice Cobble MD on this date less than 30 days from date of admission. Included are preadmission medical/surgical history;reconciled medication list; family history; social history and comprehensive review of systems.  Corrections and additions to the records were documented . Comprehensive physical exam was also performed. Additionally a clinical summary was entered for each active diagnosis pertinent to this admission in the Problem List to enhance continuity of care.  HPI: She was hospitalized 5/3-5/13/17 for orthostatic hypotension. Presentation was as dizziness, near syncope, and emesis. She was felt to have autonomic dysfunction by Dr. Leonel Ramsay, neurologist and was treated with titrated doses of Florinef as well as midodrine . The latter was to be titrated up to 7.5-10  mg 3 times a day.  Additionally she was to wear compression stockings to thigh or hip level. Fasting cortisol was normal. 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   She had previously been seen 5/2 at [facility] for syncope ; echo and  stress tests were negative.  Her most recent hospitalization was complicated by low potassium and magnesium.  The nausea and vomiting was attributed to possible diabetic gastroparesis. She had been treated with Reglan without benefit. Subsequently she was switched to Compazine and erythromycin.  Past medical and surgical history: She has a diagnosis of diabetes with neuropathy. Recent A1c was 6% ;glucoses were documented to range 90-110.  She's also had urinary retention attributed to neurogenic bladder. She had been treated with Bethanechol. She also has a history of spinal stenosis.  Social history:Verified  Family  history:Updated  Comprehensive review of systems: She has intermittent right temple or right occipital headaches which respond to Tylenol. She describes intermittent facial numbness and tingling. She also has numbness and tingling in her extremities, greater in the feet. Night sweats without other constitutional symptoms. With the recent illness she has lost 10 pounds from 171-161. The hospital course was complicated by urinary retention for which Foley catheter was placed. She states she has obstructive sleep apnea and has a CPAP machine which she does not use. She stated she is  "just lazy". She admits to being depressed by her present status. Recent symptoms include nocturnal stool incontinence. Fasting glucoses at home but been in the range of 50-60; low has been  40. Constitutional: No fever,chills  Eyes: No redness, discharge, pain ENT/mouth: No nasal congestion,  purulent discharge, earache,change in hearing ,sore throat  Cardiovascular: No chest pain, palpitations,paroxysmal nocturnal dyspnea, claudication, edema  Respiratory: No cough, sputum production,hemoptysis, DOE , significant snoring,apnea   Gastrointestinal: No heartburn,dysphagia,abdominal pain, rectal bleeding, melena Genitourinary: No dysuria,hematuria, pyuria,  incontinence, nocturia Musculoskeletal: No joint stiffness, joint swelling, weakness,pain Dermatologic: No rash, pruritus, change in appearance of skin Neurologic: No seizures Endocrine: No change in hair/skin/ nails, excessive thirst, excessive hunger, excessive urination  Hematologic/lymphatic: No significant bruising, lymphadenopathy,abnormal bleeding Allergy/immunology: No itchy/ watery eyes, significant sneezing, urticaria, angioedema  Physical exam:  Pertinent or positive findings: Affect is markedly flat and facies somewhat masked. She has slight ptosis on the left. She has  few remaining teeth. She states she has a partial but did not want to manage  this while at [facility]. Loss of the eyebrows laterally. Heart sounds are somewhat distant. Strength is decreased. Deep tendon reflexes are  0-1/2+ and equal. Pedal pulses are decreased. She is wearing support hose.   General appearance:Adequately nourished; no acute distress , increased work of breathing is present.   Lymphatic: No lymphadenopathy about the head, neck, axilla . Eyes: No conjunctival inflammation or lid edema is present. There is no scleral icterus. Ears:  External ear exam shows no significant lesions or deformities.   Nose:  External nasal examination shows no deformity or inflammation. Nasal mucosa are pink and moist without lesions ,exudates Oral exam: lips and gums are healthy appearing.There is no oropharyngeal erythema or exudate . Neck:  No thyromegaly, masses, tenderness noted.    Heart:  Normal rate and regular rhythm. S1 and S2 normal without gallop, murmur, click, rub .  Lungs:Chest clear to auscultation without wheezes, rhonchi,rales , rubs. Abdomen:Bowel sounds are normal. Abdomen is soft and nontender with no organomegaly, hernias,masses. GU: deferred as previously addressed. Extremities:  No cyanosis, clubbing,edema  Neurologic exam : Cn 2-7 intact Balance,Rhomberg,finger to nose testing could not be completed due to clinical state Deep tendon reflexes are equal Skin: Warm & dry w/o tenting. No significant lesions or rash.  See clinical summary under each active problem in the Problem List with associated updated therapeutic plan

## 2015-10-27 NOTE — Patient Instructions (Signed)
To perform isometric exercise of calves  ( while seated go up on toes to count of 5 & then onto heels for 5 count). Repeat  4- 5 times prior to standing . To stand only with two person assist. Check postural BP daily ( sitting and standing) BMET in am

## 2015-10-28 ENCOUNTER — Encounter (HOSPITAL_COMMUNITY)
Admission: RE | Admit: 2015-10-28 | Discharge: 2015-10-28 | Disposition: A | Discharge status: Home / Self Care | Payer: Medicare Other | Source: Skilled Nursing Facility | Attending: Internal Medicine | Admitting: Internal Medicine

## 2015-10-28 ENCOUNTER — Ambulatory Visit: Payer: Medicare Other | Admitting: Urology

## 2015-10-28 DIAGNOSIS — E1140 Type 2 diabetes mellitus with diabetic neuropathy, unspecified: Secondary | ICD-10-CM | POA: Insufficient documentation

## 2015-10-28 DIAGNOSIS — N200 Calculus of kidney: Secondary | ICD-10-CM | POA: Insufficient documentation

## 2015-10-28 DIAGNOSIS — R42 Dizziness and giddiness: Secondary | ICD-10-CM | POA: Insufficient documentation

## 2015-10-28 DIAGNOSIS — E1143 Type 2 diabetes mellitus with diabetic autonomic (poly)neuropathy: Secondary | ICD-10-CM | POA: Insufficient documentation

## 2015-10-28 DIAGNOSIS — E039 Hypothyroidism, unspecified: Secondary | ICD-10-CM | POA: Insufficient documentation

## 2015-10-28 DIAGNOSIS — R112 Nausea with vomiting, unspecified: Secondary | ICD-10-CM | POA: Insufficient documentation

## 2015-10-28 DIAGNOSIS — E278 Other specified disorders of adrenal gland: Secondary | ICD-10-CM | POA: Insufficient documentation

## 2015-10-28 DIAGNOSIS — E1165 Type 2 diabetes mellitus with hyperglycemia: Secondary | ICD-10-CM | POA: Insufficient documentation

## 2015-10-28 LAB — BASIC METABOLIC PANEL
ANION GAP: 5 (ref 5–15)
BUN: 14 mg/dL (ref 4–21)
BUN: 14 mg/dL (ref 6–20)
CALCIUM: 8.2 mg/dL — AB (ref 8.9–10.3)
CO2: 33 mmol/L — AB (ref 22–32)
CREATININE: 0.5 mg/dL (ref <=1.1)
Chloride: 100 mmol/L — ABNORMAL LOW (ref 101–111)
Creatinine, Ser: 0.53 mg/dL (ref 0.44–1.00)
Glucose, Bld: 166 mg/dL — ABNORMAL HIGH (ref 65–99)
Glucose: 166 mg/dL
Potassium: 3.2 mmol/L — ABNORMAL LOW (ref 3.5–5.1)
SODIUM: 138 mmol/L (ref 137–147)
SODIUM: 138 mmol/L (ref 135–145)

## 2015-10-29 ENCOUNTER — Non-Acute Institutional Stay (SKILLED_NURSING_FACILITY): Payer: Medicare Other | Admitting: Internal Medicine

## 2015-10-29 ENCOUNTER — Encounter: Payer: Self-pay | Admitting: Internal Medicine

## 2015-10-29 ENCOUNTER — Encounter (HOSPITAL_COMMUNITY)
Admission: RE | Admit: 2015-10-29 | Discharge: 2015-10-29 | Disposition: A | Discharge status: Home / Self Care | Payer: Medicare Other | Source: Skilled Nursing Facility | Attending: *Deleted | Admitting: *Deleted

## 2015-10-29 DIAGNOSIS — R319 Hematuria, unspecified: Secondary | ICD-10-CM

## 2015-10-29 DIAGNOSIS — E876 Hypokalemia: Secondary | ICD-10-CM

## 2015-10-29 DIAGNOSIS — I951 Orthostatic hypotension: Secondary | ICD-10-CM | POA: Diagnosis not present

## 2015-10-29 DIAGNOSIS — R112 Nausea with vomiting, unspecified: Secondary | ICD-10-CM | POA: Diagnosis not present

## 2015-10-29 LAB — BASIC METABOLIC PANEL
ANION GAP: 5 (ref 5–15)
BUN: 14 mg/dL (ref 6–20)
CALCIUM: 8.3 mg/dL — AB (ref 8.9–10.3)
CO2: 33 mmol/L — ABNORMAL HIGH (ref 22–32)
Chloride: 100 mmol/L — ABNORMAL LOW (ref 101–111)
Creatinine, Ser: 0.51 mg/dL (ref 0.44–1.00)
GLUCOSE: 200 mg/dL — AB (ref 65–99)
POTASSIUM: 3.5 mmol/L (ref 3.5–5.1)
Sodium: 138 mmol/L (ref 135–145)

## 2015-10-29 NOTE — Assessment & Plan Note (Signed)
10/29/15 subjectively improved "40%" Continue the Florinef and monitor potassium and diabetic status

## 2015-10-29 NOTE — Assessment & Plan Note (Addendum)
Although the medical literature suggests less than a 50% response to this agent for emesis;she feels it has been of significant benefit. Attempt to wean it will be initiated in 72 hours

## 2015-10-29 NOTE — Patient Instructions (Signed)
Potassium will be monitored every other day at this point area Urinalysis and culture and sensitivity because of hematuria.

## 2015-10-29 NOTE — Progress Notes (Signed)
Jenna Maldonado female , DOB: [birth year]   MRN: [id number]     Facility Location: [place] Room Number: 159-P  PCP: Monico Blitz, MD [place] [address]   This is a nursing facility follow up for specific acute issues  Interim medical record and care since last [place] visit was updated with review of diagnostic studies and change in clinical status since last visit were documented.  HPI: She states that she is at least 40% improved overall. She did experience flushing in PT during exercises. Blood pressure was noted to be 77/59. She feels that the erythromycin is helping her emesis. She infers that it was intractable prior to initiation of this agent.  Comprehensive review of systems: She has had low back pain intermittently over the last 2 days. This is an acute on chronic issue. She describes pain related to the catheter with position change. She also has noted blood in the Foley catheter during the same period time.  She has no other GU symptoms.  Constitutional: No fever,significant weight change, fatigue  Cardiovascular: No chest pain, palpitations,paroxysmal nocturnal dyspnea, claudication, edema  Respiratory: No cough, sputum production,hemoptysis, DOE , significant snoring,apnea   Gastrointestinal: No heartburn,dysphagia,abdominal pain, nausea / vomiting,rectal bleeding, melena,change in bowels Genitourinary: No dysuria,hematuria, pyuria,  incontinence, nocturia Musculoskeletal: No joint stiffness, joint swelling, weakness,pain Dermatologic: No rash, pruritus, change in appearance of skin Neurologic: No syncope, seizures, numbness , tingling Endocrine: No change in hair/skin/ nails, excessive thirst, excessive hunger, excessive urination  Hematologic/lymphatic: No significant lymphadenopathy,abnormal bleeding  Physical exam:  Pertinent or positive findings: She appears much stronger and alert. Her affect remains flat. She has a few remaining  teeth and she's not wearing her plate or partial. Ptosis present bilaterally, greater on the right. The urine is heme tinged in the Foley catheter.  She is weaker in the upper extremities than the lower. DTRs are 0-1/2+. Bruising is noted over the left biceps. General appearance:Adequately nourished; no acute distress , increased work of breathing is present.   Lymphatic: No lymphadenopathy about the head, neck, axilla . Eyes: No conjunctival inflammation or lid edema is present. There is no scleral icterus. Ears:  External ear exam shows no significant lesions or deformities.   Nose:  External nasal examination shows no deformity or inflammation. Nasal mucosa are pink and moist without lesions ,exudates Oral exam: lips and gums are healthy appearing.There is no oropharyngeal erythema or exudate . Neck:  No thyromegaly, masses, tenderness noted.    Heart:  Normal rate and regular rhythm. S1 and S2 normal without gallop, murmur, click, rub .  Lungs:Chest clear to auscultation without wheezes, rhonchi,rales , rubs. Abdomen:Bowel sounds are normal. Abdomen is soft and nontender with no organomegaly, hernias,masses. GU: deferred  Extremities:  No cyanosis, clubbing,edema . Support hose being worn Neurologic exam : Strength equal  in upper & lower extremities Balance,Rhomberg,finger to nose testing could not be completed due to clinical state Deep tendon reflexes are equal Skin: Warm & dry w/o tenting. No significant lesions or rash.    See summary under each active problem in the Problem List with associated updated therapeutic plan #3 hematuria See orders

## 2015-10-29 NOTE — Assessment & Plan Note (Signed)
10/29/15 potassium 3.5 on 20  mEq twice a hypokalemia most likely related to Florinef prescribed for postural hypotension Monitor K+

## 2015-10-30 ENCOUNTER — Encounter (HOSPITAL_COMMUNITY)
Admission: AD | Admit: 2015-10-30 | Discharge: 2015-10-30 | Disposition: A | Discharge status: Home / Self Care | Payer: Medicare Other | Source: Skilled Nursing Facility | Attending: *Deleted | Admitting: *Deleted

## 2015-10-30 DIAGNOSIS — N200 Calculus of kidney: Secondary | ICD-10-CM

## 2015-10-30 LAB — URINE MICROSCOPIC-ADD ON

## 2015-10-30 LAB — URINALYSIS, ROUTINE W REFLEX MICROSCOPIC
BILIRUBIN URINE: NEGATIVE
NITRITE: POSITIVE — AB
PH: 5.5 (ref 5.0–8.0)
Protein, ur: 30 mg/dL — AB
SPECIFIC GRAVITY, URINE: 1.025 (ref 1.005–1.030)

## 2015-10-30 LAB — MISC LABCORP TEST (SEND OUT): Labcorp test code: 9985

## 2015-10-31 ENCOUNTER — Other Ambulatory Visit (HOSPITAL_COMMUNITY)
Admission: AD | Admit: 2015-10-31 | Discharge: 2015-10-31 | Disposition: A | Discharge status: Home / Self Care | Payer: Medicare Other | Source: Skilled Nursing Facility | Attending: Internal Medicine | Admitting: Internal Medicine

## 2015-10-31 LAB — BASIC METABOLIC PANEL
ANION GAP: 6 (ref 5–15)
BUN: 13 mg/dL (ref 6–20)
CALCIUM: 8.5 mg/dL — AB (ref 8.9–10.3)
CO2: 30 mmol/L (ref 22–32)
Chloride: 101 mmol/L (ref 101–111)
Creatinine, Ser: 0.57 mg/dL (ref 0.44–1.00)
GLUCOSE: 185 mg/dL — AB (ref 65–99)
POTASSIUM: 4 mmol/L (ref 3.5–5.1)
Sodium: 137 mmol/L (ref 135–145)

## 2015-10-31 LAB — URINE CULTURE

## 2015-11-01 ENCOUNTER — Other Ambulatory Visit (HOSPITAL_COMMUNITY)
Admission: AD | Admit: 2015-11-01 | Discharge: 2015-11-01 | Disposition: A | Discharge status: Home / Self Care | Payer: Medicare Other | Source: Skilled Nursing Facility | Attending: Internal Medicine | Admitting: Internal Medicine

## 2015-11-01 DIAGNOSIS — N390 Urinary tract infection, site not specified: Secondary | ICD-10-CM | POA: Insufficient documentation

## 2015-11-01 LAB — URINALYSIS, ROUTINE W REFLEX MICROSCOPIC
Bilirubin Urine: NEGATIVE
Ketones, ur: NEGATIVE mg/dL
Nitrite: NEGATIVE
Protein, ur: 30 mg/dL — AB
Specific Gravity, Urine: 1.01 (ref 1.005–1.030)
pH: 6.5 (ref 5.0–8.0)

## 2015-11-01 LAB — URINE MICROSCOPIC-ADD ON

## 2015-11-02 ENCOUNTER — Encounter (HOSPITAL_COMMUNITY)
Admission: RE | Admit: 2015-11-02 | Discharge: 2015-11-02 | Disposition: A | Discharge status: Home / Self Care | Payer: Medicare Other | Source: Skilled Nursing Facility | Attending: *Deleted | Admitting: *Deleted

## 2015-11-02 LAB — BASIC METABOLIC PANEL
Anion gap: 6 (ref 5–15)
BUN: 12 mg/dL (ref 6–20)
CALCIUM: 8.7 mg/dL — AB (ref 8.9–10.3)
CO2: 31 mmol/L (ref 22–32)
CREATININE: 0.53 mg/dL (ref 0.44–1.00)
Chloride: 99 mmol/L — ABNORMAL LOW (ref 101–111)
Glucose, Bld: 200 mg/dL — ABNORMAL HIGH (ref 65–99)
Potassium: 3.9 mmol/L (ref 3.5–5.1)
SODIUM: 136 mmol/L (ref 135–145)

## 2015-11-03 ENCOUNTER — Encounter: Payer: Self-pay | Admitting: Internal Medicine

## 2015-11-03 ENCOUNTER — Non-Acute Institutional Stay (SKILLED_NURSING_FACILITY): Payer: Medicare Other | Admitting: Internal Medicine

## 2015-11-03 DIAGNOSIS — I951 Orthostatic hypotension: Secondary | ICD-10-CM

## 2015-11-03 DIAGNOSIS — N200 Calculus of kidney: Secondary | ICD-10-CM

## 2015-11-03 DIAGNOSIS — R112 Nausea with vomiting, unspecified: Secondary | ICD-10-CM

## 2015-11-03 DIAGNOSIS — E876 Hypokalemia: Secondary | ICD-10-CM | POA: Diagnosis not present

## 2015-11-03 LAB — URINE CULTURE

## 2015-11-03 NOTE — Assessment & Plan Note (Signed)
11/03/15 potassiums have ranged from 3. 5-4 over the last 5 days despite continuing the Florinef

## 2015-11-03 NOTE — Progress Notes (Signed)
Patient ID: Jenna Maldonado, female   DOB: May 27, 1951, 65 y.o.   MRN: [id number]    [place] Room:159  Chief Complaint  Patient presents with  . Acute Visit    Hypotension, Hypokalemia and Nausea   Allergies  Allergen Reactions  . Atorvastatin Other (See Comments)    intolerance  . Ciprofloxacin Itching  . Codeine Nausea And Vomiting  . Flagyl [Metronidazole] Itching  . Hydrocodone Nausea And Vomiting  . Pioglitazone Other (See Comments)    Weight gain  . Sulfa Antibiotics Other (See Comments)    intolerance    This is a nursing facility follow up for specific acute issues of nausea and emesis for which she was receiving erythromycin solution and hypokalemia in the context of Florinef therapy for orthostatic hypotension. Also she has had some macroscopic hematuria in the context of known renal calculus.  Interim medical record and care since last [place] visit was updated with review of diagnostic studies and change in clinical status since last visit were documented.  HPI: Erythromycin solution had been prescribed off label for the intractable nausea and vomiting. Problematic ,obviously is its cost of $2000 a month. The few documented postural blood pressures do not show significant drop standing from a sitting position.  Over the last 5 days the potassium has varied from 3.5-4 on Florinef. As expected she has had significant hyperglycemia on the Florinef. Urine culture revealed multiples species; reculture ordered.Calcium oxalate crystals were found in the urine. She has a calculus in the left inferior kidney. She states that she was seen by [facility] today and the Foley catheter removed.I saw no note in Epic from Urology. She has not voided since the catheter was removed.   Comprehensive review of systems: She had nausea and vomiting once yesterday. The nausea has persisted. On the oral antibiotic she's had loose stool but not diarrheal stool.  She has  intermittent sensation of being hot or cold. With the sensation of heat she does sweat. She has no definite symptoms of an upper or lower respiratory tract infection .  She has blurred vision but this is in the context of her macular degeneration. She denies any paroxysmal nocturnal dyspnea symptoms but does get lightheaded when she stands.    Physical exam:  Pertinent or positive findings:She is lying in bed as she is restricted from standing without help. A few isolated teeth remain. Posterior tibial pulses are slightly decreased. Affect is flat   General appearance:Adequately nourished; no acute distress , increased work of breathing is present.   Lymphatic: No lymphadenopathy about the head, neck, axilla . Eyes: No conjunctival inflammation or lid edema is present. There is no scleral icterus. Ears:  External ear exam shows no significant lesions or deformities.   Nose:  External nasal examination shows no deformity or inflammation. Nasal mucosa are pink and moist without lesions ,exudates Oral exam: lips and gums are healthy appearing.There is no oropharyngeal erythema or exudate . Neck:  No thyromegaly, masses, tenderness noted.    Heart:  Normal rate and regular rhythm. S1 and S2 normal without gallop, murmur, click, rub .  Lungs:Chest clear to auscultation without wheezes, rhonchi,rales , rubs. Abdomen:Bowel sounds are normal. Abdomen is soft and nontender with no organomegaly, hernias,masses. GU: deferred as addressed today. Extremities:  No cyanosis, clubbing,edema  Neurologic exam : Strength equal  in upper & lower extremities but decreased Balance,Rhomberg,finger to nose testing could not be completed due to clinical state Deep tendon reflexes are  equal but 1/2+ Skin: Warm & dry w/o tenting. No significant lesions or rash.    See summary under each active problem in the Problem List with associated updated therapeutic plan

## 2015-11-03 NOTE — Assessment & Plan Note (Addendum)
11/03/15 initial culture revealed multiple species. Reculture collected. hematuria related to her renal calculi appears to be present rather than hematuria due to urinary tract infection or cystitis As per [facility]

## 2015-11-03 NOTE — Patient Instructions (Signed)
Meclizine trial in place of the erythromycin solution. To date Reglan has not been of benefit. Zofran will be considered if the meclizine is not beneficial.

## 2015-11-03 NOTE — Assessment & Plan Note (Addendum)
Cost and the off label nature of this medication discussed with the patient Trial of meclizine in place of the erythromycin

## 2015-11-03 NOTE — Assessment & Plan Note (Signed)
The few sitting and standing blood pressures documented in Matrix do not reveal significant postural blood pressure drop Continue postural blood pressure monitor on the Florinef as well as monitor of electrolytes

## 2015-11-06 ENCOUNTER — Encounter (HOSPITAL_COMMUNITY)
Admission: RE | Admit: 2015-11-06 | Discharge: 2015-11-06 | Disposition: A | Discharge status: Home / Self Care | Payer: Medicare Other | Source: Skilled Nursing Facility | Attending: *Deleted | Admitting: *Deleted

## 2015-11-06 LAB — BASIC METABOLIC PANEL
ANION GAP: 9 (ref 5–15)
BUN: 16 mg/dL (ref 6–20)
CHLORIDE: 103 mmol/L (ref 101–111)
CO2: 25 mmol/L (ref 22–32)
Calcium: 9.1 mg/dL (ref 8.9–10.3)
Creatinine, Ser: 0.57 mg/dL (ref 0.44–1.00)
GFR calc Af Amer: >60 mL/min (ref >60)
GLUCOSE: 222 mg/dL — AB (ref 65–99)
POTASSIUM: 4.2 mmol/L (ref 3.5–5.1)
Sodium: 137 mmol/L (ref 135–145)

## 2015-11-07 LAB — BASIC METABOLIC PANEL: GLUCOSE: 398 mg/dL

## 2015-11-13 ENCOUNTER — Encounter: Payer: Self-pay | Admitting: Diagnostic Neuroimaging

## 2015-11-15 LAB — BASIC METABOLIC PANEL: Glucose: 133 mg/dL

## 2015-11-17 ENCOUNTER — Non-Acute Institutional Stay (SKILLED_NURSING_FACILITY): Payer: Medicare Other | Admitting: Internal Medicine

## 2015-11-17 DIAGNOSIS — I951 Orthostatic hypotension: Secondary | ICD-10-CM

## 2015-11-17 DIAGNOSIS — R739 Hyperglycemia, unspecified: Secondary | ICD-10-CM | POA: Diagnosis not present

## 2015-11-17 LAB — BASIC METABOLIC PANEL
GLUCOSE: 196 mg/dL
Glucose: 258 mg/dL

## 2015-11-17 NOTE — Patient Instructions (Addendum)
New orders for Matrix entry.. CBC and differential  BMET  Hepatic profile Repeat the isometric exercises discussed 4- 5 times prior to standing if you've been seated for a period of time. Please wear support hose when out of bed  Change glucose monitor to fasting each morning Increase Levemir to 10 units daily

## 2015-11-17 NOTE — Assessment & Plan Note (Signed)
Monitor fasting glucoses and titrate Levemir as indicated Increase Levemir to 10 units

## 2015-11-17 NOTE — Progress Notes (Signed)
Patient ID: Jenna Maldonado, female   DOB: 04-02-1951, 65 y.o.   MRN: [id number]     [facility]: 159  Chief Complaint  Patient presents with  . Acute Visit    Hypotension   Allergies  Allergen Reactions  . Atorvastatin Other (See Comments)    intolerance  . Ciprofloxacin Itching  . Codeine Nausea And Vomiting  . Flagyl [Metronidazole] Itching  . Hydrocodone Nausea And Vomiting  . Pioglitazone Other (See Comments)    Weight gain  . Sulfa Antibiotics Other (See Comments)    intolerance    This is a nursing facility follow up for postural hypotension.   Interim medical record and care since last [place] visit was updated with review of diagnostic studies and change in clinical status since last visit were documented.  [id number] continuing Florinef she has continued to experience postural hypotension. This manifested as lightheadedness and dizziness when she stands up. It is accentuated if she is trying to complete task while standing. Today blood pressure was 110/54 sitting and 88/42 standing. She has not worn her compression hose for one week. She states that she does  isometric exercises prior to standing on an intermittent basis only  Most recent labs were 11/06/15. Chemistries and lytes were normal except for glucose of 222. Her A1c was 6% on 10/15/15. Random glucoses have ranged from a low of 133-398. Glucose elevation was expected on Florinef  She denies any cardiopulmonary prodrome prior to the dizziness. She's had no bleeding dyscrasias.  Review of systems: Denied were any change in heart rhythm or rate prior to the symptoms. There is no associated chest pain or shortness of breath . Also specifically denied prior to the episode are headache, limb weakness, tingling, or numbness. No seizure activity noted. Epistaxis, hemoptysis, hematuria, melena, or rectal bleeding denied. No unexplained weight loss, significant dyspepsia,dysphagia, or abdominal pain.    There is no abnormal bruising , bleeding, or difficulty stopping bleeding with injury.  Physical exam:  Pertinent or positive findings: Minimal resting exotropia OS. She is not wearing her upper and lower partials. S4 is noted. She has trace edema at the sock line. General appearance:Adequately nourished; no acute distress , increased work of breathing is present.   Lymphatic: No lymphadenopathy about the head, neck, axilla . Eyes: No conjunctival inflammation or lid edema is present. There is no scleral icterus. Ears:  External ear exam shows no significant lesions or deformities.   Nose:  External nasal examination shows no deformity or inflammation. Nasal mucosa are pink and moist without lesions ,exudates Oral exam: lips and gums are healthy appearing.There is no oropharyngeal erythema or exudate . Neck:  No thyromegaly, masses, tenderness noted.    Heart:  Normal rate and regular rhythm. S1 and S2 normal without gallop, murmur, click, rub .  Lungs:Chest clear to auscultation without wheezes, rhonchi,rales , rubs. Abdomen:Bowel sounds are normal. Abdomen is soft and nontender with no organomegaly, hernias,masses. GU: deferred as previously addressed. Extremities:  No cyanosis, clubbing,edema  Neurologic exam : Cn 2-7 intact Strength equal  in upper & lower extremities Balance,Rhomberg,finger to nose testing could not be completed due to clinical state Deep tendon reflexes are equal Skin: Warm & dry w/o tenting. No significant lesions or rash.    See summary under each active problem in the Problem List with associated updated therapeutic plan

## 2015-11-17 NOTE — Assessment & Plan Note (Signed)
Encouraged to do isometric exercises prior to standing Compression hose will be replaced Chemistries will be rechecked Referral to slightly referred to Dr.Shah , [place] Noss based on  clinical status post discharge

## 2015-11-18 ENCOUNTER — Encounter (HOSPITAL_COMMUNITY)
Admission: RE | Admit: 2015-11-18 | Discharge: 2015-11-18 | Disposition: A | Discharge status: Home / Self Care | Payer: Medicare Other | Source: Skilled Nursing Facility | Attending: Internal Medicine | Admitting: Internal Medicine

## 2015-11-18 DIAGNOSIS — I951 Orthostatic hypotension: Secondary | ICD-10-CM | POA: Insufficient documentation

## 2015-11-18 DIAGNOSIS — E1165 Type 2 diabetes mellitus with hyperglycemia: Secondary | ICD-10-CM | POA: Insufficient documentation

## 2015-11-18 DIAGNOSIS — E278 Other specified disorders of adrenal gland: Secondary | ICD-10-CM | POA: Insufficient documentation

## 2015-11-18 DIAGNOSIS — F329 Major depressive disorder, single episode, unspecified: Secondary | ICD-10-CM | POA: Insufficient documentation

## 2015-11-18 DIAGNOSIS — I452 Bifascicular block: Secondary | ICD-10-CM | POA: Insufficient documentation

## 2015-11-18 LAB — HEPATIC FUNCTION PANEL
ALT: 18 U/L (ref 14–54)
AST: 15 U/L (ref 15–41)
Albumin: 3.2 g/dL — ABNORMAL LOW (ref 3.5–5.0)
Alkaline Phosphatase: 103 U/L (ref 38–126)
BILIRUBIN INDIRECT: 0.3 mg/dL (ref 0.3–0.9)
Bilirubin, Direct: 0.1 mg/dL (ref 0.1–0.5)
TOTAL PROTEIN: 5.8 g/dL — AB (ref 6.5–8.1)
Total Bilirubin: 0.4 mg/dL (ref 0.3–1.2)

## 2015-11-18 LAB — CBC WITH DIFFERENTIAL/PLATELET
Basophils Absolute: 0 10*3/uL (ref 0.0–0.1)
Basophils Relative: 0 %
EOS ABS: 0.1 10*3/uL (ref 0.0–0.7)
EOS PCT: 2 %
HCT: 34.7 % — ABNORMAL LOW (ref 36.0–46.0)
Hemoglobin: 12 g/dL (ref 12.0–15.0)
LYMPHS ABS: 3 10*3/uL (ref 0.7–4.0)
Lymphocytes Relative: 57 %
MCH: 29.7 pg (ref 26.0–34.0)
MCHC: 34.6 g/dL (ref 30.0–36.0)
MCV: 85.9 fL (ref 78.0–100.0)
Monocytes Absolute: 0.3 10*3/uL (ref 0.1–1.0)
Monocytes Relative: 7 %
Neutro Abs: 1.8 10*3/uL (ref 1.7–7.7)
Neutrophils Relative %: 34 %
PLATELETS: 140 10*3/uL — AB (ref 150–400)
RBC: 4.04 MIL/uL (ref 3.87–5.11)
RDW: 12.7 % (ref 11.5–15.5)
WBC: 5.3 10*3/uL (ref 4.0–10.5)

## 2015-11-18 LAB — BASIC METABOLIC PANEL
Anion gap: 5 (ref 5–15)
BUN: 14 mg/dL (ref 6–20)
CALCIUM: 8.7 mg/dL — AB (ref 8.9–10.3)
CO2: 31 mmol/L (ref 22–32)
CREATININE: 0.58 mg/dL (ref 0.44–1.00)
Chloride: 101 mmol/L (ref 101–111)
GFR calc Af Amer: >60 mL/min (ref >60)
Glucose, Bld: 209 mg/dL — ABNORMAL HIGH (ref 65–99)
Potassium: 3.7 mmol/L (ref 3.5–5.1)
Sodium: 137 mmol/L (ref 135–145)

## 2015-11-19 ENCOUNTER — Encounter: Payer: Self-pay | Admitting: Internal Medicine

## 2015-11-19 ENCOUNTER — Non-Acute Institutional Stay (SKILLED_NURSING_FACILITY): Payer: Medicare Other | Admitting: Internal Medicine

## 2015-11-19 DIAGNOSIS — R739 Hyperglycemia, unspecified: Secondary | ICD-10-CM | POA: Diagnosis not present

## 2015-11-19 DIAGNOSIS — R112 Nausea with vomiting, unspecified: Secondary | ICD-10-CM | POA: Diagnosis not present

## 2015-11-19 DIAGNOSIS — I951 Orthostatic hypotension: Secondary | ICD-10-CM | POA: Diagnosis not present

## 2015-11-19 NOTE — Progress Notes (Signed)
Facility Location: [place] Room Number: 159-P  PCP: Monico Blitz, MD [address] [place] [address]    The patient is being discharged from [facility] on 11/20/15 date by Unice Cobble MD.  The medical history in this facility was reviewed and summarized and medical problem list was updated. Time spent and note content is documented as follows.  Summary of [facility] medical records: The patient was hospitalized 5/3-5/13/17 for severe orthostatic hypotension. Presentation was dizziness, near syncope, and emesis. She was diagnosed with autonomic dysfunction by Dr. Leonel Ramsay, Neurologist. He questioned possible parkinsonian plus syndrome but progressive super nuclear palsy multiple system atrophy needed to be ruled out. Referal to an autonomic dysfunction [facility] was recommended. Apparently there is such at [facility] in [place].  Florinef and Midodrine were titrated to control symptoms. The latter medication was to be titrated up to 7. 5-10 milligrams 3 times a day. Actually this agent was written as prn to be taken 3 times a day before standing up or sitting up in chair as per the discharge summary.  At [facility] she was not receiving this medication. It was restarted at 2.5 mg 3 times a day on 6/8. Commpression stockings were recommended.  Evaluation included normal fasting cortisol levels. 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 Hospital complications included  low potassium (K+ 2.7) and low magnesium. 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 Random glucoses were less than 120 in the hospital and her most recent A1c was prediabetic at 6% on 10/15/15. Reglan had not been of benefit. She was switched to Compazine and also liquid Erythromycin. According to the medical literature this is an off label indication for n&v. 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 At [facility] she received physical therapy and compression hose were continued. Isometric exercises prior to standing were encouraged. The liquid erythromycin was discontinued as its cost to the patient would be several thousand dollars a month. Nausea & vomiting was not a significant problem at [facility] ; but she did continue to exhibit significant orthostatic hypotension.    Comprehensive review of systems:Pertinent or active symptoms include: She does continue to have some orthostatic dizziness typically after standing and performing repetitive motion. She has weakness in the upper extremity related to  previous surgery. Again repetitive motion increases the weakness. She has chronic low back pain. Constipation has been controlled with daily laxative. She describes chronic depression despite high dose fluoxetine. Negative [id number]: No fever,significant weight change, fatigue  Eyes: No redness, discharge, pain, vision change ENT/mouth: No nasal congestion,  purulent discharge, earache,change in hearing ,sore throat  Cardiovascular: No chest pain, palpitations,paroxysmal nocturnal dyspnea, claudication, edema  Respiratory: No cough, sputum production,hemoptysis, DOE.No significant snoring,apnea despite not using CPAP  Gastrointestinal: No heartburn,dysphagia,abdominal pain, nausea / vomiting,rectal bleeding, melena,change in bowels Genitourinary: No dysuria,hematuria, pyuria,  incontinence, nocturia Musculoskeletal: No joint stiffness, joint swelling, pain Dermatologic: No rash, pruritus, change in appearance of skin Neurologic: No headache,syncope, seizures, numbness , tingling Psychiatric: No significant anxiety ,  insomnia, anorexia Endocrine: No change in hair/skin/ nails, excessive thirst, excessive hunger, excessive urination  Hematologic/lymphatic: No significant bruising, lymphadenopathy,abnormal bleeding Allergy/immunology: No itchy/ watery  eyes, significant sneezing, urticaria, angioedema  Physical exam:  Pertinent or positive findings:DTRs are 0-1/2+. There is slight weakness in the upper extremities, particularly on the left. She has trace pedal edema. Posterior tibial pulses are slightly decreased.  General appearance:Adequately nourished; no acute distress , increased work of breathing is present.   Lymphatic: No lymphadenopathy about the head, neck, axilla . Eyes: No conjunctival inflammation or lid edema is present. There is no scleral icterus. Ears:  External ear exam shows no significant lesions or deformities.   Nose:  External nasal examination shows no deformity or inflammation. Nasal mucosa are pink and moist without lesions ,exudates Oral exam: lips and gums are healthy appearing.There is no oropharyngeal erythema or exudate . Neck:  No thyromegaly, masses, tenderness noted.    Heart:  Normal rate and regular rhythm. S1 and S2 normal without gallop, murmur, click, rub .  Lungs:Chest clear to auscultation without wheezes, rhonchi,rales , rubs. Abdomen:Bowel sounds are normal. Abdomen is soft and nontender with no organomegaly, hernias,masses. GU: deferred as previously addressed. Extremities:  No cyanosis, clubbing  Neurologic exam : Strength equal  in upper & lower extremities Balance,Rhomberg,finger to nose testing could not be completed due to clinical state Deep tendon reflexes are equal Skin: Warm & dry w/o tenting. No significant lesions or rash.  See clinical summary of Discharge Diagnoses in the Problem List with associated updated therapeutic plan  Discharge instructions were written and discharge instructions provided. Follow-up will be by the primary care physician in seven - 10 days. Dr [name] will assess her ability to drive.

## 2015-11-19 NOTE — Assessment & Plan Note (Addendum)
11/19/15 she states that she is 80% better but continues have some orthostatic dizziness typically when standing and performing repetitive actions.  Continue Florinef and Midodrine 2.5 mg tid (Note: she was not receiving Midodrine at [facility] as discharge orders designated this  "tid prn pre standing or sitting up") ,  compression stockings when out of bed, and isometric exercises prior to standing  Dr [name]  her primary care physician will determine whether referal to the autonomic dysfunction center at [facility] is indicated clinically. Based on her significant improvement; reevaluation by her Neurologist in [place] could be considered prior to any [facility] referral.

## 2015-11-19 NOTE — Patient Instructions (Addendum)
Check fasting blood sugars daily ; your goal is less than 200, ideally less than 150. Report any hypoglycemia. Continue Levemir 10 units daily until you're seen by Dr.Shah. Take your glucose readings to that appointment with all your current medications. Repeat the isometric exercises discussed 4- 5 times prior to standing if you've been seated for a period of time. Wear compression hose when you're out of bed. Dr [name] may recommend referral to your Neurologist in [place] for updated assessment prior to considering referral to [facility]

## 2015-11-19 NOTE — Assessment & Plan Note (Signed)
Emesis not significant problem while at [facility]

## 2015-11-19 NOTE — Assessment & Plan Note (Addendum)
Levemir 10 units daily was prescribed for progressive hyperglycemia up to 398 She states that she was having hypoglycemia in the morning with glucoses as low as 50 on 35 units of Levemir prior to hospitalization She'll monitor fasting glucoses;  the goal is less than 200, ideally less than 158 .She is to report any hypoglycemia. Dr [name] will assess diabetic status; the hyperglycemia appears to be related to a Florinef based on the A1c of 6% 10/15/15

## 2015-12-11 ENCOUNTER — Ambulatory Visit: Payer: Self-pay | Admitting: Diagnostic Neuroimaging

## 2016-01-15 ENCOUNTER — Ambulatory Visit (INDEPENDENT_AMBULATORY_CARE_PROVIDER_SITE_OTHER): Payer: Medicare Other | Admitting: Diagnostic Neuroimaging

## 2016-01-15 ENCOUNTER — Encounter: Payer: Self-pay | Admitting: Diagnostic Neuroimaging

## 2016-01-15 VITALS — Ht 63.0 in | Wt 173.2 lb

## 2016-01-15 DIAGNOSIS — R261 Paralytic gait: Secondary | ICD-10-CM

## 2016-01-15 DIAGNOSIS — G501 Atypical facial pain: Secondary | ICD-10-CM | POA: Diagnosis not present

## 2016-01-15 DIAGNOSIS — M542 Cervicalgia: Secondary | ICD-10-CM

## 2016-01-15 DIAGNOSIS — G903 Multi-system degeneration of the autonomic nervous system: Secondary | ICD-10-CM | POA: Diagnosis not present

## 2016-01-15 DIAGNOSIS — G20 Parkinson's disease: Secondary | ICD-10-CM

## 2016-01-15 NOTE — Patient Instructions (Signed)
Thank you for coming to see [place] at [facility]. I hope we have been able to provide you high quality care today.  You may receive a patient satisfaction survey over the next few weeks. We would appreciate your feedback and comments so that we may continue to improve ourselves and the health of our patients.  - use walker  - continue florinef - stop midodrine   ~~~~~~~~~~~~~~~~~~~~~~~~~~~~~~~~~~~~~~~~~~~~~~~~~~~~~~~~~~~~~~~~~  DR. Akeylah Hendel'S GUIDE TO HAPPY AND HEALTHY LIVING These are some of my general health and wellness recommendations. Some of them may apply to you better than others. Please use common sense as you try these suggestions and feel free to ask me any questions.   ACTIVITY/FITNESS Mental, social, emotional and physical stimulation are very important for brain and body health. Try learning a new activity (arts, music, language, sports, games).  Keep moving your body to the best of your abilities. You can do this at home, inside or outside, the park, community center, gym or anywhere you like. Consider a physical therapist or personal trainer to get started. Consider the app Sworkit. Fitness trackers such as smart-watches, smart-phones or Fitbits can help as well.   NUTRITION Eat more plants: colorful vegetables, nuts, seeds and berries.  Eat less sugar, salt, preservatives and processed foods.  Avoid toxins such as cigarettes and alcohol.  Drink water when you are thirsty. Warm water with a slice of lemon is an excellent morning drink to start the day.  Consider these websites for more information The Nutrition Source (https://www.henry-hernandez.biz/) Precision Nutrition (WindowBlog.ch)   RELAXATION Consider practicing mindfulness meditation or other relaxation techniques such as deep breathing, prayer, yoga, tai chi, massage. See website mindful.org or the apps Headspace or Calm to help get  started.   SLEEP Try to get at least 7-8+ hours sleep per day. Regular exercise and reduced caffeine will help you sleep better. Practice good sleep hygeine techniques. See website sleep.org for more information.   PLANNING Prepare estate planning, living will, healthcare POA documents. Sometimes this is best planned with the help of an attorney. Theconversationproject.org and agingwithdignity.org are excellent resources.

## 2016-01-15 NOTE — Progress Notes (Signed)
PATIENT: Jenna Maldonado DOB: 12-16-1950  REASON FOR VISIT: follow up / hospital discharge HISTORY FROM: patient and daughter  Chief Complaint  Patient presents with  . Other    rm 7, New Pt, orthostatic hypotension, dgtr-Renee, "fell due to passing out, hypotension April '17, hospital x 4 wks, rehab x 3 weeks"     HISTORY OF PRESENT ILLNESS:  NEW HPI/UPDATE 01/15/16: 65 year old female here for evaluation of orthostatic hypotension. Patient was last seen in the office for right facial pain problems. In May 2017 patient developed new onset nausea, vomiting, dehydration, dizziness and lightheadedness. Patient presented to the emergency room was admitted to the hospital. She was found to have severe orthostatic hypotension. In spite of rehydration with IV fluids orthostatic hypotension continued. Neurology consult was obtained, and in the context of gradual onset gait difficulty, shuffling steps, tremor, possibility of Parkinson's or Parkinson's plus syndrome was raised. Patient was started on Florinef and Midrin. Patient never really took Midrin at home. She has been taking Florinef and supine systolic blood pressures ranged from 140-180. Standing blood pressures have ranged from 80 up to 100. Patient not having lightheadedness with standing up anymore.  Regarding Parkinson's symptoms, patient has had gradual onset and progressive gait deterioration, balance difficulty, poor handwriting, intermittent tremors. However since discharge patient has improved in terms of balance, energy and walking. Patient has a Rollator walker at home but is somewhat too proud to use it. Patient living with her daughter currently. Daughter is concerned about her ability to live alone and dry. Patient would like to move back to her own home and take care of herself.   UPDATE 09/25/14 (VRP): Since last visit, tried CBZ 200mg  BID, without benefit. Now on CBZ 100mg  BID. Has stopped gabapentin.  UPDATE 05/26/14 (VRP):  Since last visit, doing about the same. Reports daily, 7/10 level pain in the right face, pre-auricular, temporal, orbital, para-nasal region. Also with day time fatigue and balance diff. Taking CBZ 100/200 and gabapentin 600/600. Cannot take high gabapentin due to somnolence side effect. Also has sleep apnea (not using CPAP) and depression (on fluoxetine).   UPDATE 10/17/13 (LL): Since last visit, no change in facial pain with adding Carbamazepine.  Pain still present every day, especially in the morning.  She thinks there is swelling on the right side of her face in the temporal-cheek area. She feels pressure in her right ear, sometimes feels like fluid in the ear.  UPDATE 07/19/13 (LL): Since last visit, on gabapentin 300mg  BID, with moderate benefit, but Gabapentin makes her sleepy. She states she has more feeling in her feet. The facial pain is some better, but still bothersome. Still with right temporal, right maxillary pressure / achy pain, worse first thing in the morning.    UPDATE 11/6/149 (VP): Since last visit, on gabapentin 300mg  qhs, with mild benefit, and no side effects. Still with right temporal, right maxillary pressure / achy pain.   PRIOR HPI (01/02/13): 65 year old right-handed female with diabetes, depression, here for evaluation of right-sided facial pain. For past one year patient has right maxillary, periorbital burning pain, sometimes dull and nagging. Sometimes she has numbness in her whole head. Sometimes she has humming/buzzing sensation in her ears. She does to wake up in the morning with nausea and more pain which subsides throughout the day. She's not tried any medications for this. No over-the-counter medications. Patient had MRI of the trigeminal nerve protocol, which showed an incidental right parotid lymphatic structure but no definite trigeminal  nerve pathology.    REVIEW OF SYSTEMS: Full 14 system review of systems performed and negative except: dizziness decreased  activity chills eye discharge eye pain ringing in ears runny nose trouble swallowing leg swelling apnea joint pain back pain aching muscles walking difficulty neck pain confusion passing out weakness headache itching.    ALLERGIES: Allergies  Allergen Reactions  . Atorvastatin Other (See Comments)    intolerance  . Ciprofloxacin Itching  . Codeine Nausea And Vomiting  . Flagyl [Metronidazole] Itching  . Hydrocodone Nausea And Vomiting  . Pioglitazone Other (See Comments)    Weight gain  . Sulfa Antibiotics Other (See Comments)    intolerance    HOME MEDICATIONS: Outpatient Medications Prior to Visit  Medication Sig Dispense Refill  . bethanechol (URECHOLINE) 10 MG tablet Take 1 tablet (10 mg total) by mouth 3 (three) times daily. 30 tablet 0  . carbamazepine (TEGRETOL XR) 100 MG 12 hr tablet Take 300 mg by mouth 2 (two) times daily.     . fludrocortisone (FLORINEF) 0.1 MG tablet Take 2 tablets (0.2 mg total) by mouth daily. 30 tablet 0  . FLUoxetine (PROZAC) 40 MG capsule Take 40 mg by mouth daily.     . Insulin Detemir (LEVEMIR FLEXTOUCH) 100 UNIT/ML Pen Inject 10 Units into the skin daily at 10 pm.    . levothyroxine (SYNTHROID, LEVOTHROID) 50 MCG tablet Take 50 mcg by mouth daily before breakfast.    . omeprazole (PRILOSEC) 20 MG capsule Take 20 mg by mouth 2 (two) times daily before a meal.     . ondansetron (ZOFRAN) 4 MG tablet Take one tablet by mouth every 8 hours as needed for nausea    . polyethylene glycol (MIRALAX / GLYCOLAX) packet Take 17 g by mouth daily. 14 each 0  . pravastatin (PRAVACHOL) 20 MG tablet Take 1 tablet by mouth daily.    . prochlorperazine (COMPAZINE) 10 MG tablet Take 10 mg by mouth every 6 (six) hours as needed for nausea or vomiting.    . senna-docusate (SENOKOT-S) 8.6-50 MG tablet Take 2 tablets by mouth 2 (two) times daily. 30 tablet 0  . meclizine (ANTIVERT) 25 MG tablet Take one tablet by mouth twice daily    . midodrine (PROAMATINE) 2.5 MG  tablet Take 3 tablets (7.5 mg total) by mouth 3 (three) times daily as needed (Before standing up or sitting up in chair. Do not give when patient is going to be lying in bed.). (Patient not taking: Reported on 01/15/2016) 30 tablet 0   No facility-administered medications prior to visit.      PHYSICAL EXAM  GENERAL EXAM/CONSTITUTIONAL: Vitals:  Vitals:   01/15/16 1026  Weight: 173 lb 3.2 oz (78.6 kg)  Height: 5\' 3"  (1.6 m)    Orthostatic VS for the past 24 hrs (Last 3 readings):  BP- Lying Pulse- Lying BP- Sitting Pulse- Sitting BP- Standing at 0 minutes Pulse- Standing at 0 minutes  01/15/16 1032 156/81 68 - - - -  01/15/16 1027 - - 145/79 60 149/76 63      Patient is in no distress; well developed, nourished and groomed; neck is supple  MASKED FACIES  CARDIOVASCULAR:  Examination of carotid arteries is normal; no carotid bruits  Regular rate and rhythm, no murmurs  Examination of peripheral vascular system by observation and palpation is normal  EYES:  Ophthalmoscopic exam of optic discs and posterior segments is normal; no papilledema or hemorrhages  MUSCULOSKELETAL:  Gait, strength, tone, movements noted in Neurologic  exam below  NEUROLOGIC: MENTAL STATUS:   awake, alert, oriented to person, place and time  recent and remote memory intact  normal attention and concentration  language fluent, comprehension intact, naming intact,   fund of knowledge appropriate  CRANIAL NERVE:   2nd - no papilledema on fundoscopic exam  2nd, 3rd, 4th, 6th - pupils equal and reactive to light, visual fields full to confrontation, extraocular muscles intact, no nystagmus  5th - facial sensation symmetric  7th - facial strength symmetric  8th - hearing intact  9th - palate elevates symmetrically, uvula midline  11th - shoulder shrug symmetric  12th - tongue protrusion midline  MOTOR:   normal bulk  INCREASED TONE IN BLE > BUE; BRADYKINESIA IN BUE AND BLE  (LEFT SLOWER THAN RIGHT)  4+/5 strength in the BUE, BLE  RARE TREMOR IN CHIN/MOUTH  SENSORY:   normal and symmetric to light touch, temperature, vibration; DECR VIB AND TEMP IN FEET  COORDINATION:   finger-nose-finger, fine finger movements --> SLOW  REFLEXES:   deep tendon reflexes TRACE and symmetric  GAIT/STATION:   narrow based gait; SHORT STEPS; UNSTEADY; EN BLOC TURNING; ROMBERG NEGATIVE    DIAGNOSTICS/IMAGING    CBC Latest Ref Rng & Units 11/18/2015 10/26/2015 10/20/2015  WBC 4.0 - 10.5 K/uL 5.3 5.4 9.0  Hemoglobin 12.0 - 15.0 g/dL 12.0 13.5 14.0  Hematocrit 36.0 - 46.0 % 34.7(L) 38.6 39.5  Platelets 150 - 400 K/uL 140(L) 156 224   CMP Latest Ref Rng & Units 11/18/2015 11/06/2015 11/02/2015  Glucose 65 - 99 mg/dL 209(H) 222(H) 200(H)  BUN 6 - 20 mg/dL 14 16 12   Creatinine 0.44 - 1.00 mg/dL 0.58 0.57 0.53  Sodium 135 - 145 mmol/L 137 137 136  Potassium 3.5 - 5.1 mmol/L 3.7 4.2 3.9  Chloride 101 - 111 mmol/L 101 103 99(L)  CO2 22 - 32 mmol/L 31 25 31   Calcium 8.9 - 10.3 mg/dL 8.7(L) 9.1 8.7(L)  Total Protein 6.5 - 8.1 g/dL 5.8(L) - -  Total Bilirubin 0.3 - 1.2 mg/dL 0.4 - -  Alkaline Phos 38 - 126 U/L 103 - -  AST 15 - 41 U/L 15 - -  ALT 14 - 54 U/L 18 - -     11/15/12 MRI TRIGEMINAL - Basilar artery is midline without compression of the cisternal aspect of the fifth cranial nerve. Symmetric normal appearance of the course of the fifth cranial nerve without mass or abnormal enhancement identified. Along the inferior medial aspect of the right parotid gland, there is a nonspecific T2 hyperintense non-enhancing 1.6 x 1.1 x 1.4 cm lesion. Etiology of this findings indeterminate. 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  (Scan completed at [facility]).  10/29/13 MRI ORBITS/NECK - Unremarkable study with normal appearing anatomy of trigeminal nerve course and limited views of brain parenchyma. 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 No change from MRI on 11/15/12.  10/16/15 MRI brain [I reviewed images myself and agree with interpretation. -VRP]  - No acute intracranial abnormality noted.      ASSESSMENT AND PLAN  65 y.o. female with diabetes and depression previously here with atypical right facial pain, could be related to idiopathic trigeminal neuralgia, diabetes, migraine phenomenon. 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  Facial pain is stable on carbamazepine.   Now here for orthostatic hypotension and parkinsonism symptoms since May 2017.  Ddx: parkinson's plus syndrome, MSA-p, parkinson's disease, diabetic autonomic neuropathy, cervical myelopathy  Neurogenic orthostatic hypotension (HCC)  Atypical facial pain  Parkinsonism, unspecified Parkinsonism type ([place])  Spastic gait - Plan: MR Cervical Spine Wo Contrast  Neck pain - Plan: MR Cervical Spine Wo Contrast    PLAN: - check MRI cervical spine (rule out cervical myelopathy) - use rollator walker  - continue florinef for neurogenic orthostatic hypotension - stop midodrine (due to supine hypertension) - in future may consider northera (droxidopa) and carbidopa/levodopa  Orders Placed This Encounter  Procedures  . MR Cervical Spine Wo Contrast   Return in about 3 months (around 04/16/2016).     Penni Bombard, MD [id number], [id number] AM Certified in Neurology, Neurophysiology and Neuroimaging  [facility] [address], [place], [place] [address] [phone]

## 2016-02-05 ENCOUNTER — Ambulatory Visit (INDEPENDENT_AMBULATORY_CARE_PROVIDER_SITE_OTHER): Payer: Medicare Other | Admitting: Urology

## 2016-02-05 ENCOUNTER — Ambulatory Visit
Admission: RE | Admit: 2016-02-05 | Discharge: 2016-02-05 | Disposition: A | Discharge status: Home / Self Care | Payer: Medicare Other | Source: Ambulatory Visit | Attending: Diagnostic Neuroimaging | Admitting: Diagnostic Neuroimaging

## 2016-02-05 ENCOUNTER — Other Ambulatory Visit (HOSPITAL_COMMUNITY)
Admission: RE | Admit: 2016-02-05 | Discharge: 2016-02-05 | Disposition: A | Discharge status: Home / Self Care | Payer: Medicare Other | Source: Other Acute Inpatient Hospital | Attending: Urology | Admitting: Urology

## 2016-02-05 DIAGNOSIS — N3942 Incontinence without sensory awareness: Secondary | ICD-10-CM

## 2016-02-05 DIAGNOSIS — R338 Other retention of urine: Secondary | ICD-10-CM | POA: Diagnosis not present

## 2016-02-05 DIAGNOSIS — R261 Paralytic gait: Secondary | ICD-10-CM

## 2016-02-05 DIAGNOSIS — M542 Cervicalgia: Secondary | ICD-10-CM

## 2016-02-05 LAB — URINALYSIS, ROUTINE W REFLEX MICROSCOPIC
BILIRUBIN URINE: NEGATIVE
Glucose, UA: NEGATIVE mg/dL
Ketones, ur: NEGATIVE mg/dL
Nitrite: NEGATIVE
Protein, ur: NEGATIVE mg/dL
SPECIFIC GRAVITY, URINE: 1.01 (ref 1.005–1.030)
pH: 6 (ref 5.0–8.0)

## 2016-02-05 LAB — URINE MICROSCOPIC-ADD ON

## 2016-02-11 LAB — URINE CULTURE: Culture: >=100000 — AB

## 2016-02-16 ENCOUNTER — Telehealth: Payer: Self-pay | Admitting: *Deleted

## 2016-02-16 NOTE — Telephone Encounter (Signed)
Per Dr Leta Baptist, spoke with patient and informed her that her MRI of cervical spine results are unremarkable. She does have some mild degenerative changes, and Dr Leta Baptist will discuss further at her FU in Nov 2017. She verbalized understanding, appreciation.

## 2016-04-20 ENCOUNTER — Ambulatory Visit: Payer: Medicare Other | Admitting: Diagnostic Neuroimaging

## 2016-05-04 ENCOUNTER — Ambulatory Visit: Payer: Medicare Other | Admitting: Diagnostic Neuroimaging

## 2016-05-09 ENCOUNTER — Encounter: Payer: Self-pay | Admitting: Diagnostic Neuroimaging

## 2016-05-09 ENCOUNTER — Ambulatory Visit (INDEPENDENT_AMBULATORY_CARE_PROVIDER_SITE_OTHER): Payer: Medicare Other | Admitting: Diagnostic Neuroimaging

## 2016-05-09 VITALS — Wt 170.8 lb

## 2016-05-09 DIAGNOSIS — G4733 Obstructive sleep apnea (adult) (pediatric): Secondary | ICD-10-CM

## 2016-05-09 DIAGNOSIS — G903 Multi-system degeneration of the autonomic nervous system: Secondary | ICD-10-CM | POA: Diagnosis not present

## 2016-05-09 DIAGNOSIS — G20 Parkinson's disease: Secondary | ICD-10-CM

## 2016-05-09 DIAGNOSIS — G501 Atypical facial pain: Secondary | ICD-10-CM | POA: Diagnosis not present

## 2016-05-09 DIAGNOSIS — R261 Paralytic gait: Secondary | ICD-10-CM

## 2016-05-09 NOTE — Progress Notes (Signed)
PATIENT: Jenna Maldonado DOB: February 28, 1951  REASON FOR VISIT: follow up HISTORY FROM: patient and daughter  Chief Complaint  Patient presents with  . Neurogenic orthostatic hypotension    rm 6, dgtr- Renee, "vomited x 2 today-ongoing issue x 1 year; gall bladder removed"  . Follow-up    3 month     HISTORY OF PRESENT ILLNESS:  UPDATE 05/09/16: Since last visit, sxs stable. Now using rollator walker and more stable. Now with increasing HA (almost daily; neck and temple pain; some nausea and some photophobia and some visual aura). Also has sleep apnea, but not using CPAP; last study was ~ 10 years ago for snoring and excessive daytime fatigue.  NEW HPI/UPDATE 01/15/16: 65 year old female here for evaluation of orthostatic hypotension. Patient was last seen in the office for right facial pain problems. In May 2017 patient developed new onset nausea, vomiting, dehydration, dizziness and lightheadedness. Patient presented to the emergency room was admitted to the hospital. She was found to have severe orthostatic hypotension. In spite of rehydration with IV fluids orthostatic hypotension continued. Neurology consult was obtained, and in the context of gradual onset gait difficulty, shuffling steps, tremor, possibility of Parkinson's or Parkinson's plus syndrome was raised. Patient was started on Florinef and Midrin. Patient never really took Midrin at home. She has been taking Florinef and supine systolic blood pressures ranged from 140-180. Standing blood pressures have ranged from 80 up to 100. Patient not having lightheadedness with standing up anymore.  Regarding Parkinson's symptoms, patient has had gradual onset and progressive gait deterioration, balance difficulty, poor handwriting, intermittent tremors. However since discharge patient has improved in terms of balance, energy and walking. Patient has a Rollator walker at home but is somewhat too proud to use it. Patient living with her daughter  currently. Daughter is concerned about her ability to live alone and drive. Patient would like to move back to her own home and take care of herself.  UPDATE 09/25/14 (VRP): Since last visit, tried CBZ 200mg  BID, without benefit. Now on CBZ 100mg  BID. Has stopped gabapentin.  UPDATE 05/26/14 (VRP): Since last visit, doing about the same. Reports daily, 7/10 level pain in the right face, pre-auricular, temporal, orbital, para-nasal region. Also with day time fatigue and balance diff. Taking CBZ 100/200 and gabapentin 600/600. Cannot take high gabapentin due to somnolence side effect. Also has sleep apnea (not using CPAP) and depression (on fluoxetine).   UPDATE 10/17/13 (LL): Since last visit, no change in facial pain with adding Carbamazepine.  Pain still present every day, especially in the morning.  She thinks there is swelling on the right side of her face in the temporal-cheek area. She feels pressure in her right ear, sometimes feels like fluid in the ear.  UPDATE 07/19/13 (LL): Since last visit, on gabapentin 300mg  BID, with moderate benefit, but Gabapentin makes her sleepy. She states she has more feeling in her feet. The facial pain is some better, but still bothersome. Still with right temporal, right maxillary pressure / achy pain, worse first thing in the morning.    UPDATE 11/6/149 (VP): Since last visit, on gabapentin 300mg  qhs, with mild benefit, and no side effects. Still with right temporal, right maxillary pressure / achy pain.   PRIOR HPI (01/02/13): 65 year old right-handed female with diabetes, depression, here for evaluation of right-sided facial pain. For past one year patient has right maxillary, periorbital burning pain, sometimes dull and nagging. Sometimes she has numbness in her whole head. Sometimes she has  humming/buzzing sensation in her ears. She does to wake up in the morning with nausea and more pain which subsides throughout the day. She's not tried any medications for this.  No over-the-counter medications. Patient had MRI of the trigeminal nerve protocol, which showed an incidental right parotid lymphatic structure but no definite trigeminal nerve pathology.    REVIEW OF SYSTEMS: Full 14 system review of systems performed and negative except: dizziness decreased activity chills eye discharge eye pain ringing in ears runny nose trouble swallowing leg swelling apnea joint pain back pain aching muscles walking difficulty neck pain confusion passing out weakness headache itching.    ALLERGIES: Allergies  Allergen Reactions  . Atorvastatin Other (See Comments)    intolerance  . Ciprofloxacin Itching  . Codeine Nausea And Vomiting  . Flagyl [Metronidazole] Itching  . Hydrocodone Nausea And Vomiting  . Pioglitazone Other (See Comments)    Weight gain  . Sulfa Antibiotics Other (See Comments)    intolerance    HOME MEDICATIONS: Outpatient Medications Prior to Visit  Medication Sig Dispense Refill  . carbamazepine (TEGRETOL XR) 100 MG 12 hr tablet Take 300 mg by mouth 2 (two) times daily.     . fludrocortisone (FLORINEF) 0.1 MG tablet Take 2 tablets (0.2 mg total) by mouth daily. 30 tablet 0  . FLUoxetine (PROZAC) 40 MG capsule Take 40 mg by mouth daily.     . Insulin Detemir (LEVEMIR FLEXTOUCH) 100 UNIT/ML Pen Inject 10 Units into the skin daily at 10 pm.    . levothyroxine (SYNTHROID, LEVOTHROID) 50 MCG tablet Take 50 mcg by mouth daily before breakfast.    . omeprazole (PRILOSEC) 20 MG capsule Take 20 mg by mouth 2 (two) times daily before a meal.     . ondansetron (ZOFRAN) 4 MG tablet Take one tablet by mouth every 8 hours as needed for nausea    . polyethylene glycol (MIRALAX / GLYCOLAX) packet Take 17 g by mouth daily. 14 each 0  . pravastatin (PRAVACHOL) 20 MG tablet Take 1 tablet by mouth daily.    . prochlorperazine (COMPAZINE) 10 MG tablet Take 10 mg by mouth every 6 (six) hours as needed for nausea or vomiting.    . senna (SENOKOT) 8.6 MG tablet As  needed  3  . senna-docusate (SENOKOT-S) 8.6-50 MG tablet Take 2 tablets by mouth 2 (two) times daily. 30 tablet 0  . bethanechol (URECHOLINE) 10 MG tablet Take 1 tablet (10 mg total) by mouth 3 (three) times daily. (Patient not taking: Reported on 05/09/2016) 30 tablet 0   No facility-administered medications prior to visit.      PHYSICAL EXAM  GENERAL EXAM/CONSTITUTIONAL: Vitals:  Vitals:   05/09/16 0913  Weight: 170 lb 12.8 oz (77.5 kg)   Orthostatic VS for the past 24 hrs (Last 3 readings):  BP- Lying Pulse- Lying BP- Sitting Pulse- Sitting BP- Standing at 0 minutes Pulse- Standing at 0 minutes  05/09/16 0919 (!) 188/92 72 165/87 74 146/81 74     Patient is in no distress; well developed, nourished and groomed; neck is supple  MASKED FACIES  CARDIOVASCULAR:  Examination of carotid arteries is normal; no carotid bruits  Regular rate and rhythm, no murmurs  Examination of peripheral vascular system by observation and palpation is normal  EYES:  Ophthalmoscopic exam of optic discs and posterior segments is normal; no papilledema or hemorrhages  MUSCULOSKELETAL:  Gait, strength, tone, movements noted in Neurologic exam below  NEUROLOGIC: MENTAL STATUS:   awake, alert, oriented to  person, place and time  recent and remote memory intact  normal attention and concentration  language fluent, comprehension intact, naming intact,   fund of knowledge appropriate  CRANIAL NERVE:   2nd - no papilledema on fundoscopic exam  2nd, 3rd, 4th, 6th - pupils equal and reactive to light, visual fields full to confrontation, extraocular muscles intact, no nystagmus  5th - facial sensation symmetric  7th - facial strength symmetric  8th - hearing intact  9th - palate elevates symmetrically, uvula midline  11th - shoulder shrug symmetric  12th - tongue protrusion midline  MOTOR:   normal bulk  INCREASED TONE IN BLE > BUE; BRADYKINESIA IN BUE AND BLE (RIGHT  SLOWER THAN LEFT)  4+/5 strength in the BUE, BLE  RARE TREMOR IN CHIN/MOUTH  SENSORY:   normal and symmetric to light touch, temperature, vibration; DECR VIB AND TEMP IN FEET  COORDINATION:   finger-nose-finger, fine finger movements --> SLOW  REFLEXES:   deep tendon reflexes TRACE and symmetric  GAIT/STATION:   narrow based gait; USES ROLLATOR; SHORT STEPS    DIAGNOSTICS/IMAGING    CBC Latest Ref Rng & Units 11/18/2015 10/26/2015 10/20/2015  WBC 4.0 - 10.5 K/uL 5.3 5.4 9.0  Hemoglobin 12.0 - 15.0 g/dL 12.0 13.5 14.0  Hematocrit 36.0 - 46.0 % 34.7(L) 38.6 39.5  Platelets 150 - 400 K/uL 140(L) 156 224   CMP Latest Ref Rng & Units 11/18/2015 11/06/2015 11/02/2015  Glucose 65 - 99 mg/dL 209(H) 222(H) 200(H)  BUN 6 - 20 mg/dL 14 16 12   Creatinine 0.44 - 1.00 mg/dL 0.58 0.57 0.53  Sodium 135 - 145 mmol/L 137 137 136  Potassium 3.5 - 5.1 mmol/L 3.7 4.2 3.9  Chloride 101 - 111 mmol/L 101 103 99(L)  CO2 22 - 32 mmol/L 31 25 31   Calcium 8.9 - 10.3 mg/dL 8.7(L) 9.1 8.7(L)  Total Protein 6.5 - 8.1 g/dL 5.8(L) - -  Total Bilirubin 0.3 - 1.2 mg/dL 0.4 - -  Alkaline Phos 38 - 126 U/L 103 - -  AST 15 - 41 U/L 15 - -  ALT 14 - 54 U/L 18 - -   Lab Results  Component Value Date   HGBA1C 6.0 (H) 10/15/2015   11/15/12 MRI TRIGEMINAL - Basilar artery is midline without compression of the cisternal aspect of the fifth cranial nerve. Symmetric normal appearance of the course of the fifth cranial nerve without mass or abnormal enhancement identified. Along the inferior medial aspect of the right parotid gland, there is a nonspecific T2 hyperintense non-enhancing 1.6 x 1.1 x 1.4 cm lesion. Etiology of this findings indeterminate. 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  (Scan completed at [facility]).  10/29/13 MRI ORBITS/NECK - Unremarkable study with normal appearing anatomy of trigeminal nerve course and limited views of brain  parenchyma. 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 No change from MRI on 11/15/12.  10/16/15 MRI brain [I reviewed images myself and agree with interpretation. -VRP]  - No acute intracranial abnormality noted.      ASSESSMENT AND PLAN  65 y.o. female with diabetes and depression previously here with atypical right facial pain, could be related to idiopathic trigeminal neuralgia, diabetes, migraine phenomenon. 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  Facial pain is stable on carbamazepine.   Now here for orthostatic hypotension and parkinsonism symptoms since May 2017.   Ddx: parkinson's  plus syndrome, MSA-p, parkinson's disease, diabetic autonomic neuropathy  Neurogenic orthostatic hypotension (HCC)  Atypical facial pain  Parkinsonism, unspecified Parkinsonism type (HCC)  Spastic gait  OSA (obstructive sleep apnea) - Plan: Ambulatory referral to Sleep Studies    PLAN: - continue rollator walker  - continue florinef for neurogenic orthostatic hypotension - stay off midodrine (due to supine hypertension) - check sleep consult / study for OSA (no treatment in last 10 years) - in future may consider carbidopa / levodopa; patient reluctant at this point due to polypharmacy - in future may consider gabapentin or topiramate (caution with left kidney stone) for migraine prevention  Orders Placed This Encounter  Procedures  . Ambulatory referral to Sleep Studies   Return in about 4 months (around 09/06/2016).     Penni Bombard, MD [id number], 0000000 AM Certified in Neurology, Neurophysiology and Neuroimaging  [facility] [address] [place], [place] [address] [phone]

## 2016-06-08 ENCOUNTER — Ambulatory Visit (INDEPENDENT_AMBULATORY_CARE_PROVIDER_SITE_OTHER): Payer: Medicare Other | Admitting: Neurology

## 2016-06-08 ENCOUNTER — Encounter: Payer: Self-pay | Admitting: Neurology

## 2016-06-08 VITALS — BP 153/67 | HR 65 | Resp 20 | Ht 63.0 in | Wt 174.0 lb

## 2016-06-08 DIAGNOSIS — G2581 Restless legs syndrome: Secondary | ICD-10-CM | POA: Diagnosis not present

## 2016-06-08 DIAGNOSIS — E669 Obesity, unspecified: Secondary | ICD-10-CM | POA: Diagnosis not present

## 2016-06-08 DIAGNOSIS — R351 Nocturia: Secondary | ICD-10-CM

## 2016-06-08 DIAGNOSIS — G4733 Obstructive sleep apnea (adult) (pediatric): Secondary | ICD-10-CM

## 2016-06-08 DIAGNOSIS — R519 Headache, unspecified: Secondary | ICD-10-CM

## 2016-06-08 DIAGNOSIS — G903 Multi-system degeneration of the autonomic nervous system: Secondary | ICD-10-CM

## 2016-06-08 DIAGNOSIS — R51 Headache: Secondary | ICD-10-CM | POA: Diagnosis not present

## 2016-06-08 NOTE — Patient Instructions (Addendum)

## 2016-06-08 NOTE — Progress Notes (Signed)
Subjective:    Patient ID: Jenna Maldonado is a 65 y.o. female.  HPI     Star Age, MD, PhD [facility] [address], [address] [place], [place] [address]  Dear Bonnita Levan,   I saw your patient, Jenna Maldonado, upon your kind request in my clinic today for initial consultation of her sleep disorder, in particular, concern for underlying obstructive sleep apnea. The patient is accompanied by her daughter and granddaughter today. As you know, Jenna Maldonado is a 65 year old right-handed woman with an underlying medical history of orthostatic hypotension, depression, recurrent headaches and atypical facial pain, chronic constipation, diabetes, neuropathy, reflux disease, hypothyroidism, kidney stone, and obesity, who reports snoring and excessive daytime somnolence. I reviewed your office note from 05/09/2016. Her Epworth sleepiness score is 9 out of 24 today, her fatigue score is 24 out of 63. She reports having had a home sleep test in the past with results not available for my review today. She was told she had OSA and was placed on either CPAP or autoPAP, machine is about 64 or 64 years old, has not used in over 3 years. She lives with her daughter. She is a nonsmoker, does not drink alcohol or take illicit drugs, she drinks caffeine once a day. 5 lb poodle in bed with her. No TV in the bedroom, mild or occasional RLS symptoms but unclear if PLMs.  Bedtime is 9 to 9:30, wake time is around 7:30 AM, but out of bed by 9:30.  She has had some weight fluctuation. She has recurrent headaches. Nocturia is once per night. She does have a FHx of OSA in sister and another sister's son (nephew).  She has 1 biological daughter whom she lives with, 1 biological son and another daughter who is adopted.  Her Past Medical History Is Significant For: Past Medical History:  Diagnosis Date  . 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 Calculus of kidney    L inferior kidney ; non  obstructing  . Chronic constipation   . Chronic headaches   . Depression   . Diabetic autonomic neuropathy ([place])   . Diabetic neuropathy ([place])   . Esophageal reflux   . Hypothyroidism, adult   . Orthostatic hypotension   . Residual foreign body in soft tissue   . 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 Shortness of breath     Her Past Surgical History Is Significant For: Past Surgical History:  Procedure Laterality Date  . carpel tunnel    . CHOLECYSTECTOMY  11/2014   June 2016  . Eye Surgrey Bilateral    4-5 times for macular degeneration  . HAND SURGERY Bilateral 2001  . SHOULDER SURGERY Left   . TUBAL LIGATION      Her Family History Is Significant For: Family History  Problem Relation Age of Onset  . Cancer Mother   . Diabetes Mother   . Suicidality Father     GSW  . Diabetes Sister     3 sisters  . Heart disease Brother     MI  . Diabetes Brother     2 brothers  . Stroke Neg Hx     Her Social History Is Significant For: Social History   Social History  . Marital status: Married    Spouse name: Laverna Peace  . Number of children: 2  . Years of education: 12   Occupational History  . N/A    Social History  Main Topics  . Smoking status: Never Smoker  . Smokeless tobacco: Never Used  . Alcohol use No  . Drug use: No  . Sexual activity: Not Asked   Other Topics Concern  . None   Social History Narrative   01/15/16 Patient lives with daughter, Joseph Art   Caffeine Use: occasionally    Her Allergies Are:  Allergies  Allergen Reactions  . Atorvastatin Other (See Comments)    intolerance  . Ciprofloxacin Itching  . Codeine Nausea And Vomiting  . Flagyl [Metronidazole] Itching  . Hydrocodone Nausea And Vomiting  . Pioglitazone Other (See Comments)    Weight gain  . Sulfa Antibiotics Other (See Comments)    intolerance  :   Her Current Medications Are:  Outpatient Encounter Prescriptions as of 06/08/2016  Medication Sig  .  bethanechol (URECHOLINE) 10 MG tablet Take 1 tablet (10 mg total) by mouth 3 (three) times daily.  . carbamazepine (TEGRETOL XR) 100 MG 12 hr tablet Take 300 mg by mouth 2 (two) times daily.   . fludrocortisone (FLORINEF) 0.1 MG tablet Take 2 tablets (0.2 mg total) by mouth daily.  [name] FLUoxetine (PROZAC) 40 MG capsule Take 40 mg by mouth daily.   . Insulin Detemir (LEVEMIR FLEXTOUCH) 100 UNIT/ML Pen Inject 10 Units into the skin daily at 10 pm.  . levothyroxine (SYNTHROID, LEVOTHROID) 50 MCG tablet Take 50 mcg by mouth daily before breakfast.  . omeprazole (PRILOSEC) 20 MG capsule Take 20 mg by mouth 2 (two) times daily before a meal.   . ondansetron (ZOFRAN) 4 MG tablet Take one tablet by mouth every 8 hours as needed for nausea  . polyethylene glycol (MIRALAX / GLYCOLAX) packet Take 17 g by mouth daily.  . pravastatin (PRAVACHOL) 20 MG tablet Take 1 tablet by mouth daily.  . prochlorperazine (COMPAZINE) 10 MG tablet Take 10 mg by mouth every 6 (six) hours as needed for nausea or vomiting.  . senna (SENOKOT) 8.6 MG tablet As needed   No facility-administered encounter medications on file as of 06/08/2016.   :  Review of Systems:  Out of a complete 14 point review of systems, all are reviewed and negative with the exception of these symptoms as listed below: Review of Systems  HENT: Positive for hearing loss and trouble swallowing.   Eyes: Positive for pain.  Respiratory: Positive for cough.   Musculoskeletal: Positive for arthralgias.  Neurological: Positive for tremors, speech difficulty, weakness and headaches.       Pt presents today to discuss obtaining a sleep study. Pt does not think that she has had formal sleep testing. She believes that she had a 3 day home sleep test at one time. Pt does endorse snoring.  Epworth Sleepiness Scale 0= would never doze 1= slight chance of dozing 2= moderate chance of dozing 3= high chance of dozing  Sitting and reading: 2 Watching TV:  3 Sitting inactive in a public place (ex. Theater or meeting): 0 As a passenger in a car for an hour without a break: 1 Lying down to rest in the afternoon: 1 Sitting and talking to someone: 0 Sitting quietly after lunch (no alcohol): 2 In a car, while stopped in traffic: 0 Total: 9   Psychiatric/Behavioral: Positive for sleep disturbance.    Objective:  Neurologic Exam  Physical Exam Physical Examination:   Vitals:   06/08/16 1510  BP: (!) 153/67  Pulse: 65  Resp: 20   General Examination: The patient is a very pleasant 65 y.o.  female in no acute distress. She appears well-developed and well-nourished and well groomed.   HEENT: Normocephalic, atraumatic, pupils are equal, round and reactive to light and accommodation. Funduscopic exam is normal with sharp disc margins noted. Extraocular tracking is good without limitation to gaze excursion or nystagmus noted. Normal smooth pursuit is noted. Hearing is grossly intact. Tympanic membranes are clear bilaterally. Face is symmetric with normal facial animation and normal facial sensation. Speech is clear with no dysarthria noted. There is no hypophonia. There is no lip, neck/head, jaw or voice tremor. Neck is supple with full range of passive and active motion. There are no carotid bruits on auscultation. Oropharynx exam reveals: mild mouth dryness, adequate dental hygiene and partials in place, very few teeth left, moderate airway crowding, due to fairly small airway entry, redundant soft palate, tonsils in place but tonsils are small in size and so is uvula. Mallampati is class II. Tongue protrudes centrally and palate elevates symmetrically. Neck size is 16 inches. She has a Mild overbite.   Chest: Clear to auscultation without wheezing, rhonchi or crackles noted.  Heart: S1+S2+0, regular and normal without murmurs, rubs or gallops noted.   Abdomen: Soft, non-tender and non-distended with normal bowel sounds appreciated on  auscultation.  Extremities: There is no pitting edema in the distal lower extremities bilaterally. Pedal pulses are intact.  Skin: Warm and dry without trophic changes noted.  Musculoskeletal: exam reveals no obvious joint deformities, tenderness or joint swelling or erythema.   Neurologically:  Mental status: The patient is awake, alert and oriented in all 4 spheres. Her immediate and remote memory, attention, language skills and fund of knowledge are appropriate. There is no evidence of aphasia, agnosia, apraxia or anomia. Speech is clear with normal prosody and enunciation. Thought process is linear. Mood is normal and affect is normal.  Cranial nerves II - XII are as described above under HEENT exam. In addition: shoulder shrug is normal with equal shoulder height noted. Motor exam: Normal bulk, strength and tone is noted. There is no drift, tremor or rebound. Romberg is not tested as she is not safe to stand narrow based. Reflexes are 2+ throughout. Fine motor skills and coordination: intact with normal finger taps, normal hand movements, normal rapid alternating patting, normal foot taps and normal foot agility.  Cerebellar testing: No dysmetria or intention tremor on finger to nose testing. Heel to shin is unremarkable bilaterally. There is no truncal or gait ataxia.  Sensory exam: Decreased in the distal lower extremities.  Gait, station and balance: She stands with mild difficulty, has a mildly stooped posture and walks cautiously, mildly decreased arm swing bilaterally.   Assessment and Plan:   In summary, Jenna Maldonado is a very pleasant 65 y.o.-year old female with an underlying medical history of orthostatic hypotension, depression, recurrent headaches and atypical facial pain, chronic constipation, diabetes, neuropathy, reflux disease, hypothyroidism, kidney stone, and obesity, who was previously diagnosed with obstructive sleep apnea and placed on either CPAP or AutoPap machine but  has not been on treatment for at least 3 years, machine is also over 60 years old. Patient would benefit from reevaluation and treatment. In addition, patient has a history of mild intermittent restless leg symptoms and we will be on the lookout for PLMS during the sleep study. I had a long chat with the patient and Her daughter Joseph Art about my findings and the diagnosis of OSA, its prognosis and treatment options. We talked about medical treatments, surgical interventions and non-pharmacological  approaches. I explained in particular the risks and ramifications of untreated moderate to severe OSA, especially with respect to developing cardiovascular disease down the Road, including congestive heart failure, difficult to treat hypertension, cardiac arrhythmias, or stroke. Even type 2 diabetes has, in part, been linked to untreated OSA. Symptoms of untreated OSA include daytime sleepiness, memory problems, mood irritability and mood disorder such as depression and anxiety, lack of energy, as well as recurrent headaches, especially morning headaches. We talked about trying to maintain a healthy lifestyle in general, as well as the importance of weight control. I encouraged the patient to eat healthy, exercise daily and keep well hydrated, to keep a scheduled bedtime and wake time routine, to not skip any meals and eat healthy snacks in between meals. I advised the patient not to drive when feeling sleepy. I recommended the following at this time: sleep study with potential positive airway pressure titration. (We will score hypopneas at 4% and split the sleep study into diagnostic and treatment portion, if the estimated. 2 hour AHI is >20/h).   I explained the sleep test procedure to the patient and also outlined possible surgical and non-surgical treatment options of OSA, including the use of a custom-made dental device (which would require a referral to a specialist dentist or oral surgeon), upper airway surgical  options, such as pillar implants, radiofrequency surgery, tongue base surgery, and UPPP (which would involve a referral to an ENT surgeon). Rarely, jaw surgery such as mandibular advancement may be considered.  I also explained the CPAP treatment option to the patient, who indicated that she would be willing to try CPAP if the need arises. I explained the importance of being compliant with PAP treatment, not only for insurance purposes but primarily to improve Her symptoms, and for the patient's long term health benefit, including to reduce Her cardiovascular risks. I answered all their questions today and the patient and her daughter  were in agreement. I would like to see her back after the sleep study is completed and encouraged her to call with any interim questions, concerns, problems or updates.   Thank you very much for allowing me to participate in the care of this nice patient. If I can be of any further assistance to you please do not hesitate to talk to me.  Sincerely,   Star Age, MD, PhD

## 2016-06-27 ENCOUNTER — Ambulatory Visit (INDEPENDENT_AMBULATORY_CARE_PROVIDER_SITE_OTHER): Payer: Medicare Other | Admitting: Neurology

## 2016-06-27 DIAGNOSIS — G4733 Obstructive sleep apnea (adult) (pediatric): Secondary | ICD-10-CM | POA: Diagnosis not present

## 2016-06-27 DIAGNOSIS — G479 Sleep disorder, unspecified: Secondary | ICD-10-CM

## 2016-06-27 DIAGNOSIS — G4719 Other hypersomnia: Secondary | ICD-10-CM

## 2016-06-27 DIAGNOSIS — G4720 Circadian rhythm sleep disorder, unspecified type: Secondary | ICD-10-CM

## 2016-07-01 NOTE — Progress Notes (Signed)
Patient referred by Dr. Leta Baptist, seen by me on 06/08/16, diagnostic PSG on 06/27/16.   Please call and notify the patient that the recent sleep study did not show any significant obstructive sleep apnea or significant snoring or significant leg twitching in sleep. She had mostly light stage sleep, nearly no dream sleep, no deep sleep, no sleep achieved on her back. She can follow up with Dr. Leta Baptist as planned. Also, route or fax report to PCP and referring MD, if other than PCP.  Once you have spoken to patient, you can close this encounter.   Thanks,  Star Age, MD, PhD [facility])

## 2016-07-01 NOTE — Procedures (Signed)
PATIENT'S NAME:  Jenna Maldonado, Jenna Maldonado DOB:      [birth year]      MR#:    [id number]     DATE OF RECORDING: 06/27/2016 REFERRING M.D.:  Monico Blitz, MD Study Performed:   Baseline Polysomnogram HISTORY: 66 year old woman with a history of orthostatic hypotension, depression, recurrent headaches and atypical facial pain, chronic constipation, diabetes, neuropathy, reflux disease, hypothyroidism, kidney stone, and obesity, who reports snoring and excessive daytime somnolence. The patient endorsed the Epworth Sleepiness Scale at 9/24 points. The patient's weight 174 pounds with a height of 63 (inches), resulting in a BMI of 30.9 kg/m2. The patient's neck circumference measured 16 inches.  CURRENT MEDICATIONS: Bethanechol, Carbamazepine, Fludrocortisone, Fluoxetine, Levothyroxine, Omeprazole, Ondansetron, Polyethylene glycol, Pravastatin, Prochlorperazine and Senna   PROCEDURE:  This is a multichannel digital polysomnogram utilizing the Somnostar 11.2 system.  Electrodes and sensors were applied and monitored per AASM Specifications.   EEG, EOG, Chin and Limb EMG, were sampled at 200 Hz.  ECG, Snore and Nasal Pressure, Thermal Airflow, Respiratory Effort, CPAP Flow and Pressure, Oximetry was sampled at 50 Hz. Digital video and audio were recorded.      BASELINE STUDY  Lights Out was at 21:16 and Lights On at 04:59.  Total recording time (TRT) was 358 minutes, with a total sleep time (TST) of  291.5 minutes.   The patient's sleep latency was 44 minutes, which is prolonged.  REM latency was 207 minutes, which is prolonged.  The sleep efficiency was 81.4 %.     SLEEP ARCHITECTURE: WASO (Wake after sleep onset) was 32 minutes with moderate sleep fragmentation noted.  There were 19.5 minutes in Stage N1, 268 minutes Stage N2, 0 minutes Stage N3 and 4 minutes in Stage REM.  The percentage of Stage N1 was 6.7%, Stage N2 was 91.9%, which is markedly increased, Stage N3 was absent and Stage R (REM sleep) was near absent at  1.4%.   The arousals were noted as: 45 were spontaneous, 1 were associated with PLMs, 5 were associated with respiratory events.  Audio and video analysis did not show any abnormal or unusual movements, behaviors, phonations or vocalizations.  The patient took no bathroom breaks. No significant snoring was noted. The EKG was in keeping with normal sinus rhythm (NSR).  RESPIRATORY ANALYSIS:  There were a total of 6 respiratory events:  0 obstructive apneas, 0 central apneas and 0 mixed apneas with a total of 0 apneas and an apnea index (AI) of 0 /hour. There were 6 hypopneas with a hypopnea index of 1.2 /hour. The patient also had 0 respiratory event related arousals (RERAs).      The total APNEA/HYPOPNEA INDEX (AHI) was 1.2/hour and the total RESPIRATORY DISTURBANCE INDEX was 1.2 /hour.  0 events occurred in REM sleep and 12 events in NREM. The REM AHI was 0 /hour, versus a non-REM AHI of 1.3. The patient spent 0 minutes of total sleep time in the supine position and 292 minutes in non-supine.. The supine AHI was 0.0 versus a non-supine AHI of 1.2.  OXYGEN SATURATION & C02:  The Wake baseline 02 saturation was 96%, with the lowest being 91%. Time spent below 89% saturation equaled 0 minutes.  PERIODIC LIMB MOVEMENTS:   The patient had a total of 15 Periodic Limb Movements.  The Periodic Limb Movement (PLM) index was 3.1 and the PLM Arousal index was .2/hour.  IMPRESSION: 1. Hypersomnia, unspecified 2. Dysfunctions associated with sleep stages or arousal from sleep 3. Repetitive Intrusions of Sleep  RECOMMENDATIONS:  1. This study does not demonstrate any significant obstructive or central sleep disordered breathing. Of note, the absence of supine sleep and near absence of REM sleep during the study may underestimate her sleep disordered breathing. This study does not support an intrinsic sleep disorder as a cause of the patient's symptoms. Other causes, including circadian rhythm disturbances,  an underlying mood disorder, medication effect and/or an underlying medical problem cannot be ruled out. 2. The patient should be cautioned not to drive, work at heights, or operate dangerous or heavy equipment when tired or sleepy. Review and reiteration of good sleep hygiene measures should be pursued with any patient. 3. This study shows sleep fragmentation and abnormal sleep stage percentages; these are nonspecific findings and per se do not signify an intrinsic sleep disorder or a cause for the patient's sleep-related symptoms. Causes include (but are not limited to) the first night effect of the sleep study, circadian rhythm disturbances, medication effect or an underlying mood disorder or medical problem.  4. The patient can follow-up with her referring physician. The referring provider will be notified of the test results.  I certify that I have reviewed the entire raw data recording prior to the issuance of this report in accordance with the Standards of Accreditation of the American Academy of Sleep Medicine (AASM)   Star Age, MD, PhD Diplomat, American Board of Psychiatry and Neurology (Neurology and Sleep Medicine)

## 2016-07-05 ENCOUNTER — Telehealth: Payer: Self-pay

## 2016-07-05 NOTE — Telephone Encounter (Signed)
-----   Message from Jenna Age, MD sent at 07/01/2016  3:33 PM EST ----- Patient referred by Dr. Leta Baptist, seen by me on 06/08/16, diagnostic PSG on 06/27/16.   Please call and notify the patient that the recent sleep study did not show any significant obstructive sleep apnea or significant snoring or significant leg twitching in sleep. She had mostly light stage sleep, nearly no dream sleep, no deep sleep, no sleep achieved on her back. She can follow up with Dr. Leta Baptist as planned. Also, route or fax report to PCP and referring MD, if other than PCP.  Once you have spoken to patient, you can close this encounter.   Thanks,  Jenna Age, MD, PhD [facility])

## 2016-07-05 NOTE — Telephone Encounter (Signed)
Tried to call patient 2x, just got busy tone.

## 2016-07-12 NOTE — Telephone Encounter (Signed)
Daughter called back. I gave her the results and recommendations below. She voiced understanding. I have sent results to PCP and Dr. Leta Baptist.

## 2016-07-12 NOTE — Telephone Encounter (Signed)
Pt called wanting to know sleep test results. msg relayed RN tried to call but got a busy a tone.

## 2016-09-09 ENCOUNTER — Ambulatory Visit (INDEPENDENT_AMBULATORY_CARE_PROVIDER_SITE_OTHER): Payer: Medicare Other | Admitting: Diagnostic Neuroimaging

## 2016-09-09 ENCOUNTER — Encounter: Payer: Self-pay | Admitting: Diagnostic Neuroimaging

## 2016-09-09 VITALS — BP 162/73 | HR 67 | Wt 184.2 lb

## 2016-09-09 DIAGNOSIS — R269 Unspecified abnormalities of gait and mobility: Secondary | ICD-10-CM

## 2016-09-09 DIAGNOSIS — Z6832 Body mass index (BMI) 32.0-32.9, adult: Secondary | ICD-10-CM

## 2016-09-09 DIAGNOSIS — G501 Atypical facial pain: Secondary | ICD-10-CM | POA: Diagnosis not present

## 2016-09-09 DIAGNOSIS — E669 Obesity, unspecified: Secondary | ICD-10-CM

## 2016-09-09 DIAGNOSIS — M542 Cervicalgia: Secondary | ICD-10-CM

## 2016-09-09 NOTE — Progress Notes (Signed)
PATIENT: Jenna Maldonado DOB: Oct 17, 1950  REASON FOR VISIT: follow up HISTORY FROM: patient and ex-husband  Chief Complaint  Patient presents with  . Neurogenic orthostatic hypotension    rm 6, Jimmy-- ex husband  . OSA    Dr Rexene Alberts, no sig OSA  . Follow-up    4 month, post sleep study     HISTORY OF PRESENT ILLNESS:  UPDATE 09/09/16: Since last visit, continues with HA, gait diff, nausea. Sugars are higher (200+ in AM). Had sleep study, which showed no sleep apnea. BP ranging 140's (standing) at home. Uses walker at times. Living with daughter.   UPDATE 05/09/16: Since last visit, sxs stable. Now using rollator walker and more stable. Now with increasing HA (almost daily; neck and temple pain; some nausea and some photophobia and some visual aura). Also has sleep apnea, but not using CPAP; last study was ~ 10 years ago for snoring and excessive daytime fatigue.  NEW HPI/UPDATE 01/15/16: 66 year old female here for evaluation of orthostatic hypotension. Patient was last seen in the office for right facial pain problems. In May 2017 patient developed new onset nausea, vomiting, dehydration, dizziness and lightheadedness. Patient presented to the emergency room was admitted to the hospital. She was found to have severe orthostatic hypotension. In spite of rehydration with IV fluids orthostatic hypotension continued. Neurology consult was obtained, and in the context of gradual onset gait difficulty, shuffling steps, tremor, possibility of Parkinson's or Parkinson's plus syndrome was raised. Patient was started on Florinef and Midodrine. Patient never really took Midodrine at home. She has been taking Florinef and supine systolic blood pressures ranged from 140-180. Standing blood pressures have ranged from 80 up to 100. Patient not having lightheadedness with standing up anymore.  Regarding Parkinson's symptoms, patient has had gradual onset and progressive gait deterioration, balance difficulty,  poor handwriting, intermittent tremors. However since discharge patient has improved in terms of balance, energy and walking. Patient has a Rollator walker at home but is somewhat too proud to use it. Patient living with her daughter currently. Daughter is concerned about her ability to live alone and drive. Patient would like to move back to her own home and take care of herself.  UPDATE 09/25/14 (VRP): Since last visit, tried CBZ 200mg  BID, without benefit. Now on CBZ 100mg  BID. Has stopped gabapentin.  UPDATE 05/26/14 (VRP): Since last visit, doing about the same. Reports daily, 7/10 level pain in the right face, pre-auricular, temporal, orbital, para-nasal region. Also with day time fatigue and balance diff. Taking CBZ 100/200 and gabapentin 600/600. Cannot take high gabapentin due to somnolence side effect. Also has sleep apnea (not using CPAP) and depression (on fluoxetine).   UPDATE 10/17/13 (LL): Since last visit, no change in facial pain with adding Carbamazepine.  Pain still present every day, especially in the morning.  She thinks there is swelling on the right side of her face in the temporal-cheek area. She feels pressure in her right ear, sometimes feels like fluid in the ear.  UPDATE 07/19/13 (LL): Since last visit, on gabapentin 300mg  BID, with moderate benefit, but Gabapentin makes her sleepy. She states she has more feeling in her feet. The facial pain is some better, but still bothersome. Still with right temporal, right maxillary pressure / achy pain, worse first thing in the morning.    UPDATE 11/6/149 (VP): Since last visit, on gabapentin 300mg  qhs, with mild benefit, and no side effects. Still with right temporal, right maxillary pressure / achy pain.  PRIOR HPI (01/02/13): 66 year old right-handed female with diabetes, depression, here for evaluation of right-sided facial pain. For past one year patient has right maxillary, periorbital burning pain, sometimes dull and nagging.  Sometimes she has numbness in her whole head. Sometimes she has humming/buzzing sensation in her ears. She does to wake up in the morning with nausea and more pain which subsides throughout the day. She's not tried any medications for this. No over-the-counter medications. Patient had MRI of the trigeminal nerve protocol, which showed an incidental right parotid lymphatic structure but no definite trigeminal nerve pathology.    REVIEW OF SYSTEMS: Full 14 system review of systems performed and negative except: eye pain ringing in ears leg swelling sleepiness headaches memory loss.    ALLERGIES: Allergies  Allergen Reactions  . Atorvastatin Other (See Comments)    intolerance  . Ciprofloxacin Itching  . Codeine Nausea And Vomiting  . Flagyl [Metronidazole] Itching  . Hydrocodone Nausea And Vomiting  . Pioglitazone Other (See Comments)    Weight gain  . Sulfa Antibiotics Other (See Comments)    intolerance    HOME MEDICATIONS: Outpatient Medications Prior to Visit  Medication Sig Dispense Refill  . carbamazepine (TEGRETOL XR) 100 MG 12 hr tablet Take 300 mg by mouth 2 (two) times daily.     . fludrocortisone (FLORINEF) 0.1 MG tablet Take 2 tablets (0.2 mg total) by mouth daily. 30 tablet 0  . FLUoxetine (PROZAC) 40 MG capsule Take 40 mg by mouth daily.     . Insulin Detemir (LEVEMIR FLEXTOUCH) 100 UNIT/ML Pen Inject 10 Units into the skin daily at 10 pm.    . levothyroxine (SYNTHROID, LEVOTHROID) 50 MCG tablet Take 50 mcg by mouth daily before breakfast.    . omeprazole (PRILOSEC) 20 MG capsule Take 20 mg by mouth 2 (two) times daily before a meal.     . ondansetron (ZOFRAN) 4 MG tablet Take one tablet by mouth every 8 hours as needed for nausea    . polyethylene glycol (MIRALAX / GLYCOLAX) packet Take 17 g by mouth daily. 14 each 0  . prochlorperazine (COMPAZINE) 10 MG tablet Take 10 mg by mouth every 6 (six) hours as needed for nausea or vomiting.    . senna (SENOKOT) 8.6 MG tablet As  needed  3  . pravastatin (PRAVACHOL) 20 MG tablet Take 1 tablet by mouth daily.    . bethanechol (URECHOLINE) 10 MG tablet Take 1 tablet (10 mg total) by mouth 3 (three) times daily. (Patient not taking: Reported on 09/09/2016) 30 tablet 0   No facility-administered medications prior to visit.      PHYSICAL EXAM  GENERAL EXAM/CONSTITUTIONAL: Vitals:  Vitals:   09/09/16 0921  BP: (!) 162/73  Pulse: 67  Weight: 184 lb 3.2 oz (83.6 kg)  Body mass index is 32.63 kg/m.  Orthostatic VS for the past 24 hrs (Last 3 readings):  BP- Lying Pulse- Lying BP- Sitting Pulse- Sitting BP- Standing at 0 minutes Pulse- Standing at 0 minutes  09/09/16 0927 (!) 159/91 72 162/73 67 159/81 74     Patient is in no distress; well developed, nourished and groomed; neck is supple  MASKED FACIES  CARDIOVASCULAR:  Examination of carotid arteries is normal; no carotid bruits  Regular rate and rhythm, no murmurs  Examination of peripheral vascular system by observation and palpation is normal  EYES:  Ophthalmoscopic exam of optic discs and posterior segments is normal; no papilledema or hemorrhages  MUSCULOSKELETAL:  Gait, strength, tone, movements noted in  Neurologic exam below  NEUROLOGIC: MENTAL STATUS:   awake, alert, oriented to person, place and time  recent and remote memory intact  normal attention and concentration  language fluent, comprehension intact, naming intact,   fund of knowledge appropriate  CRANIAL NERVE:   2nd - no papilledema on fundoscopic exam  2nd, 3rd, 4th, 6th - pupils equal and reactive to light, visual fields full to confrontation, extraocular muscles intact, no nystagmus  5th - facial sensation symmetric  7th - facial strength symmetric  8th - hearing intact  9th - palate elevates symmetrically, uvula midline  11th - shoulder shrug symmetric  12th - tongue protrusion midline  MOTOR:   normal bulk  INCREASED TONE IN BLE > BUE; BRADYKINESIA  IN BUE AND BLE (LEFT SLOWER THAN RIGHT)  4+/5 strength in the BUE, BLE  RARE TREMOR IN CHIN/MOUTH  SENSORY:   normal and symmetric to light touch, temperature, vibration; DECR VIB AND TEMP IN FEET  COORDINATION:   finger-nose-finger, fine finger movements --> SLOW  REFLEXES:   deep tendon reflexes TRACE and symmetric  GAIT/STATION:   narrow based gait; SHORT STEPS; DECR ARM SWING    DIAGNOSTICS/IMAGING    CBC Latest Ref Rng & Units 11/18/2015 10/26/2015 10/20/2015  WBC 4.0 - 10.5 K/uL 5.3 5.4 9.0  Hemoglobin 12.0 - 15.0 g/dL 12.0 13.5 14.0  Hematocrit 36.0 - 46.0 % 34.7(L) 38.6 39.5  Platelets 150 - 400 K/uL 140(L) 156 224   CMP Latest Ref Rng & Units 11/18/2015 11/06/2015 11/02/2015  Glucose 65 - 99 mg/dL 209(H) 222(H) 200(H)  BUN 6 - 20 mg/dL 14 16 12   Creatinine 0.44 - 1.00 mg/dL 0.58 0.57 0.53  Sodium 135 - 145 mmol/L 137 137 136  Potassium 3.5 - 5.1 mmol/L 3.7 4.2 3.9  Chloride 101 - 111 mmol/L 101 103 99(L)  CO2 22 - 32 mmol/L 31 25 31   Calcium 8.9 - 10.3 mg/dL 8.7(L) 9.1 8.7(L)  Total Protein 6.5 - 8.1 g/dL 5.8(L) - -  Total Bilirubin 0.3 - 1.2 mg/dL 0.4 - -  Alkaline Phos 38 - 126 U/L 103 - -  AST 15 - 41 U/L 15 - -  ALT 14 - 54 U/L 18 - -   Lab Results  Component Value Date   HGBA1C 6.0 (H) 10/15/2015   11/15/12 MRI TRIGEMINAL - Basilar artery is midline without compression of the cisternal aspect of the fifth cranial nerve. Symmetric normal appearance of the course of the fifth cranial nerve without mass or abnormal enhancement identified. Along the inferior medial aspect of the right parotid gland, there is a nonspecific T2 hyperintense non-enhancing 1.6 x 1.1 x 1.4 cm lesion. Etiology of this findings indeterminate. 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  (Scan completed at [facility]).  10/29/13 MRI ORBITS/NECK - Unremarkable study with normal appearing anatomy of trigeminal nerve course and  limited views of brain parenchyma. 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 No change from MRI on 11/15/12.  10/16/15 MRI brain [I reviewed images myself and agree with interpretation. -VRP]  - No acute intracranial abnormality noted.  06/27/16 PSG 1. Hypersomnia, unspecified 2. Dysfunctions associated with sleep stages or arousal from sleep 3. Repetitive Intrusions of Sleep - This study does not demonstrate any significant obstructive or central sleep disordered breathing.      ASSESSMENT AND PLAN  65 y.o. female with diabetes and depression previously here with atypical right facial pain, could be related to idiopathic trigeminal  neuralgia, diabetes, migraine phenomenon. 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  Facial pain is stable on carbamazepine.   Now here for orthostatic hypotension and parkinsonism symptoms since May 2017.   Ddx: parkinson's plus syndrome, MSA-p, parkinson's disease, diabetic autonomic neuropathy  No diagnosis found.    PLAN:  I spent 25 minutes of face to face time with patient. Greater than 50% of time was spent in counseling and coordination of care with patient. In summary we discussed:   FACIAL PAIN - continue carbamazepine 300mg  BID for facial pain (per PCP)  PARKINSONISM (gait difficulty) - continue rollator walker  - in future may consider carbidopa / levodopa; patient reluctant at this point due to polypharmacy  ORTHOSTATIC HYPOTENSION (dehydration vs parkinson's plus syndrome vs diabetic autonomic neuropathy) - continue florinef for neurogenic orthostatic hypotension; may need to be adjusted down (per PCP) - stay off midodrine (due to supine hypertension)  HEADACHES/MIGRAINE - in future may consider gabapentin or topiramate (caution with left kidney stone) for migraine prevention  GENERAL HEALTH HABITS -  encouraged patient to improve nutrition    Return if symptoms worsen or fail to improve, for return to PCP.     Penni Bombard, MD 01/29/5908, 3:11 AM Certified in Neurology, Neurophysiology and Neuroimaging  [facility] [address], [place], [place] [address] [phone]

## 2016-09-09 NOTE — Patient Instructions (Signed)
FACIAL PAIN - continue carbamazepine 300mg  TWICE A DAY for facial pain (per PCP)  BALANCE AND WALKING PROBLEM - use rollator walker  - consider physical therapy  DIZZINESS / LOW BLOOD PRESSURE  - monitor blood pressure laying and standing - continue florinef; may need to be adjusted down (per PCP)

## 2016-11-21 ENCOUNTER — Encounter: Payer: Self-pay | Admitting: Neurology

## 2016-11-21 ENCOUNTER — Telehealth: Payer: Self-pay | Admitting: Neurology

## 2016-12-13 ENCOUNTER — Encounter (INDEPENDENT_AMBULATORY_CARE_PROVIDER_SITE_OTHER): Payer: Self-pay | Admitting: Internal Medicine

## 2016-12-13 ENCOUNTER — Encounter (INDEPENDENT_AMBULATORY_CARE_PROVIDER_SITE_OTHER): Payer: Self-pay

## 2016-12-27 ENCOUNTER — Ambulatory Visit (INDEPENDENT_AMBULATORY_CARE_PROVIDER_SITE_OTHER): Payer: Medicare Other | Admitting: Internal Medicine

## 2017-01-13 ENCOUNTER — Ambulatory Visit: Payer: Medicare Other | Admitting: Urology

## 2017-01-30 ENCOUNTER — Telehealth: Payer: Self-pay | Admitting: Diagnostic Neuroimaging

## 2017-01-30 NOTE — Telephone Encounter (Signed)
Patient's daughter calling to discuss dosage for carbamazepine (TEGRETOL XR) 100 MG 12 hr tablet.

## 2017-01-30 NOTE — Telephone Encounter (Signed)
Spoke with daughter, Joseph Art, on [place] who questioned the dose of Carbamazepine the doctor wanted her mother to take. This RN advised Renee that the patient's PCP prescribed that medication, not Dr Leta Baptist. Renee stated that was what she had thought but wanted to confirm that. She verbalized understanding, appreciation for call.

## 2017-02-07 NOTE — Telephone Encounter (Signed)
error 

## 2017-02-09 ENCOUNTER — Telehealth: Payer: Self-pay

## 2017-02-09 NOTE — Telephone Encounter (Signed)
Pt's daughter called to see about an referral. I told her that I did not see anything, I got Almyra Free to look behind me and she found a note from the PCP to Dr.Rehman's office. I told her to call her PC back and ask them to send the referral to [place].

## 2017-02-15 NOTE — Telephone Encounter (Signed)
Okay 

## 2017-02-15 NOTE — Telephone Encounter (Signed)
Patient is scheduled to see Magda Paganini and her sister is aware of the appt

## 2017-02-15 NOTE — Telephone Encounter (Signed)
Patient would like to switch her care here to see Dr [name].  She would like to be seen here because, it's more convenient.    Patient's sister can be reached at [phone].  Routing to Dr. [name] for approval.

## 2017-02-22 ENCOUNTER — Ambulatory Visit (INDEPENDENT_AMBULATORY_CARE_PROVIDER_SITE_OTHER): Payer: Medicare Other | Admitting: Neurology

## 2017-02-22 ENCOUNTER — Encounter: Payer: Self-pay | Admitting: Neurology

## 2017-02-22 ENCOUNTER — Other Ambulatory Visit: Payer: Self-pay | Admitting: Neurology

## 2017-02-22 VITALS — BP 110/70 | HR 86 | Ht 63.0 in | Wt 172.4 lb

## 2017-02-22 DIAGNOSIS — I6349 Cerebral infarction due to embolism of other cerebral artery: Secondary | ICD-10-CM

## 2017-02-22 DIAGNOSIS — G501 Atypical facial pain: Secondary | ICD-10-CM | POA: Diagnosis not present

## 2017-02-22 DIAGNOSIS — R51 Headache: Secondary | ICD-10-CM | POA: Diagnosis not present

## 2017-02-22 DIAGNOSIS — G903 Multi-system degeneration of the autonomic nervous system: Secondary | ICD-10-CM

## 2017-02-22 DIAGNOSIS — R519 Headache, unspecified: Secondary | ICD-10-CM

## 2017-02-22 DIAGNOSIS — G20 Parkinson's disease: Secondary | ICD-10-CM

## 2017-02-22 NOTE — Progress Notes (Signed)
[facility] Clinic Note - Initial Visit   Date: 02/22/17  Jenna Maldonado MRN: [id number] DOB: 1951-05-09   Dear Dr. [name]:  Thank you for your kind referral of Jenna Maldonado for consultation of headaches. Although her history is well known to you, please allow [place] to reiterate it for the purpose of our medical record. The patient was accompanied to the clinic by niece, [name], who also provides collateral information.     History of Present Illness: Jenna Maldonado is a 66 y.o. 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     She has been seeing Dr. Leta Baptist at [facility] since 2014 for headaches, gait difficulty, orthostasis, and parkinson's disease.  She was referred for a second opinion of right facial pain.  Pain started in 2013 involving the right maxillary, periorbital, and aural burning pain. Previous MRI with attention to the trigeminal nerve did not show any vascular impingement or pathology of the nerve.  There was an incidental right parotid lymphatic structure and she was recommended to see ENT.  Headaches markedly improved since since having surgery for retinal detachment. Right facial pain is controlled on carbamazepine 300mg  BID.  She has previously on gabapentin 600mg  BID due to ineffectiveness.   Today, patient's niece is more concerned about new findings of stroke that were seen on her MRI brain from June 2018.  Her daughter who lives with her started noticing change in speech and veering to left side, which prompted her to go to see her PCP who ordered MRI brain.  MRI brain wo contrast from June 2018 shows multiple bilateral frontal white matter infarcts of mixed ages, late acute to early subacute.  These infarcts are in a small vessel distribution  compatible with the patient's diagnosis of diabetes mellitus.  A central cardioembolic process could also be possible.  There was also note of chronic left thalamic infarct.  She does not take any antiplatelet therapy.  She takes pravastatin 40mg  for cholesterol, last LDL is not known.  Diabetes is well-controlled on medications with her HbA1c ~6.  No prior history of tobacco use or family history of stroke.    She has an appointment with GI for her persistent nausea and vomiting for the past two years.  This started after her cholecystectomy.   Out-side paper records, electronic medical record, and images have been reviewed where available and summarized as:  MRI brain wo contrast 11/22/2016: Multiple bilateral frontal white matter infarcts of mixed ages, late acute to early subacute.  These infarcts are in a small vessel distribution compatiable with the patient's diagnosis of diabetes mellitus.  A central cardioembolic process could also be possible.  Chronic left thalamic infarct.  MRI cervical spine wo contrast 02/06/2016: 1. Mild cervical spine spondylosis as described above without evidence of nerve root impingement.  MRI brain 10/16/2015:  No acute intracranial abnormality noted.  Please see above.    Past Medical History:  Diagnosis Date  . 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 Calculus of kidney    L inferior kidney ; non obstructing  . Chronic constipation   . Chronic headaches   . Depression   . Diabetic autonomic neuropathy ([place])   . Diabetic neuropathy ([place])   . Esophageal reflux   . Hypothyroidism, adult   . Orthostatic hypotension   . Residual foreign body in soft tissue   .  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 Shortness of breath     Past Surgical History:  Procedure Laterality Date  . carpel tunnel    . CHOLECYSTECTOMY  11/2014   June 2016  . Eye Surgrey Bilateral    4-5 times for macular degeneration  . HAND SURGERY Bilateral  2001  . SHOULDER SURGERY Left   . TUBAL LIGATION       Medications:  Outpatient Encounter Prescriptions as of 02/22/2017  Medication Sig Note  . carbamazepine (TEGRETOL XR) 100 MG 12 hr tablet Take 300 mg by mouth 2 (two) times daily.    . fludrocortisone (FLORINEF) 0.1 MG tablet Take 2 tablets (0.2 mg total) by mouth daily.   [name] FLUoxetine (PROZAC) 40 MG capsule Take 40 mg by mouth daily.    . Insulin Detemir (LEVEMIR FLEXTOUCH) 100 UNIT/ML Pen Inject 10 Units into the skin daily at 10 pm. 01/15/2016: 01/15/16 25 units daily  . levothyroxine (SYNTHROID, LEVOTHROID) 50 MCG tablet Take 50 mcg by mouth daily before breakfast.   . metFORMIN (GLUCOPHAGE) 500 MG tablet Take by mouth 2 (two) times daily with a meal.   . metoCLOPramide (REGLAN) 5 MG tablet Take 5 mg by mouth 3 (three) times daily.   . NYSTATIN powder    . omeprazole (PRILOSEC) 20 MG capsule Take 20 mg by mouth 2 (two) times daily before a meal.    . polyethylene glycol (MIRALAX / GLYCOLAX) packet Take 17 g by mouth daily. 01/15/2016: As needed  . pravastatin (PRAVACHOL) 40 MG tablet 40 mg daily.   . promethazine (PHENERGAN) 25 MG tablet TAKE ONE-HALF TO ONE TABLET BY MOUTH EVERY 6 HOURS AS NEEDED   . senna (SENOKOT) 8.6 MG tablet As needed 01/15/2016: Received from: External Pharmacy Received Sig: TAKE 2 TABLETS BY MOUTH TWICE A DAY  . [DISCONTINUED] bethanechol (URECHOLINE) 10 MG tablet Take 1 tablet (10 mg total) by mouth 3 (three) times daily. 05/09/2016: 05/09/16 has not refilled recently  . [DISCONTINUED] ondansetron (ZOFRAN) 4 MG tablet Take one tablet by mouth every 8 hours as needed for nausea   . [DISCONTINUED] prochlorperazine (COMPAZINE) 10 MG tablet Take 10 mg by mouth every 6 (six) hours as needed for nausea or vomiting.    No facility-administered encounter medications on file as of 02/22/2017.      Allergies:  Allergies  Allergen Reactions  . Atorvastatin Other (See Comments)    intolerance  . Ciprofloxacin Itching    . Codeine Nausea And Vomiting  . Flagyl [Metronidazole] Itching  . Hydrocodone Nausea And Vomiting  . Pioglitazone Other (See Comments)    Weight gain  . Sulfa Antibiotics Other (See Comments)    intolerance    Family History: Family History  Problem Relation Age of Onset  . Cancer Mother   . Diabetes Mother   . Suicidality Father        GSW  . Diabetes Sister        3 sisters  . Heart disease Brother        MI  . Diabetes Brother        2 brothers  . Stroke Neg Hx     Social History: Social History  Substance Use Topics  . Smoking status: Never Smoker  . Smokeless tobacco: Never Used  . Alcohol use No   Social History   Social History Narrative   01/15/16 Patient lives with daughter, Joseph Art   Caffeine Use: occasionally    Review of  Systems:  CONSTITUTIONAL: No fevers, chills, night sweats, or weight loss.   EYES: +visual changes or eye pain ENT: No hearing changes.  No history of nose bleeds.   RESPIRATORY: No cough, wheezing and shortness of breath.   CARDIOVASCULAR: Negative for chest pain, and palpitations.   GI: Negative for abdominal discomfort, blood in stools or black stools.  No recent change in bowel habits.   GU:  +history of incontinence.   MUSCLOSKELETAL: No history of joint pain or swelling.  No myalgias.   SKIN: Negative for lesions, rash, and itching.   HEMATOLOGY/ONCOLOGY: Negative for prolonged bleeding, bruising easily, and swollen nodes.  No history of cancer.   ENDOCRINE: Negative for cold or heat intolerance, polydipsia or goiter.   PSYCH:  No depression or anxiety symptoms.   NEURO: As Above.   Vital Signs:  BP 110/70   Pulse 86   Ht 5\' 3"  (1.6 m)   Wt 172 lb 7 oz (78.2 kg)   SpO2 98%   BMI 30.55 kg/m    General Medical Exam:   General:  Slightly ill-appearing, comfortable, she has been vomiting .   Eyes/ENT: see cranial nerve examination.   Neck: No masses appreciated.  Full range of motion without tenderness.  No carotid  bruits. Respiratory:  Clear to auscultation, good air entry bilaterally.   Cardiac:  Regular rate and rhythm, no murmur.   Extremities:  No deformities, edema, or skin discoloration.  Skin:  No rashes or lesions.  Neurological Exam: MENTAL STATUS including orientation to time, place, person, recent and remote memory, attention span and concentration, language, and fund of knowledge is normal.  Speech is not dysarthric.  Naming and repetition intact.   CRANIAL NERVES: II:  Poor vision in the right eye due to retinopathy, increased vascularity on fundoscopic exam on the right.  Visual fields intact on the left.   III-IV-VI: Pupils equal round and slowly reactive to light.  Normal conjugate, extra-ocular eye movements in all directions of gaze.  No nystagmus.  Right ptosis (old).   V:  Normal facial sensation.    VII:  Normal facial symmetry and movements.  No pathologic facial reflexes.  VIII:  Normal hearing and vestibular function.   IX-X:  Normal palatal movement.   XI:  Normal shoulder shrug and head rotation.   XII:  Normal tongue strength and range of motion, no deviation or fasciculation.  MOTOR:  Motor strength is 5/5 throughout.  No atrophy, fasciculations or abnormal movements.  I do not appreciate any tremor. No pronator drift.  Tone is normal.    MSRs:  Right                                                                 Left brachioradialis 2+  brachioradialis 2+  biceps 2+  biceps 2+  triceps 2+  triceps 2+  patellar 2+  patellar 2+  ankle jerk 0  ankle jerk 0  Hoffman no  Hoffman no  plantar response down  plantar response down   SENSORY:  Vibration, temperature, and pin prick is reduced distal to ankles bilaterally.  Romberg's sign is present.   COORDINATION/GAIT: Mild dysmetria with finger-to- nose-finger testing.  There is reduced amplitude of finger tapping on the left as compared to  the right.  Able to rise from a chair without using arms.  Gait is mildly stooped,  essentially no arm swing bilaterally, and narrow based, with small steps, unassisted.     IMPRESSION: 1.  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  Lengthy discussion regarding stroke pathology, mechanism, prognosis, and management options. She was informed that stroke is a [employer] and if she develops any new stroke-like symptoms, to seek medical care immediately.  I would like to view the images personally and I have requested family to mail CD to me.  Cardioembolic source needs to be evaluated.  She needs evaluation for stroke mechanism as noted below.    - Check US carotids, MRA head  - Echocardiagram with bubble  - 48 hour holter monitor, may need extended cardiac monitor going forward  - Check fasting lipid panel, B12, CMP  - Start aspirin 81mg  daily  - Continue pravastatin 40mg  daily  2.  Right atypical facial pain is well-controlled on carbamazepine 300mg  BID.  May consider tapering this in the future.  3.  Subtle parkinsonian features with urinary incontinence, gait ataxia, and orthostasis may suggest early signs of atypical parkinson disease, such as multiple system atrophy. She does not have prominent tremor or rigidity, but gait appears mildly stooped with minimal arm swing.  May consider a trial of dopamine agonist going forward.  Because these medication can cause nausea, I do not favor starting this with her persistent nausea/vomiting.  She will be seeing GI for evaluation later this month.  She may continue florinef 0.1mg  BID for her orthostasis.  Fall precautions discussed, encouraged to use cane/walker.   4.  Diabetic peripheral neuropathy, stable.  Symptoms are predominately numbness and painful paresthesias seem to be well-controlled on carbamazepine.  She has been on gabapentin in the past which did not help.  Encouraged her to continue to keep tight control of her blood sugars.   Return to clinic in 2-3  months.   The duration of this appointment visit was 50 minutes of face-to-face time with the patient.  Greater than 50% of this time was spent in counseling, explanation of diagnosis, planning of further management, and coordination of care.   Thank you for allowing me to participate in patient's care.  If I can answer any additional questions, I would be pleased to do so.    Sincerely,    Persais Ethridge K. Posey Pronto, DO

## 2017-02-22 NOTE — Patient Instructions (Addendum)
Start aspirin 81mg  daily  Continue pravastatin 40mg  daily  Check labs  We will check MRA head and US carotids to evaluate your blood vessels  Echocardiogram will be ordered to check any structural changes of the heart that can predispose you to stroke  Cardiac monitor  Please mail a CD of your MRI brain from June 2018 to my office  Return to clinic in 2-3 months  Stroke is a medical emergency.  Know these warning signs of stroke. Every second counts:  Sudden numbness or weakness of the face, arm or leg, especially on one side of the body,  Sudden confusion, trouble speaking or understanding,  Sudden trouble seeing in one eye or both eyes,  Sudden trouble walking, dizziness, loss of balance or coordination,  Sudden severe headache with no known cause.  If you or someone with you has one or more of these signs, don't delay!  Immediately call [id number], or the emergency medical services (EMS) number so an ambulance (ideally with advanced life support) can be sent for you.  Also, check the time so that you will know when the symptoms first appeared. It's very important to take immediate action. Medical treatment may be available if action is taken early enough.

## 2017-03-06 LAB — COMPLETE METABOLIC PANEL WITH GFR
AG Ratio: 1.5 (calc) (ref 1.0–2.5)
ALBUMIN MSPROF: 4.1 g/dL (ref 3.6–5.1)
ALKALINE PHOSPHATASE (APISO): 99 U/L (ref 33–130)
ALT: 21 U/L (ref 6–29)
AST: 17 U/L (ref 10–35)
BUN: 18 mg/dL (ref 7–25)
CALCIUM: 8.8 mg/dL (ref 8.6–10.4)
CO2: 29 mmol/L (ref 20–32)
Chloride: 99 mmol/L (ref 98–110)
Creat: 0.78 mg/dL (ref 0.50–0.99)
GFR, EST NON AFRICAN AMERICAN: 80 mL/min/{1.73_m2} (ref >=60)
GFR, Est African American: 92 mL/min/{1.73_m2} (ref >=60)
GLOBULIN: 2.7 g/dL (ref 1.9–3.7)
GLUCOSE: 161 mg/dL — AB (ref 65–99)
Potassium: 3.6 mmol/L (ref 3.5–5.3)
SODIUM: 141 mmol/L (ref 135–146)
TOTAL PROTEIN: 6.8 g/dL (ref 6.1–8.1)
Total Bilirubin: 0.4 mg/dL (ref 0.2–1.2)

## 2017-03-06 LAB — LIPID PANEL
CHOLESTEROL: 207 mg/dL — AB (ref <200)
HDL: 42 mg/dL — ABNORMAL LOW (ref >50)
LDL CHOLESTEROL (CALC): 121 mg/dL — AB
Non-HDL Cholesterol (Calc): 165 mg/dL (calc) — ABNORMAL HIGH (ref <130)
Total CHOL/HDL Ratio: 4.9 (calc) (ref <5.0)
Triglycerides: 285 mg/dL — ABNORMAL HIGH (ref <150)

## 2017-03-06 LAB — VITAMIN B12: VITAMIN B 12: 237 pg/mL (ref 200–1100)

## 2017-03-07 ENCOUNTER — Other Ambulatory Visit: Payer: Self-pay | Admitting: Neurology

## 2017-03-07 ENCOUNTER — Telehealth: Payer: Self-pay | Admitting: *Deleted

## 2017-03-07 ENCOUNTER — Telehealth: Payer: Self-pay | Admitting: Neurology

## 2017-03-07 DIAGNOSIS — I6349 Cerebral infarction due to embolism of other cerebral artery: Secondary | ICD-10-CM

## 2017-03-07 DIAGNOSIS — R51 Headache: Secondary | ICD-10-CM

## 2017-03-07 DIAGNOSIS — I452 Bifascicular block: Secondary | ICD-10-CM

## 2017-03-07 DIAGNOSIS — R519 Headache, unspecified: Secondary | ICD-10-CM

## 2017-03-07 DIAGNOSIS — I4891 Unspecified atrial fibrillation: Secondary | ICD-10-CM

## 2017-03-07 DIAGNOSIS — I639 Cerebral infarction, unspecified: Secondary | ICD-10-CM

## 2017-03-07 NOTE — Telephone Encounter (Signed)
Patient daughter wants to know if we got a Disc from [facility] in [place] please call her and let her know thank you

## 2017-03-07 NOTE — Telephone Encounter (Signed)
Left message for patient to call me back. 

## 2017-03-07 NOTE — Telephone Encounter (Signed)
-----   Message from Alda Berthold, DO sent at 03/06/2017  2:58 PM EDT ----- Please inform patient that her cholesterol is high and I would like to switch her to lipitor 80mg  daily and recheck in 3 months (pls send rx).  Stop pravastatin.  Also vitamin B12 is low and recommend that she start vitamin B12 1068mcg IM injection daily x 7 days, weekly x 4 weeks, then monthly thereafter x 1 year, if there is someone at home who can administered.  If not, start vitamin B12 1037mcg daily OTC. Finally, please ask them to follow-up on the CD of her images - I have not rec'd anything yet.  Thanks.

## 2017-03-07 NOTE — Telephone Encounter (Signed)
Called Jenna Maldonado back and left message that I did get the CD this morning.  Requested for her or her mother to call me back for lab results.

## 2017-03-08 ENCOUNTER — Telehealth: Payer: Self-pay | Admitting: *Deleted

## 2017-03-08 ENCOUNTER — Ambulatory Visit (INDEPENDENT_AMBULATORY_CARE_PROVIDER_SITE_OTHER): Payer: Medicare Other | Admitting: *Deleted

## 2017-03-08 ENCOUNTER — Ambulatory Visit (INDEPENDENT_AMBULATORY_CARE_PROVIDER_SITE_OTHER): Payer: Medicare Other

## 2017-03-08 ENCOUNTER — Other Ambulatory Visit: Payer: Self-pay

## 2017-03-08 ENCOUNTER — Ambulatory Visit (HOSPITAL_COMMUNITY)
Admission: RE | Admit: 2017-03-08 | Discharge: 2017-03-08 | Disposition: A | Discharge status: Home / Self Care | Payer: Medicare Other | Source: Ambulatory Visit | Attending: Cardiology | Admitting: Cardiology

## 2017-03-08 ENCOUNTER — Ambulatory Visit (HOSPITAL_BASED_OUTPATIENT_CLINIC_OR_DEPARTMENT_OTHER): Payer: Medicare Other

## 2017-03-08 ENCOUNTER — Other Ambulatory Visit: Payer: Self-pay | Admitting: *Deleted

## 2017-03-08 DIAGNOSIS — R51 Headache: Secondary | ICD-10-CM

## 2017-03-08 DIAGNOSIS — I6349 Cerebral infarction due to embolism of other cerebral artery: Secondary | ICD-10-CM

## 2017-03-08 DIAGNOSIS — I452 Bifascicular block: Secondary | ICD-10-CM

## 2017-03-08 DIAGNOSIS — R519 Headache, unspecified: Secondary | ICD-10-CM

## 2017-03-08 DIAGNOSIS — E538 Deficiency of other specified B group vitamins: Secondary | ICD-10-CM | POA: Diagnosis not present

## 2017-03-08 DIAGNOSIS — I4891 Unspecified atrial fibrillation: Secondary | ICD-10-CM | POA: Diagnosis not present

## 2017-03-08 DIAGNOSIS — G20 Parkinson's disease: Secondary | ICD-10-CM

## 2017-03-08 DIAGNOSIS — I639 Cerebral infarction, unspecified: Secondary | ICD-10-CM

## 2017-03-08 MED ORDER — ATORVASTATIN CALCIUM 80 MG PO TABS: 80.0000 mg | ORAL_TABLET | Freq: Every day | ORAL | 5 refills | Status: DC

## 2017-03-08 MED ORDER — CYANOCOBALAMIN 1000 MCG/ML IJ SOLN
1000.0000 ug | Freq: Once | INTRAMUSCULAR | Status: AC
  Administered 2017-03-08: 1000 ug via INTRAMUSCULAR

## 2017-03-08 NOTE — Telephone Encounter (Signed)
I spoke with Mardene Celeste (patient's daughter) and gave the results and instructions.  Rx sent in.  She will call me back about the B12.  Lab orders mailed.

## 2017-03-08 NOTE — Telephone Encounter (Signed)
-----   Message from Alda Berthold, DO sent at 03/06/2017  2:58 PM EDT ----- Please inform patient that her cholesterol is high and I would like to switch her to lipitor 80mg  daily and recheck in 3 months (pls send rx).  Stop pravastatin.  Also vitamin B12 is low and recommend that she start vitamin B12 1065mcg IM injection daily x 7 days, weekly x 4 weeks, then monthly thereafter x 1 year, if there is someone at home who can administered.  If not, start vitamin B12 1028mcg daily OTC. Finally, please ask them to follow-up on the CD of her images - I have not rec'd anything yet.  Thanks.

## 2017-03-09 ENCOUNTER — Other Ambulatory Visit: Payer: Self-pay | Admitting: *Deleted

## 2017-03-09 MED ORDER — CYANOCOBALAMIN 1000 MCG/ML IJ SOLN: 1000.0000 ug | Freq: Once | INTRAMUSCULAR | 0 refills | Status: AC

## 2017-03-09 MED ORDER — "SYRINGE 23G X 1"" 3 ML MISC": 1.0000 | Freq: Once | 0 refills | Status: AC

## 2017-03-14 ENCOUNTER — Telehealth: Payer: Self-pay | Admitting: *Deleted

## 2017-03-14 NOTE — Telephone Encounter (Signed)
Called Renee and gave her the results.  Called [facility] and they said that they have left messages for the patient to call them back to schedule MRA.  Joseph Art will call today to get that scheduled.

## 2017-03-14 NOTE — Telephone Encounter (Signed)
-----   Message from Alda Berthold, DO sent at 03/09/2017  4:00 PM EDT ----- Please inform patient that her carotid ultrasounds and echocardiogram great. I have reviewed her MRI of the brain which does show small strokes on the left side. We will await the results of her MRA head which needs to be scheduled and her cardiac monitoring.  Thanks.

## 2017-03-15 ENCOUNTER — Telehealth: Payer: Self-pay | Admitting: *Deleted

## 2017-03-15 NOTE — Telephone Encounter (Signed)
-----   Message from Alda Berthold, DO sent at 03/15/2017 11:05 AM EDT ----- Please inform patient that her cardiac monitoring did not show any abnormal heart rhythms.  So far, her stroke work-up is all looking good.  MRA is scheduled for later this month and we will call her with these results.

## 2017-03-15 NOTE — Telephone Encounter (Signed)
Patient's daughter given results.  

## 2017-03-23 ENCOUNTER — Ambulatory Visit
Admission: RE | Admit: 2017-03-23 | Discharge: 2017-03-23 | Disposition: A | Discharge status: Home / Self Care | Payer: Medicare Other | Source: Ambulatory Visit | Attending: Neurology | Admitting: Neurology

## 2017-03-23 DIAGNOSIS — I6349 Cerebral infarction due to embolism of other cerebral artery: Secondary | ICD-10-CM

## 2017-03-23 DIAGNOSIS — R519 Headache, unspecified: Secondary | ICD-10-CM

## 2017-03-23 DIAGNOSIS — R51 Headache: Principal | ICD-10-CM

## 2017-03-27 ENCOUNTER — Telehealth: Payer: Self-pay | Admitting: *Deleted

## 2017-03-27 NOTE — Telephone Encounter (Signed)
Left message with patient's daughter letting her know that results look good.  Instructed her to call if patient has any new neuro symptoms since we may need to do extended cardiac monitoring.

## 2017-03-27 NOTE — Telephone Encounter (Signed)
-----   Message from Alda Berthold, DO sent at 03/25/2017  9:08 PM EDT ----- Please inform patient that her blood vessels to the brain look great, no narrowing. So far, all her stroke testing looks normal and no clear cause has been found.  If she has any new neurological events, we will need to get extended cardiac monitoring for 30-days.

## 2017-04-03 ENCOUNTER — Other Ambulatory Visit: Payer: Self-pay | Admitting: Neurology

## 2017-04-03 ENCOUNTER — Encounter: Payer: Self-pay | Admitting: Gastroenterology

## 2017-04-03 ENCOUNTER — Ambulatory Visit (INDEPENDENT_AMBULATORY_CARE_PROVIDER_SITE_OTHER): Payer: Medicare Other | Admitting: Gastroenterology

## 2017-04-03 DIAGNOSIS — K3184 Gastroparesis: Secondary | ICD-10-CM | POA: Diagnosis not present

## 2017-04-03 DIAGNOSIS — E1143 Type 2 diabetes mellitus with diabetic autonomic (poly)neuropathy: Secondary | ICD-10-CM | POA: Diagnosis not present

## 2017-04-03 NOTE — Progress Notes (Signed)
cc'ed to pcp °
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
## 2017-04-03 NOTE — Patient Instructions (Signed)
1. Will obtain a copy of your records for review. 2. Please call in 2 weeks and let me know if you are still doing well with regards to nausea and vomiting. 3. In the meantime, use Reglan 30 minutes prior to your meals, up to 3 times daily but at the lowest dose to control your symptoms. 4. Please use MiraLAX half to 1 capful daily as needed to control constipation. 5. Consume small meals 5-6 times daily as opposed to large meals.  Avoid foods as they are harder to digest.  Make sure that you stay up at least 2-3 hours after meals, do not lay down prior to this. 6. If you are having significant nausea and/or vomiting, cut back to liquids only until your symptoms resolve.
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
## 2017-04-26 IMAGING — DX DG ORBITS FOR FOREIGN BODY
2 series · 2 of 2 positions shown · non-contrast
Comparison: None.

CLINICAL DATA: Intraorbital implant; clearance prior to MRI

EXAM:
ORBITS FOR FOREIGN BODY - 2 VIEW

[orbits waters (1 of 2)]
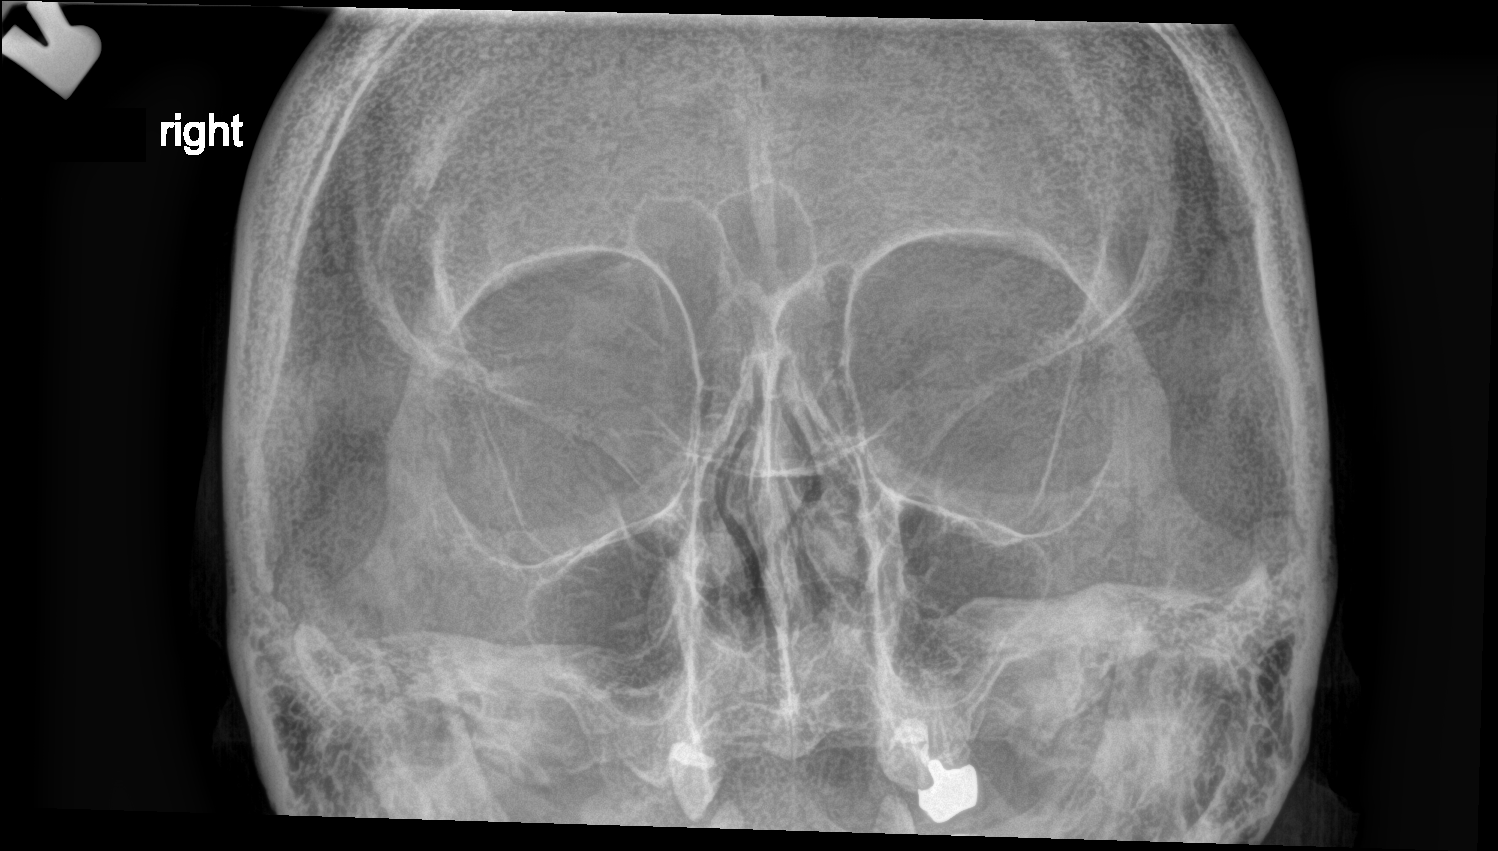

[orbits waters (2 of 2)]
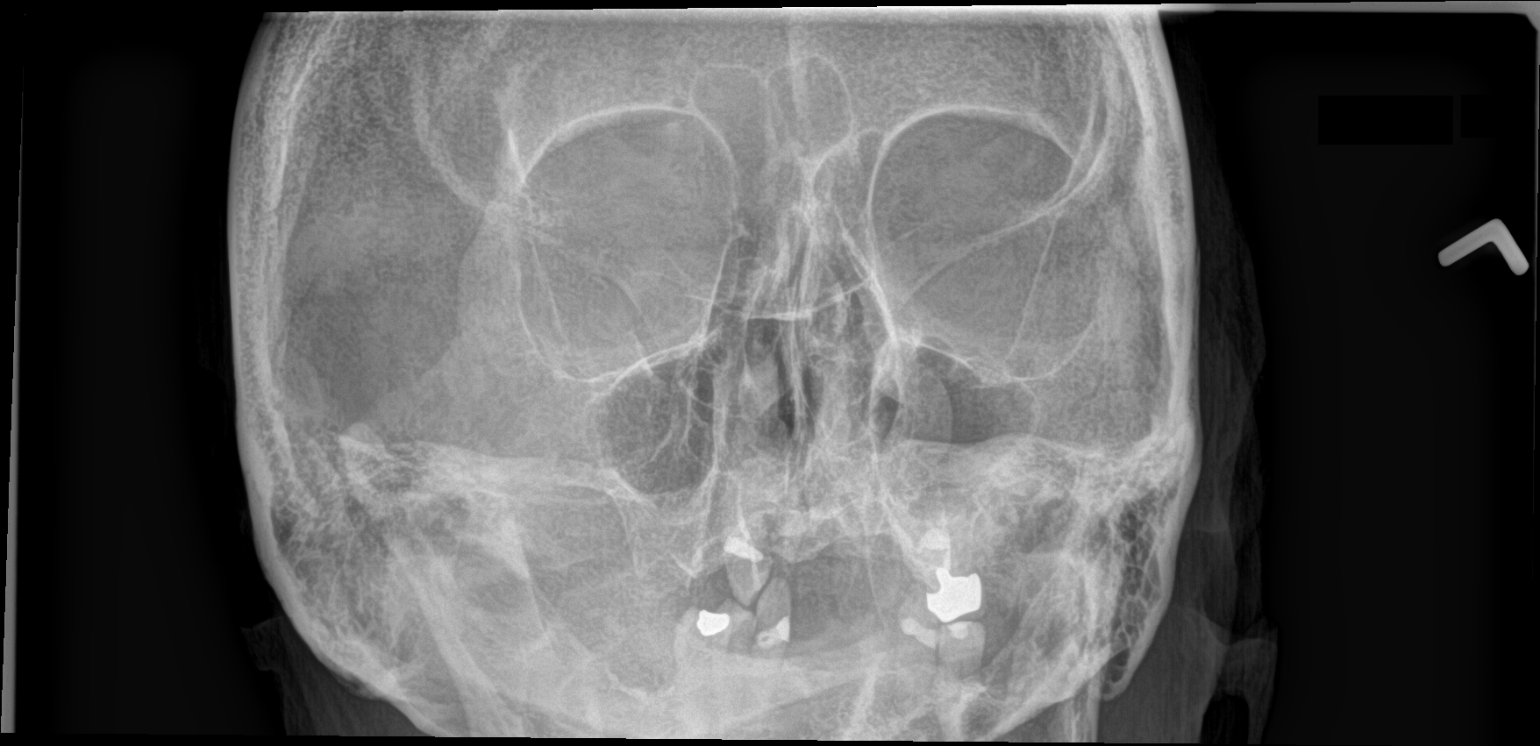

[2 of 2 positions shown; findings below may reference images not displayed]

FINDINGS: Water's views with eyes deviated toward the left and toward the
right obtained. There is no intraorbital radiopaque foreign body.
Paranasal sinuses are clear. No fracture or dislocation.
IMPRESSION: No evidence of metallic foreign body within the orbits.

## 2017-04-27 ENCOUNTER — Telehealth: Payer: Self-pay | Admitting: *Deleted

## 2017-04-27 DIAGNOSIS — Z79899 Other long term (current) drug therapy: Secondary | ICD-10-CM

## 2017-04-27 NOTE — Telephone Encounter (Signed)
Spoke with Mardene Celeste (dpr on file) and she is calling with an update on pt. She reports the nausea/vomiting has not improved at all, if anything it is getting worse. She is also having diarrhea but unsure how many times per day. She is using Reglan 30 minutes prior to meals and she is consuming small meals about 5-6 times daily. Please advise Magda Paganini thanks

## 2017-04-28 NOTE — Telephone Encounter (Signed)
Spoke with Feliberto Harts) regarding patient. Patient had moved home with daughter over a week ago and has been very sick with daily vomiting and some loose stool. She had went to check on her when she gave her her B12 injection. However she found out that the patient was taking her medication inappropriately.  She had continued pravastatin a total of 60 mg daily after her medication had been switched to atorvastatin.  She is actually taking a total of 160 mg of atorvastatin.  She was only taken Reglan 5 mg once daily.  She was taking promethazine no more than once daily.  Advised her to have the patient take Reglan 5 mg before each meal, monitor for side effects as discussed during office visit.  Utilize Phenergan as needed.  We will check LFTs as a precautionary maneuver given significant statin intake as outlined.  And she will call next week if her symptoms are not improved.  Of note patient was having loose stools on senna, Mardene Celeste has now stopped the senna and will monitor patient.

## 2017-04-28 NOTE — Addendum Note (Signed)
Addended by: Mahala Menghini on: 04/28/2017 12:35 PM   Modules accepted: Orders

## 2017-04-29 LAB — HEPATIC FUNCTION PANEL
AG RATIO: 1.4 (calc) (ref 1.0–2.5)
ALT: 26 U/L (ref 6–29)
AST: 32 U/L (ref 10–35)
Albumin: 4 g/dL (ref 3.6–5.1)
Alkaline phosphatase (APISO): 87 U/L (ref 33–130)
BILIRUBIN DIRECT: 0.1 mg/dL (ref 0.0–0.2)
BILIRUBIN INDIRECT: 0.4 mg/dL (ref 0.2–1.2)
BILIRUBIN TOTAL: 0.5 mg/dL (ref 0.2–1.2)
GLOBULIN: 2.8 g/dL (ref 1.9–3.7)
Total Protein: 6.8 g/dL (ref 6.1–8.1)

## 2017-04-30 IMAGING — CR DG CHEST 1V PORT
1 series · 1 of 1 positions shown · non-contrast
Comparison: 10/06/2015

CLINICAL DATA: Increasing shortness of breath with low-grade fever
this morning.

EXAM:
PORTABLE CHEST 1 VIEW

[AP]
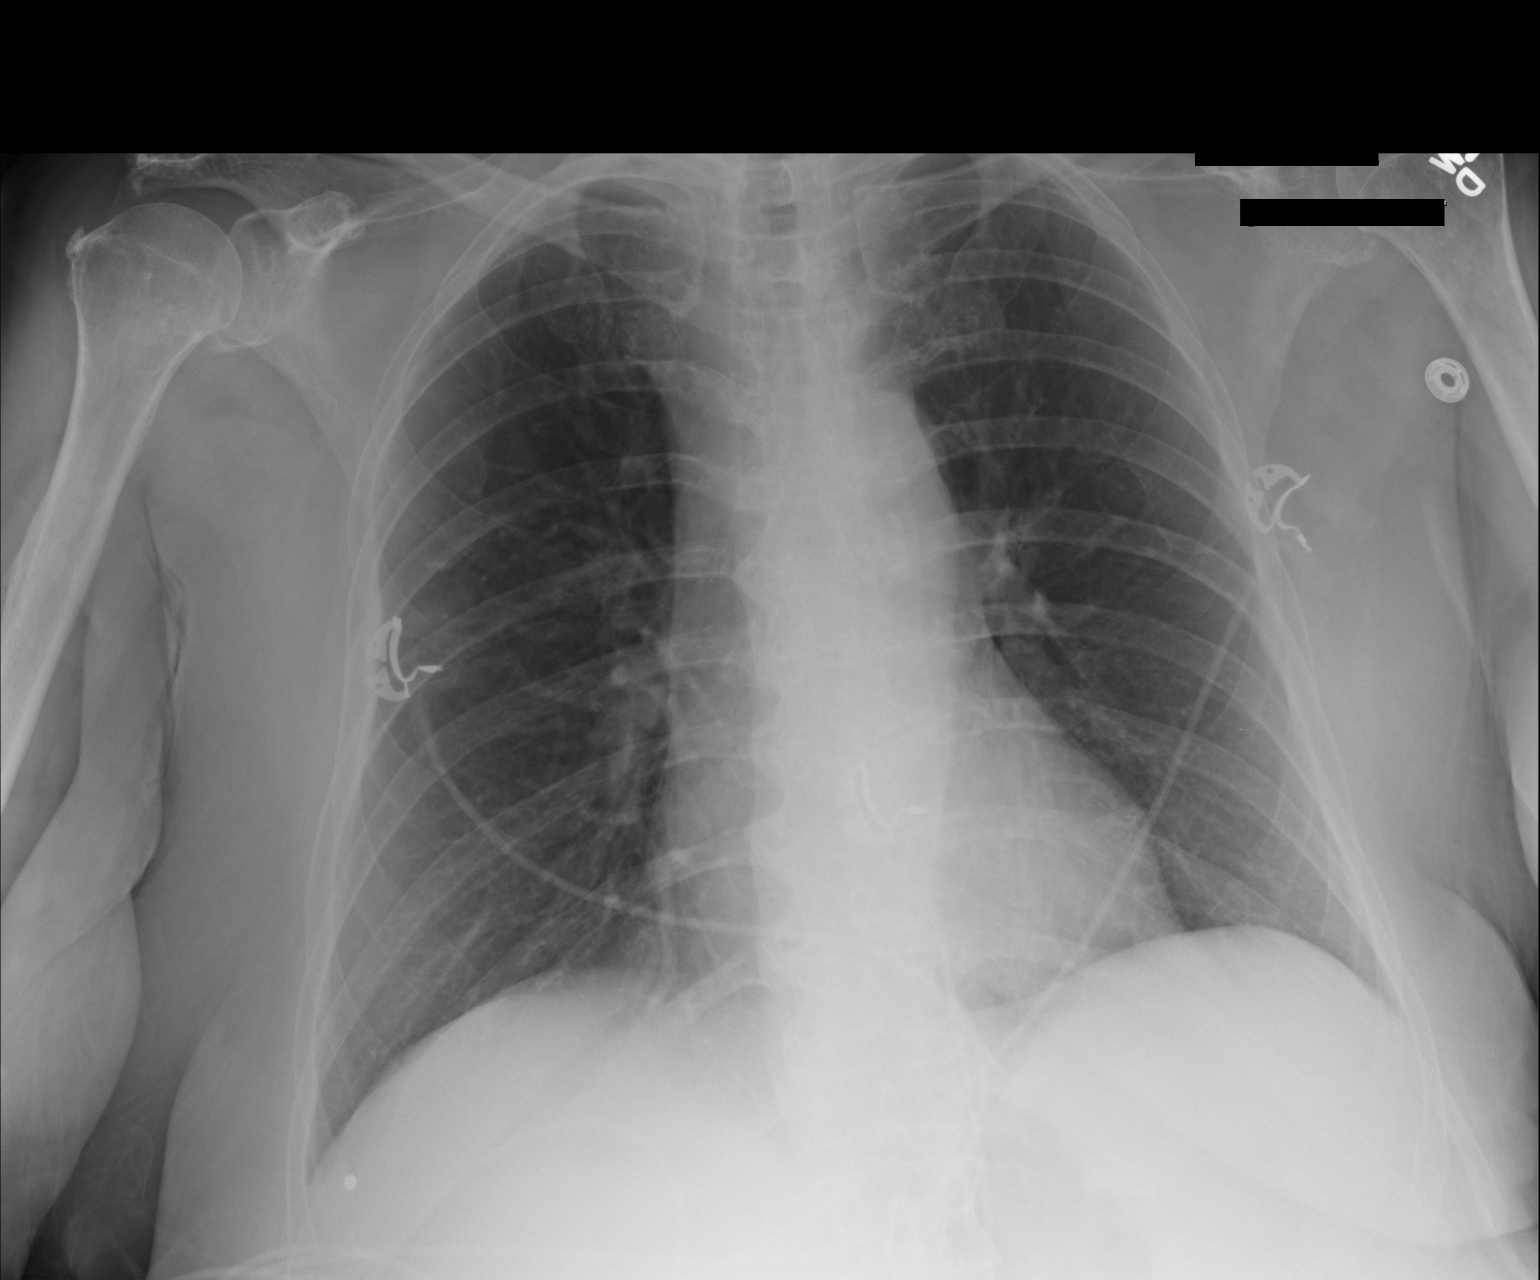

[1 of 1 positions shown; findings below may reference images not displayed]

FINDINGS: Lungs are well inflated without consolidation or effusion.
Cardiomediastinal silhouette is within normal. There is minimal
calcified plaque over the aortic arch. There are mild degenerative
changes of the spine. Calcific density in the expected region of the
supraspinatus tendon insertion bilaterally left worse than right
which may be due to calcific tendinitis.
IMPRESSION: No acute cardiopulmonary disease.

## 2017-05-01 NOTE — Telephone Encounter (Signed)
Noted  

## 2017-05-02 ENCOUNTER — Telehealth: Payer: Self-pay | Admitting: Internal Medicine

## 2017-05-02 NOTE — Telephone Encounter (Signed)
[phone] PLEASE CALL PATIENT IF HER LABS ARE COMPLETE, SHE WANTED TO KNOW RESULTS

## 2017-05-02 NOTE — Telephone Encounter (Signed)
Pt is calling back with her results. Pt is very concerned with her liver.

## 2017-05-03 NOTE — Telephone Encounter (Signed)
Pt was notified of results

## 2017-05-03 NOTE — Telephone Encounter (Signed)
See result note.  

## 2017-05-03 NOTE — Telephone Encounter (Signed)
Liver numbers are normal! Please let patient know.

## 2017-05-03 NOTE — Telephone Encounter (Signed)
lmom for a return call.  

## 2017-05-07 ENCOUNTER — Telehealth: Payer: Self-pay | Admitting: Gastroenterology

## 2017-05-07 NOTE — Telephone Encounter (Signed)
Patient needs ov with RMR only for N/V in 3 weeks.

## 2017-05-08 ENCOUNTER — Encounter: Payer: Self-pay | Admitting: Internal Medicine

## 2017-05-08 NOTE — Telephone Encounter (Signed)
appt made and letter sent  °

## 2017-05-22 ENCOUNTER — Telehealth: Payer: Self-pay
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
## 2017-05-23 ENCOUNTER — Ambulatory Visit: Payer: Medicare Other | Admitting: Internal Medicine

## 2017-05-24 NOTE — Telephone Encounter (Signed)
PATIENT RESCHEDULED AND AWARE OF NEW APPOINTMENT

## 2017-07-04 ENCOUNTER — Encounter: Payer: Self-pay | Admitting: Internal Medicine

## 2017-07-04 ENCOUNTER — Ambulatory Visit (INDEPENDENT_AMBULATORY_CARE_PROVIDER_SITE_OTHER): Payer: Medicare Other | Admitting: Internal Medicine

## 2017-07-04 VITALS — BP 123/75 | HR 79 | Temp 97.8°F | Ht 63.0 in | Wt 163.2 lb

## 2017-07-04 DIAGNOSIS — R739 Hyperglycemia, unspecified: Secondary | ICD-10-CM | POA: Diagnosis not present

## 2017-07-04 NOTE — Patient Instructions (Signed)
Increase Reglan to 10 mg twice daily (watch for side effects)  Stop prilosec; trial of Dexilant 60 mg daily x 3 weeks - samples  HGB a1c, TTG IgA, total IGA level  Information on a gastroparesis diet provided  We will retrieve colonoscopy report from [facility]  Further recommendations to follow  Office visit in 6 weeks

## 2017-07-04 NOTE — Progress Notes (Signed)
Primary Care Physician:  Monico Blitz, MD Primary Gastroenterologist:  Dr. [name]  Pre-Procedure History & Physical: HPI:  Jenna Maldonado is a 68 y.o. female here for follow-up of gastroparesis. Missed her December appointment because of bad weather. Takes Reglan 5 mg twice daily. Hemoglobin A1c?? 7 range in the distant past. Chronic intermittent nausea. Intolerant to Zofran per family report. No side effects with Reglan 5 mg twice a day so far. Phenergan has not helped her nausea. One week of nonbloody watery diarrhea. No fever. No sick contacts. No one else sick at home.   No bleeding. Colonoscopy several years ago at [facility] are unavailable. Exacerbation of reflux lately.  No dysphagia.   Past Medical History:  Diagnosis Date  . 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 Calculus of kidney    L inferior kidney ; non obstructing  . Chronic constipation   . Chronic headaches   . Depression   . Diabetic autonomic neuropathy ([place])   . Diabetic neuropathy ([place])   . Esophageal reflux   . Hypothyroidism, adult   . Orthostatic hypotension   . Residual foreign body in soft tissue   . 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 Shortness of breath     Past Surgical History:  Procedure Laterality Date  . carpel tunnel    . CHOLECYSTECTOMY  11/2014   June 2016  . Eye Surgrey Bilateral    4-5 times for macular degeneration  . HAND SURGERY Bilateral 2001  . SHOULDER SURGERY Left   . TUBAL LIGATION      Prior to Admission medications   Medication Sig Start Date End Date Taking? Authorizing Provider  aspirin EC 81 MG tablet Take 81 mg by mouth daily.   Yes [provider]  atorvastatin (LIPITOR) 80 MG tablet Take 1 tablet (80 mg total) by mouth daily. 03/08/17  Yes Patel, Donika K, DO  carbamazepine (TEGRETOL XR) 100 MG 12 hr tablet Take 300 mg by mouth 2 (two) times daily.    Yes [provider]  cyanocobalamin (,VITAMIN B-12,) 1000  MCG/ML injection INJECT 1ML INTO MUSCLE DAILY X 7 DAYS, THEN 1 ML ONCE WEEKLY X 4 WEEKS, THEN 1 ML MONTHLY 03/10/17  Yes [provider]  fludrocortisone (FLORINEF) 0.1 MG tablet Take 2 tablets (0.2 mg total) by mouth daily. 10/24/15  Yes Ahmed, Chesley Mires, MD  FLUoxetine (PROZAC) 40 MG capsule Take 40 mg by mouth daily.  12/30/12  Yes [provider]  insulin degludec (TRESIBA FLEXTOUCH) 100 UNIT/ML SOPN FlexTouch Pen Inject 25 Units into the skin at bedtime.    Yes [provider]  levothyroxine (SYNTHROID, LEVOTHROID) 50 MCG tablet Take 50 mcg by mouth daily before breakfast.   Yes [provider]  metFORMIN (GLUCOPHAGE) 500 MG tablet Take 500 mg by mouth 2 (two) times daily with a meal.    Yes [provider]  metoCLOPramide (REGLAN) 5 MG tablet Take 5 mg by mouth 3 (three) times daily.  02/06/17  Yes [provider]  omeprazole (PRILOSEC) 20 MG capsule Take 20 mg by mouth daily.  10/11/12  Yes [provider]  promethazine (PHENERGAN) 25 MG tablet TAKE ONE-HALF TO ONE TABLET BY MOUTH EVERY 6 HOURS AS NEEDED 02/16/17  Yes [provider]  senna (SENOKOT) 8.6 MG tablet Take 1 tablet by mouth daily. As needed 12/22/15  Yes [provider]  DUREZOL 0.05 % EMUL 2 (two) times daily. 03/21/17   [provider]  NYSTATIN powder as needed.  07/12/16   [provider]  ofloxacin (OCUFLOX) 0.3 % ophthalmic solution as directed. 03/21/17   [provider]  polyethylene glycol (MIRALAX / GLYCOLAX) packet Take 17 g by mouth daily. Patient not taking: Reported on 07/04/2017 10/24/15   [name], MD    Allergies as of 07/04/2017 - Review Complete 07/04/2017  Allergen Reaction Noted  . Atorvastatin Other (See Comments) 10/14/2015  . Codeine Nausea And Vomiting 01/02/2013  . Flagyl [metronidazole] Itching 10/14/2015  . Hydrocodone Nausea And Vomiting 10/14/2015  . Pioglitazone Other (See Comments) 10/14/2015  .  Sulfa antibiotics Other (See Comments) 10/14/2015  . Victoza [liraglutide]  04/03/2017  . Ciprofloxacin Itching and Rash 10/14/2015    Family History  Problem Relation Age of Onset  . Cancer Mother   . Diabetes Mother   . Suicidality Father        GSW  . Diabetes Sister        3 sisters  . Heart disease Brother        MI  . Diabetes Brother        2 brothers  . Stroke Neg Hx     Social History   Socioeconomic History  . Marital status: Married    Spouse name: Laverna Peace  . Number of children: 2  . Years of education: 58  . Highest education level: Not on file  Social Needs  . Financial resource strain: Not on file  . Food insecurity - worry: Not on file  . Food insecurity - inability: Not on file  . Transportation needs - medical: Not on file  . Transportation needs - non-medical: Not on file  Occupational History  . Occupation: N/A  Tobacco Use  . Smoking status: Never Smoker  . Smokeless tobacco: Never Used  Substance and Sexual Activity  . Alcohol use: No  . Drug use: No  . Sexual activity: Not on file  Other Topics Concern  . Not on file  Social History Narrative   01/15/16 Patient lives with daughter, Joseph Art   Caffeine Use: occasionally    Review of Systems: See HPI, otherwise negative ROS  Physical Exam: BP 123/75   Pulse 79   Temp 97.8 F (36.6 C) (Oral)   Ht 5\' 3"  (1.6 m)   Wt 163 lb 3.2 oz (74 kg)   BMI 28.91 kg/m  General:   Alert,   pleasant and cooperative in NAD Neck:  Supple; no masses or thyromegaly. No significant cervical adenopathy. Lungs:  Clear throughout to auscultation.   No wheezes, crackles, or rhonchi. No acute distress. Heart:  Regular rate and rhythm; no murmurs, clicks, rubs,  or gallops. Abdomen: Non-distended, normal bowel sounds.  Soft and nontender without appreciable mass or hepatosplenomegaly. No succussion splash. Pulses:  Normal pulses noted. Extremities:  Without clubbing or edema.  Impression: 67 year old lady with  long-standing diabetes gastroparesis  -  proven by gastric empty study previously. One week exacerbation with associated diarrhea. May have had a recent viral syndrome. Tolerating low-dose Reglan. Blood glucose may have been under recently poor control. Reflux also exacerbated. No other alarm symptoms.  Her weight loss is concerning, however.  Recommendations: Increase Reglan to 10 mg twice daily (watch for side effects)  Stop prilosec; trial of Dexilant 60 mg daily x 3 weeks - samples  HGB a1c, TTG IgA, total IGA level  Information on a gastroparesis diet provided  We will retrieve colonoscopy report from [facility]  Further recommendations to  follow  Office visit in 6 weeks   Notice: This dictation was prepared with Dragon dictation along with smaller phrase technology. Any transcriptional errors that result from this process are unintentional and may not be corrected upon review.

## 2017-07-05 LAB — TISSUE TRANSGLUTAMINASE, IGA: (tTG) Ab, IgA: 1 U/mL

## 2017-07-05 LAB — HEMOGLOBIN A1C
HEMOGLOBIN A1C: 6.1 %{Hb} — AB (ref <5.7)
MEAN PLASMA GLUCOSE: 128 (calc)
eAG (mmol/L): 7.1 (calc)

## 2017-07-05 LAB — IGA: Immunoglobulin A: 199 mg/dL (ref 81–463)

## 2017-07-10 ENCOUNTER — Telehealth: Payer: Self-pay | Admitting: Internal Medicine

## 2017-07-10 ENCOUNTER — Other Ambulatory Visit: Payer: Self-pay

## 2017-07-10 DIAGNOSIS — R197 Diarrhea, unspecified: Secondary | ICD-10-CM

## 2017-07-10 NOTE — Telephone Encounter (Signed)
[phone] PATIENT NIECE CALLED AND WANTS TO KNOW IF WE HAVE ANY MORE SAMPLES OF THE MEDICATION SHE WAS GIVEN LAST WEEK.  ALSO WANTS [place] TO KNOW THAT SHE IS WORSE THAN SHE WAS AND IS HAVING AWFUL DIARRHEA

## 2017-07-10 NOTE — Telephone Encounter (Signed)
Spoke with pts niece. Pt isn't able to keep food down as discussed at her last apt with RMR last week. When pt eats, it either comes back up or comes out with diarrhea. Pt diarrhea is full of mucous per pts niece. No pain, c/o severe nausea.   Will contact pt when Dexilant samples are in. Pt was given one box last week and finished it Friday 07/07/17. Please answer in the absence of LSL.

## 2017-07-10 NOTE — Telephone Encounter (Signed)
Orders released to [facility]

## 2017-07-10 NOTE — Telephone Encounter (Signed)
Celiac serologies negative. Needs to increase Reglan to 10 mg BID as stated at office visit, so let's make sure she is doing this.   Should go ahead and collect stool studies: Cdiff, stool culture, giardia. Would sip on liquids, advance slowly to soft foods as tolerated. She needs to take a PPI, so if she doesn't have Dexilant (as we don't have samples right now), resume Prilosec once daily for now.

## 2017-07-10 NOTE — Telephone Encounter (Signed)
pts daughter notified. Orders put in for [facility] per pts request.

## 2017-07-12 ENCOUNTER — Telehealth: Payer: Self-pay

## 2017-07-12 NOTE — Telephone Encounter (Signed)
LMOM, Dexilant 60 mg samples are available and ready for pickup.

## 2017-07-20 ENCOUNTER — Encounter (HOSPITAL_COMMUNITY): Payer: Self-pay | Admitting: Emergency Medicine

## 2017-07-20 ENCOUNTER — Observation Stay (HOSPITAL_COMMUNITY)
Admission: EM | Admit: 2017-07-20 | Discharge: 2017-07-22 | Disposition: A | Discharge status: Home / Self Care | Payer: Medicare Other | Attending: Internal Medicine | Admitting: Internal Medicine

## 2017-07-20 ENCOUNTER — Other Ambulatory Visit: Payer: Self-pay

## 2017-07-20 DIAGNOSIS — M791 Myalgia, unspecified site: Secondary | ICD-10-CM | POA: Diagnosis not present

## 2017-07-20 DIAGNOSIS — R296 Repeated falls: Secondary | ICD-10-CM | POA: Diagnosis not present

## 2017-07-20 DIAGNOSIS — E876 Hypokalemia: Principal | ICD-10-CM | POA: Insufficient documentation

## 2017-07-20 DIAGNOSIS — Z79899 Other long term (current) drug therapy: Secondary | ICD-10-CM | POA: Diagnosis not present

## 2017-07-20 DIAGNOSIS — R42 Dizziness and giddiness: Secondary | ICD-10-CM | POA: Insufficient documentation

## 2017-07-20 DIAGNOSIS — Z794 Long term (current) use of insulin: Secondary | ICD-10-CM | POA: Diagnosis not present

## 2017-07-20 DIAGNOSIS — K529 Noninfective gastroenteritis and colitis, unspecified: Secondary | ICD-10-CM | POA: Diagnosis present

## 2017-07-20 DIAGNOSIS — R0781 Pleurodynia: Secondary | ICD-10-CM

## 2017-07-20 DIAGNOSIS — E1143 Type 2 diabetes mellitus with diabetic autonomic (poly)neuropathy: Secondary | ICD-10-CM | POA: Diagnosis present

## 2017-07-20 DIAGNOSIS — R112 Nausea with vomiting, unspecified: Secondary | ICD-10-CM | POA: Insufficient documentation

## 2017-07-20 DIAGNOSIS — E039 Hypothyroidism, unspecified: Secondary | ICD-10-CM | POA: Diagnosis not present

## 2017-07-20 DIAGNOSIS — R1110 Vomiting, unspecified: Secondary | ICD-10-CM | POA: Diagnosis present

## 2017-07-20 DIAGNOSIS — E1169 Type 2 diabetes mellitus with other specified complication: Secondary | ICD-10-CM | POA: Diagnosis present

## 2017-07-20 DIAGNOSIS — K3184 Gastroparesis: Secondary | ICD-10-CM

## 2017-07-20 DIAGNOSIS — R197 Diarrhea, unspecified: Secondary | ICD-10-CM | POA: Insufficient documentation

## 2017-07-20 DIAGNOSIS — E1140 Type 2 diabetes mellitus with diabetic neuropathy, unspecified: Secondary | ICD-10-CM | POA: Insufficient documentation

## 2017-07-20 DIAGNOSIS — Z7982 Long term (current) use of aspirin: Secondary | ICD-10-CM | POA: Diagnosis not present

## 2017-07-20 DIAGNOSIS — R109 Unspecified abdominal pain: Secondary | ICD-10-CM | POA: Diagnosis not present

## 2017-07-20 DIAGNOSIS — E785 Hyperlipidemia, unspecified: Secondary | ICD-10-CM | POA: Diagnosis present

## 2017-07-20 LAB — CBC WITH DIFFERENTIAL/PLATELET
BASOS PCT: 0 %
Basophils Absolute: 0 10*3/uL (ref 0.0–0.1)
EOS PCT: 1 %
Eosinophils Absolute: 0.1 10*3/uL (ref 0.0–0.7)
HEMATOCRIT: 39.7 % (ref 36.0–46.0)
Hemoglobin: 13.6 g/dL (ref 12.0–15.0)
Lymphocytes Relative: 36 %
Lymphs Abs: 3.5 10*3/uL (ref 0.7–4.0)
MCH: 29.1 pg (ref 26.0–34.0)
MCHC: 34.3 g/dL (ref 30.0–36.0)
MCV: 84.8 fL (ref 78.0–100.0)
MONO ABS: 0.7 10*3/uL (ref 0.1–1.0)
Monocytes Relative: 7 %
NEUTROS ABS: 5.4 10*3/uL (ref 1.7–7.7)
Neutrophils Relative %: 56 %
Platelets: 190 10*3/uL (ref 150–400)
RBC: 4.68 MIL/uL (ref 3.87–5.11)
RDW: 12.6 % (ref 11.5–15.5)
WBC: 9.6 10*3/uL (ref 4.0–10.5)

## 2017-07-20 LAB — COMPREHENSIVE METABOLIC PANEL
ALBUMIN: 3.7 g/dL (ref 3.5–5.0)
ALT: 28 U/L (ref 14–54)
ANION GAP: 17 — AB (ref 5–15)
AST: 31 U/L (ref 15–41)
Alkaline Phosphatase: 88 U/L (ref 38–126)
BILIRUBIN TOTAL: 0.9 mg/dL (ref 0.3–1.2)
BUN: 13 mg/dL (ref 6–20)
CO2: 30 mmol/L (ref 22–32)
Calcium: 7.3 mg/dL — ABNORMAL LOW (ref 8.9–10.3)
Chloride: 92 mmol/L — ABNORMAL LOW (ref 101–111)
Creatinine, Ser: 0.61 mg/dL (ref 0.44–1.00)
GFR calc Af Amer: >60 mL/min (ref >60)
Glucose, Bld: 128 mg/dL — ABNORMAL HIGH (ref 65–99)
POTASSIUM: 2.3 mmol/L — AB (ref 3.5–5.1)
Sodium: 139 mmol/L (ref 135–145)
TOTAL PROTEIN: 7.1 g/dL (ref 6.5–8.1)

## 2017-07-20 LAB — LIPASE, BLOOD: LIPASE: 26 U/L (ref 11–51)

## 2017-07-20 NOTE — ED Triage Notes (Signed)
Pt states she has had vomiting and diarrhea for the past 2 years. She states she is seeing Dr. [name] for these concerns. States she feels like she is dehydrated and weak. Pt states she also fell and C/O rib pain.

## 2017-07-20 NOTE — ED Notes (Signed)
Date and time results received: 07/20/17 11:31 PM Test: potassium Critical Value: 2.3 Name of Provider Notified: dr.[email or web address] Orders Received? Or Actions Taken?: Md notified.

## 2017-07-21 ENCOUNTER — Emergency Department (HOSPITAL_COMMUNITY): Payer: Medicare Other

## 2017-07-21 DIAGNOSIS — R0781 Pleurodynia: Secondary | ICD-10-CM | POA: Diagnosis not present

## 2017-07-21 DIAGNOSIS — E1143 Type 2 diabetes mellitus with diabetic autonomic (poly)neuropathy: Secondary | ICD-10-CM | POA: Diagnosis not present

## 2017-07-21 DIAGNOSIS — E876 Hypokalemia: Secondary | ICD-10-CM | POA: Diagnosis not present

## 2017-07-21 DIAGNOSIS — R112 Nausea with vomiting, unspecified: Secondary | ICD-10-CM

## 2017-07-21 DIAGNOSIS — K3184 Gastroparesis: Secondary | ICD-10-CM | POA: Diagnosis not present

## 2017-07-21 DIAGNOSIS — R1110 Vomiting, unspecified: Secondary | ICD-10-CM | POA: Diagnosis present

## 2017-07-21 DIAGNOSIS — R197 Diarrhea, unspecified: Secondary | ICD-10-CM | POA: Diagnosis not present

## 2017-07-21 LAB — GLUCOSE, CAPILLARY
Glucose-Capillary: 98 mg/dL (ref 65–99)
Glucose-Capillary: 95 mg/dL (ref 65–99)

## 2017-07-21 LAB — BASIC METABOLIC PANEL
ANION GAP: 13 (ref 5–15)
BUN: 12 mg/dL (ref 6–20)
CALCIUM: 7.1 mg/dL — AB (ref 8.9–10.3)
CO2: 30 mmol/L (ref 22–32)
CREATININE: 0.58 mg/dL (ref 0.44–1.00)
Chloride: 96 mmol/L — ABNORMAL LOW (ref 101–111)
Glucose, Bld: 111 mg/dL — ABNORMAL HIGH (ref 65–99)
Potassium: 2.8 mmol/L — ABNORMAL LOW (ref 3.5–5.1)
SODIUM: 139 mmol/L (ref 135–145)

## 2017-07-21 LAB — URINALYSIS, ROUTINE W REFLEX MICROSCOPIC
Bilirubin Urine: NEGATIVE
GLUCOSE, UA: NEGATIVE mg/dL
HGB URINE DIPSTICK: NEGATIVE
Nitrite: NEGATIVE
PH: 6.5 (ref 5.0–8.0)
PROTEIN: NEGATIVE mg/dL
Specific Gravity, Urine: <1.005 — ABNORMAL LOW (ref 1.005–1.030)

## 2017-07-21 LAB — HEMOGLOBIN A1C
HEMOGLOBIN A1C: 6 % — AB (ref 4.8–5.6)
MEAN PLASMA GLUCOSE: 125.5 mg/dL

## 2017-07-21 LAB — OVA AND PARASITE EXAMINATION

## 2017-07-21 LAB — URINALYSIS, MICROSCOPIC (REFLEX): RBC / HPF: NONE SEEN RBC/hpf (ref 0–5)

## 2017-07-21 LAB — CLOSTRIDIUM DIFFICILE EIA: C DIFFICILE TOXINS A+ B, EIA: NEGATIVE

## 2017-07-21 LAB — MAGNESIUM
Magnesium: 0.9 mg/dL — CL (ref 1.7–2.4)
Magnesium: 1.6 mg/dL — ABNORMAL LOW (ref 1.7–2.4)

## 2017-07-21 MED ORDER — LEVOTHYROXINE SODIUM 50 MCG PO TABS
50.0000 ug | ORAL_TABLET | Freq: Every day | ORAL | Status: DC
Start: 2017-07-21 — End: 2017-07-22
  Administered 2017-07-21 – 2017-07-22 (×2): 50 ug via ORAL
  Filled 2017-07-21 (×2): qty 1

## 2017-07-21 MED ORDER — FLUDROCORTISONE ACETATE 0.1 MG PO TABS
0.2000 mg | ORAL_TABLET | Freq: Every day | ORAL | Status: DC
  Administered 2017-07-21 – 2017-07-22 (×2): 0.2 mg via ORAL
  Filled 2017-07-21 (×4): qty 2

## 2017-07-21 MED ORDER — ONDANSETRON HCL 4 MG PO TABS: 4.0000 mg | ORAL_TABLET | Freq: Four times a day (QID) | ORAL | Status: DC | PRN

## 2017-07-21 MED ORDER — MAGNESIUM SULFATE 2 GM/50ML IV SOLN
2.0000 g | Freq: Once | INTRAVENOUS | Status: AC
  Administered 2017-07-21: 2 g via INTRAVENOUS
  Filled 2017-07-21 (×2): qty 50

## 2017-07-21 MED ORDER — POTASSIUM CHLORIDE 10 MEQ/100ML IV SOLN
10.0000 meq | INTRAVENOUS | Status: AC
  Administered 2017-07-21 (×3): 10 meq via INTRAVENOUS
  Filled 2017-07-21 (×3): qty 100

## 2017-07-21 MED ORDER — ENOXAPARIN SODIUM 40 MG/0.4ML ~~LOC~~ SOLN
40.0000 mg | SUBCUTANEOUS | Status: DC
  Administered 2017-07-21 – 2017-07-22 (×2): 40 mg via SUBCUTANEOUS
  Filled 2017-07-21 (×2): qty 0.4

## 2017-07-21 MED ORDER — ASPIRIN EC 81 MG PO TBEC
81.0000 mg | DELAYED_RELEASE_TABLET | Freq: Every day | ORAL | Status: DC
  Administered 2017-07-21 – 2017-07-22 (×2): 81 mg via ORAL
  Filled 2017-07-21 (×2): qty 1

## 2017-07-21 MED ORDER — POTASSIUM CHLORIDE CRYS ER 20 MEQ PO TBCR
40.0000 meq | EXTENDED_RELEASE_TABLET | Freq: Once | ORAL | Status: AC
  Administered 2017-07-21: 40 meq via ORAL
  Filled 2017-07-21: qty 2

## 2017-07-21 MED ORDER — FLUOXETINE HCL 20 MG PO CAPS
40.0000 mg | ORAL_CAPSULE | Freq: Every day | ORAL | Status: DC
  Administered 2017-07-21 – 2017-07-22 (×2): 40 mg via ORAL
  Filled 2017-07-21 (×3): qty 2

## 2017-07-21 MED ORDER — SODIUM CHLORIDE 0.9 % IV BOLUS (SEPSIS)
1000.0000 mL | Freq: Once | INTRAVENOUS | Status: AC
  Administered 2017-07-21: 1000 mL via INTRAVENOUS

## 2017-07-21 MED ORDER — ACETAMINOPHEN 325 MG PO TABS: 650.0000 mg | ORAL_TABLET | Freq: Four times a day (QID) | ORAL | Status: DC | PRN

## 2017-07-21 MED ORDER — ONDANSETRON HCL 4 MG/2ML IJ SOLN: 4.0000 mg | Freq: Four times a day (QID) | INTRAMUSCULAR | Status: DC | PRN

## 2017-07-21 MED ORDER — PANTOPRAZOLE SODIUM 40 MG PO TBEC
40.0000 mg | DELAYED_RELEASE_TABLET | Freq: Every day | ORAL | Status: DC
  Administered 2017-07-21 – 2017-07-22 (×2): 40 mg via ORAL
  Filled 2017-07-21 (×2): qty 1

## 2017-07-21 MED ORDER — POTASSIUM CHLORIDE 10 MEQ/100ML IV SOLN
10.0000 meq | INTRAVENOUS | Status: AC
  Administered 2017-07-21 (×4): 10 meq via INTRAVENOUS
  Filled 2017-07-21 (×4): qty 100

## 2017-07-21 MED ORDER — INSULIN GLARGINE 100 UNIT/ML ~~LOC~~ SOLN
25.0000 [IU] | Freq: Every day | SUBCUTANEOUS | Status: DC
  Administered 2017-07-21: 25 [IU] via SUBCUTANEOUS
  Filled 2017-07-21 (×3): qty 0.25

## 2017-07-21 MED ORDER — METOCLOPRAMIDE HCL 5 MG/ML IJ SOLN
5.0000 mg | Freq: Three times a day (TID) | INTRAMUSCULAR | Status: DC
  Administered 2017-07-21 – 2017-07-22 (×5): 5 mg via INTRAVENOUS
  Filled 2017-07-21 (×5): qty 2

## 2017-07-21 MED ORDER — SODIUM CHLORIDE 0.9 % IV SOLN
INTRAVENOUS | Status: DC
  Administered 2017-07-21 – 2017-07-22 (×3): via INTRAVENOUS

## 2017-07-21 MED ORDER — CARBAMAZEPINE ER 100 MG PO TB12
300.0000 mg | ORAL_TABLET | Freq: Two times a day (BID) | ORAL | Status: DC
  Administered 2017-07-21 – 2017-07-22 (×3): 300 mg via ORAL
  Filled 2017-07-21 (×7): qty 3

## 2017-07-21 MED ORDER — MAGNESIUM SULFATE 2 GM/50ML IV SOLN
2.0000 g | Freq: Once | INTRAVENOUS | Status: AC
  Administered 2017-07-21: 2 g via INTRAVENOUS
  Filled 2017-07-21: qty 50

## 2017-07-21 MED ORDER — INSULIN ASPART 100 UNIT/ML ~~LOC~~ SOLN
0.0000 [IU] | Freq: Three times a day (TID) | SUBCUTANEOUS | Status: DC
  Administered 2017-07-22: 2 [IU] via SUBCUTANEOUS

## 2017-07-21 MED ORDER — ACETAMINOPHEN 650 MG RE SUPP: 650.0000 mg | Freq: Four times a day (QID) | RECTAL | Status: DC | PRN

## 2017-07-21 MED ORDER — POTASSIUM CHLORIDE CRYS ER 20 MEQ PO TBCR
40.0000 meq | EXTENDED_RELEASE_TABLET | ORAL | Status: AC
  Administered 2017-07-21 (×2): 40 meq via ORAL
  Filled 2017-07-21 (×2): qty 2

## 2017-07-21 MED ORDER — ATORVASTATIN CALCIUM 40 MG PO TABS
80.0000 mg | ORAL_TABLET | Freq: Every day | ORAL | Status: DC
  Administered 2017-07-22: 80 mg via ORAL
  Filled 2017-07-21: qty 2

## 2017-07-21 MED ORDER — CARBAMAZEPINE ER 200 MG PO TB12
300.0000 mg | ORAL_TABLET | Freq: Two times a day (BID) | ORAL | Status: DC
  Filled 2017-07-21: qty 1

## 2017-07-21 MED ORDER — ONDANSETRON HCL 4 MG/2ML IJ SOLN
4.0000 mg | Freq: Once | INTRAMUSCULAR | Status: AC
  Administered 2017-07-21: 4 mg via INTRAVENOUS
  Filled 2017-07-21: qty 2

## 2017-07-21 NOTE — ED Provider Notes (Signed)
[facility] EMERGENCY DEPARTMENT Provider Note   CSN: [id number] Arrival date & time: 07/20/17  1913     History   Chief Complaint Chief Complaint  Patient presents with  . Emesis    HPI Jenna Maldonado is a 67 y.o. female.  Patient states she has been having vomiting and diarrhea for the past "2 years".  Symptoms have been progressively worsening over the past 1 week.  She vomits every day in the morning.  She was diagnosed by Dr. [name] with gastroparesis.  States she vomited once this morning and once every morning for the past week.  She had diarrhea last week which has since resolved.  She has had 3 falls within the past week due to dizziness and lightheadedness.  She has bruising to her right eye and right ribs.  Denies any pain with urination or blood in the urine.  Denies any vaginal bleeding or vaginal discharge.  States she came in today because she is tired of feeling sick.  Patient has a history of orthostatic hypotension and esophageal reflux and diabetes.   The history is provided by the patient.  Emesis   Associated symptoms include abdominal pain, arthralgias and myalgias. Pertinent negatives include no cough, no fever and no headaches.    Past Medical History:  Diagnosis Date  . 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 Calculus of kidney    L inferior kidney ; non obstructing  . Chronic constipation   . Chronic headaches   . Depression   . Diabetic autonomic neuropathy ([place])   . Diabetic neuropathy ([place])   . Esophageal reflux   . Hypothyroidism, adult   . Orthostatic hypotension   . Residual foreign body in soft tissue   . 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 Shortness of breath     Patient Active Problem List   Diagnosis Date Noted  . Gastroparesis due to DM ([place]) 04/03/2017  . Hyperglycemia 11/17/2015  . Renal calculus, left 10/30/2015  . Syncope 10/27/2015  . Macular degeneration 10/27/2015  . Obstructive sleep apnea  10/27/2015  . Hypokalemia 10/23/2015  . Constipation   . Morning headache   . Orthostatic hypotension 10/14/2015  . Adrenal mass, left ([place]) 10/14/2015  . RBBB 10/14/2015  . Emesis   . Right facial pain 01/02/2013  . Diabetic neuropathy ([place]) 01/02/2013    Past Surgical History:  Procedure Laterality Date  . carpel tunnel    . CHOLECYSTECTOMY  11/2014   June 2016  . Eye Surgrey Bilateral    4-5 times for macular degeneration  . HAND SURGERY Bilateral 2001  . SHOULDER SURGERY Left   . TUBAL LIGATION      OB History    No data available       Home Medications    Prior to Admission medications   Medication Sig Start Date End Date Taking? Authorizing Provider  aspirin EC 81 MG tablet Take 81 mg by mouth daily.    [provider]  atorvastatin (LIPITOR) 80 MG tablet Take 1 tablet (80 mg total) by mouth daily. 03/08/17   Narda Amber K, DO  carbamazepine (TEGRETOL XR) 100 MG 12 hr tablet Take 300 mg by mouth 2 (two) times daily.     [provider]  cyanocobalamin (,VITAMIN B-12,) 1000 MCG/ML injection INJECT 1ML INTO MUSCLE DAILY X 7 DAYS, THEN 1 ML ONCE WEEKLY X 4 WEEKS, THEN 1 ML MONTHLY 03/10/17   [provider]  DUREZOL 0.05 % EMUL 2 (two) times daily. 03/21/17   [provider]  fludrocortisone (FLORINEF) 0.1 MG tablet Take 2 tablets (0.2 mg total) by mouth daily. 10/24/15   [name], MD  FLUoxetine (PROZAC) 40 MG capsule Take 40 mg by mouth daily.  12/30/12   [provider]  insulin degludec (TRESIBA FLEXTOUCH) 100 UNIT/ML SOPN FlexTouch Pen Inject 25 Units into the skin at bedtime.     [provider]  levothyroxine (SYNTHROID, LEVOTHROID) 50 MCG tablet Take 50 mcg by mouth daily before breakfast.    [provider]  metFORMIN (GLUCOPHAGE) 500 MG tablet Take 500 mg by mouth 2 (two) times daily with a meal.     [provider]  metoCLOPramide (REGLAN) 5 MG tablet Take 5 mg by mouth 3 (three) times  daily.  02/06/17   [provider]  NYSTATIN powder as needed.  07/12/16   [provider]  ofloxacin (OCUFLOX) 0.3 % ophthalmic solution as directed. 03/21/17   [provider]  omeprazole (PRILOSEC) 20 MG capsule Take 20 mg by mouth daily.  10/11/12   [provider]  polyethylene glycol (MIRALAX / GLYCOLAX) packet Take 17 g by mouth daily. Patient not taking: Reported on 07/04/2017 10/24/15   [name], MD  promethazine (PHENERGAN) 25 MG tablet TAKE ONE-HALF TO ONE TABLET BY MOUTH EVERY 6 HOURS AS NEEDED 02/16/17   [provider]  senna (SENOKOT) 8.6 MG tablet Take 1 tablet by mouth daily. As needed 12/22/15   [provider]    Family History Family History  Problem Relation Age of Onset  . Cancer Mother   . Diabetes Mother   . Suicidality Father        GSW  . Diabetes Sister        3 sisters  . Heart disease Brother        MI  . Diabetes Brother        2 brothers  . Stroke Neg Hx     Social History Social History   Tobacco Use  . Smoking status: Never Smoker  . Smokeless tobacco: Never Used  Substance Use Topics  . Alcohol use: No  . Drug use: No     Allergies   Atorvastatin; Codeine; Flagyl [metronidazole]; Hydrocodone; Pioglitazone; Sulfa antibiotics; Victoza [liraglutide]; and Ciprofloxacin   Review of Systems Review of Systems  Constitutional: Positive for activity change, appetite change and fatigue. Negative for fever.  HENT: Negative for congestion and rhinorrhea.   Eyes: Negative for visual disturbance.  Respiratory: Negative for cough, chest tightness and shortness of breath.   Cardiovascular: Negative for chest pain.  Gastrointestinal: Positive for abdominal pain, nausea and vomiting.  Genitourinary: Negative for dysuria, vaginal bleeding and vaginal discharge.  Musculoskeletal: Positive for arthralgias and myalgias.  Neurological: Negative for dizziness, weakness and headaches.    all other systems  are negative except as noted in the HPI and PMH.    Physical Exam Updated Vital Signs BP 138/69 (BP Location: Left Arm)   Pulse 92   Temp 99 F (37.2 C) (Oral)   Resp 20   Ht 5\' 3"  (1.6 m)   Wt 70.4 kg (155 lb 4.8 oz)   SpO2 97%   BMI 27.51 kg/m   Physical Exam  Constitutional: She is oriented to person, place, and time. She appears well-developed and well-nourished. No distress.  HENT:  Head: Normocephalic and atraumatic.  Mouth/Throat: Oropharynx is clear and moist. No oropharyngeal exudate.  Right periorbital ecchymosis  Dry mucus membranes  Eyes: Conjunctivae and EOM are normal. Pupils are equal, round, and reactive to light.  Neck: Normal range of motion. Neck supple.  No meningismus.  Cardiovascular: Normal rate, regular rhythm, normal heart sounds and intact distal pulses.  No murmur heard. Pulmonary/Chest: Effort normal and breath sounds normal. No respiratory distress.  Abdominal: Soft. There is no tenderness. There is no rebound and no guarding.  Musculoskeletal: Normal range of motion. She exhibits no edema or tenderness.  Old bruising to right paraspinal thoracic area No midline tenderness  Neurological: She is alert and oriented to person, place, and time. No cranial nerve deficit. She exhibits normal muscle tone. Coordination normal.  No ataxia on finger to nose bilaterally. No pronator drift. 5/5 strength throughout. CN 2-12 intact.Equal grip strength. Sensation intact.   Skin: Skin is warm.  Psychiatric: She has a normal mood and affect. Her behavior is normal.  Nursing note and vitals reviewed.    ED Treatments / Results  Labs (all labs ordered are listed, but only abnormal results are displayed) Labs Reviewed  COMPREHENSIVE METABOLIC PANEL - Abnormal; Notable for the following components:      Result Value   Potassium 2.3 (*)    Chloride 92 (*)    Glucose, Bld 128 (*)    Calcium 7.3 (*)    Anion gap 17 (*)    All other components within normal  limits  URINALYSIS, ROUTINE W REFLEX MICROSCOPIC - Abnormal; Notable for the following components:   Specific Gravity, Urine <1.005 (*)    Ketones, ur TRACE (*)    Leukocytes, UA LARGE (*)    All other components within normal limits  MAGNESIUM - Abnormal; Notable for the following components:   Magnesium 0.9 (*)    All other components within normal limits  URINALYSIS, MICROSCOPIC (REFLEX) - Abnormal; Notable for the following components:   Bacteria, UA MANY (*)    Squamous Epithelial / LPF 0-5 (*)    All other components within normal limits  CBC WITH DIFFERENTIAL/PLATELET  LIPASE, BLOOD  HIV ANTIBODY (ROUTINE TESTING)  BASIC METABOLIC PANEL  HEMOGLOBIN A1C    EKG  EKG Interpretation  Date/Time:  Friday July 21 2017 01:02:15 EST Ventricular Rate:  84 PR Interval:    QRS Duration: 145 QT Interval:  463 QTC Calculation: 548 R Axis:   131 Text Interpretation:  Sinus rhythm RBBB and LPFB No significant change was found Confirmed by Ezequiel Essex [phone]) on 07/21/2017 1:06:32 AM       Radiology Dg Ribs Unilateral Right  Result Date: 07/21/2017 CLINICAL DATA:  Initial evaluation for acute right rib pain, recent fall. EXAM: RIGHT RIBS - 2 VIEW COMPARISON:  Prior radiograph from 11/10/2016. FINDINGS: Metallic BB marker overlies the lower right ribs at site of pain. Remotely healed fracture of the right posterior sixth and seventh ribs noted. No acute displaced rib fracture identified. Visualized right hemithorax clear. Metallic density again noted overlying the right upper quadrant. IMPRESSION: 1. No acute displaced rib fracture identified. 2. Remotely healed fractures of the right lateral sixth and seventh ribs. Electronically Signed   By: Jeannine Boga M.D.   On: 07/21/2017 04:41   Dg Abdomen Acute W/chest  Result Date: 07/21/2017 CLINICAL DATA:  Initial evaluation for acute nausea, vomiting, diarrhea. Abdominal pain. EXAM: DG ABDOMEN ACUTE W/ 1V CHEST COMPARISON:   Prior radiograph from 11/10/2016. FINDINGS: Cardiac and mediastinal silhouettes are stable in size and contour, and remain within normal limits. Lungs normally inflated. No focal infiltrates.  No pulmonary edema or pleural effusion. No pneumothorax. Bowel gas pattern within normal limits without obstruction or ileus. No abnormal bowel wall thickening. No free air. No soft tissue mass or abnormal calcification.Cholecystectomy clips noted. Metallic BB overlies the right upper quadrant, stable. Prominent degenerative changes noted within the lower lumbar spine and about the hips bilaterally. No acute osseous abnormality. IMPRESSION: 1. Nonobstructive bowel gas pattern with no radiographic evidence for acute intra-pathology. 2. No active cardiopulmonary disease. Electronically Signed   By: Jeannine Boga M.D.   On: 07/21/2017 02:58    Procedures Procedures (including critical care time)  Medications Ordered in ED Medications  sodium chloride 0.9 % bolus 1,000 mL (not administered)  ondansetron (ZOFRAN) injection 4 mg (not administered)  potassium chloride SA (K-DUR,KLOR-CON) CR tablet 40 mEq (not administered)  potassium chloride 10 mEq in 100 mL IVPB (not administered)     Initial Impression / Assessment and Plan / ED Course  I have reviewed the triage vital signs and the nursing notes.  Pertinent labs & imaging results that were available during my care of the patient were reviewed by me and considered in my medical decision making (see chart for details).    Patient with history of gastroparesis presenting with ongoing vomiting and diarrhea and generalized weakness leading to multiple falls.  She has dry mucous membranes.  Abdomen is soft without peritoneal signs.  Labs show significant hypokalemia And hypomagnesemia.  This will be repleted patient will be given IV fluids.  Suspect this is due to her gastroparesis and vomiting as well as intermittent diarrhea.  Her abdomen is soft.   Patient has had multiple falls due to her vomiting.  X-rays negative for fracture.  Given her severe hypokalemia plan admission for correction.  diCussed with Dr. Darrick Meigs.  CRITICAL CARE Performed by: Ezequiel Essex Total critical care time: 40 minutes Critical care time was exclusive of separately billable procedures and treating other patients. Critical care was necessary to treat or prevent imminent or life-threatening deterioration. Critical care was time spent personally by me on the following activities: development of treatment plan with patient and/or surrogate as well as nursing, discussions with consultants, evaluation of patient's response to treatment, examination of patient, obtaining history from patient or surrogate, ordering and performing treatments and interventions, ordering and review of laboratory studies, ordering and review of radiographic studies, pulse oximetry and re-evaluation of patient's condition.   Final Clinical Impressions(s) / ED Diagnoses   Final diagnoses:  Hypokalemia  Nausea vomiting and diarrhea    ED Discharge Orders    None       Cal Gindlesperger, Annie Main, MD 07/21/17 [phone]

## 2017-07-21 NOTE — Progress Notes (Signed)
Patient admitted to the hospital earlier this morning by Dr. Darrick Meigs.  Patient seen and examined.  She is feeling better at this time.  She has not had any further nausea, vomiting or diarrhea since admission.  She is currently n.p.o.  No abdominal pain.  Patient was admitted to the hospital with significant vomiting and diarrhea.  She has chronic vomiting for which she follows with gastroenterology.  She carries a diagnosis of gastroparesis and takes Reglan she was noted to have significant electrolyte abnormalities.  Plain films of the abdomen were unrevealing.  She is on potassium and magnesium supplementation.  She is also receiving IV fluids.  She is feeling better.  Advance diet to clear liquids today and soft foods tomorrow if she tolerates this.  She starts to have vomiting or diarrhea, may need GI evaluation.  Since she has not had any diarrhea since coming to the hospital, infectious etiology is less likely.  We will continue to monitor.  [employer]

## 2017-07-21 NOTE — H&P (Addendum)
TRH H&P    Patient Demographics:    Jenna Maldonado, is a 67 y.o. female  MRN: [id number]  DOB - Dec 29, 1950  Admit Date - 07/20/2017  Referring MD/NP/PA: Dr. Wyvonnia Dusky  Outpatient Primary MD for the patient is Monico Blitz, MD  Patient coming from: home  Chief complaint-vomiting   HPI:    Jenna Maldonado  is a 67 y.o. female, with history of intractable vomiting and diarrhea for the past two years since her gallbladder was removed for cholelithiasis, diabetes mellitus, came to hospital with worsening vomiting and diarrhea for past one week. Patient says that she usually vomits every day in the morning. She was diagnosed with gastroparesis by G.I. She also complains of diarrhea for past one week though she feels that she is constipated for past two days. Patient has been following at home. She had three falls within past one week due to dizziness and lightheadedness. She fell on right side hurting the right ribs and bruising to the right eye. She denies chest pain or shortness of breath. She denies dysuria. No coughing up any phlegm  in the ED, lab work showed potassium 2.3, magnesium 0.9    Review of systems:      All other systems reviewed and are negative.   With Past History of the following :    Past Medical History:  Diagnosis Date  . 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 Calculus of kidney    L inferior kidney ; non obstructing  . Chronic constipation   . Chronic headaches   . Depression   . Diabetic autonomic neuropathy ([place])   . Diabetic neuropathy ([place])   . Esophageal reflux   . Hypothyroidism, adult   . Orthostatic hypotension   . Residual foreign body in soft tissue   . 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 Shortness of breath       Past Surgical History:  Procedure Laterality Date  . carpel tunnel    . CHOLECYSTECTOMY  11/2014   June 2016  . Eye  Surgrey Bilateral    4-5 times for macular degeneration  . HAND SURGERY Bilateral 2001  . SHOULDER SURGERY Left   . TUBAL LIGATION        Social History:      Social History   Tobacco Use  . Smoking status: Never Smoker  . Smokeless tobacco: Never Used  Substance Use Topics  . Alcohol use: No       Family History :     Family History  Problem Relation Age of Onset  . Cancer Mother   . Diabetes Mother   . Suicidality Father        GSW  . Diabetes Sister        3 sisters  . Heart disease Brother        MI  . Diabetes Brother        2 brothers  . Stroke Neg Hx       Home Medications:   Prior to Admission  medications   Medication Sig Start Date End Date Taking? Authorizing Provider  aspirin EC 81 MG tablet Take 81 mg by mouth daily.    [provider]  atorvastatin (LIPITOR) 80 MG tablet Take 1 tablet (80 mg total) by mouth daily. 03/08/17   Narda Amber K, DO  carbamazepine (TEGRETOL XR) 100 MG 12 hr tablet Take 300 mg by mouth 2 (two) times daily.     [provider]  cyanocobalamin (,VITAMIN B-12,) 1000 MCG/ML injection INJECT 1ML INTO MUSCLE DAILY X 7 DAYS, THEN 1 ML ONCE WEEKLY X 4 WEEKS, THEN 1 ML MONTHLY 03/10/17   [provider]  DUREZOL 0.05 % EMUL 2 (two) times daily. 03/21/17   [provider]  fludrocortisone (FLORINEF) 0.1 MG tablet Take 2 tablets (0.2 mg total) by mouth daily. 10/24/15   [name], MD  FLUoxetine (PROZAC) 40 MG capsule Take 40 mg by mouth daily.  12/30/12   [provider]  insulin degludec (TRESIBA FLEXTOUCH) 100 UNIT/ML SOPN FlexTouch Pen Inject 25 Units into the skin at bedtime.     [provider]  levothyroxine (SYNTHROID, LEVOTHROID) 50 MCG tablet Take 50 mcg by mouth daily before breakfast.    [provider]  metFORMIN (GLUCOPHAGE) 500 MG tablet Take 500 mg by mouth 2 (two) times daily with a meal.     [provider]  metoCLOPramide (REGLAN) 5 MG tablet  Take 5 mg by mouth 3 (three) times daily.  02/06/17   [provider]  NYSTATIN powder as needed.  07/12/16   [provider]  ofloxacin (OCUFLOX) 0.3 % ophthalmic solution as directed. 03/21/17   [provider]  omeprazole (PRILOSEC) 20 MG capsule Take 20 mg by mouth daily.  10/11/12   [provider]  polyethylene glycol (MIRALAX / GLYCOLAX) packet Take 17 g by mouth daily. Patient not taking: Reported on 07/04/2017 10/24/15   [name], MD  promethazine (PHENERGAN) 25 MG tablet TAKE ONE-HALF TO ONE TABLET BY MOUTH EVERY 6 HOURS AS NEEDED 02/16/17   [provider]  senna (SENOKOT) 8.6 MG tablet Take 1 tablet by mouth daily. As needed 12/22/15   [provider]     Allergies:     Allergies  Allergen Reactions  . Atorvastatin Other (See Comments)    intolerance  . Codeine Nausea And Vomiting  . Flagyl [Metronidazole] Itching  . Hydrocodone Nausea And Vomiting  . Pioglitazone Other (See Comments)    Weight gain, edema  . Sulfa Antibiotics Other (See Comments)    Intolerance, vomiting   . Victoza [Liraglutide]     Upset stomach  . Ciprofloxacin Itching and Rash     Physical Exam:   Vitals  Blood pressure (!) 141/85, pulse 85, temperature 99 F (37.2 C), temperature source Oral, resp. rate 17, height 5\' 3"  (1.6 m), weight 70.4 kg (155 lb 4.8 oz), SpO2 97 %.  1.  General: appears in no acute distress  2. Psychiatric:  Intact judgement and  insight, awake alert, oriented x 3.  3. Neurologic: No focal neurological deficits, all cranial nerves intact.Strength 5/5 all 4 extremities, sensation intact all 4 extremities, plantars down going.  4. Eyes :  anicteric sclerae, moist conjunctivae with no lid lag. PERRLA.  5. ENMT:  Oropharynx clear with moist mucous membranes and good dentition  6. Neck:  supple, no cervical lymphadenopathy appriciated, No thyromegaly  7. Respiratory : Normal respiratory effort, good air  movement bilaterally,clear to  auscultation bilaterally  8. Cardiovascular :  RRR, no gallops, rubs or murmurs, no leg edema  9. Gastrointestinal:  Positive bowel sounds, abdomen soft, non-tender to palpation,no hepatosplenomegaly, no rigidity or guarding       10. Skin:  No cyanosis, normal texture and turgor, no rash, lesions or ulcers  11.Musculoskeletal:  Good muscle tone,  joints appear normal , no effusions,  normal range of motion    Data Review:    CBC Recent Labs  Lab 07/20/17 2213  WBC 9.6  HGB 13.6  HCT 39.7  PLT 190  MCV 84.8  MCH 29.1  MCHC 34.3  RDW 12.6  LYMPHSABS 3.5  MONOABS 0.7  EOSABS 0.1  BASOSABS 0.0   ------------------------------------------------------------------------------------------------------------------  Chemistries  Recent Labs  Lab 07/20/17 2213  NA 139  K 2.3*  CL 92*  CO2 30  GLUCOSE 128*  BUN 13  CREATININE 0.61  CALCIUM 7.3*  MG 0.9*  AST 31  ALT 28  ALKPHOS 88  BILITOT 0.9   ------------------------------------------------------------------------------------------------------------------  ------------------------------------------------------------------------------------------------------------------ GFR: Estimated Creatinine Clearance: 66 mL/min (by C-G formula based on SCr of 0.61 mg/dL). Liver Function Tests: Recent Labs  Lab 07/20/17 2213  AST 31  ALT 28  ALKPHOS 88  BILITOT 0.9  PROT 7.1  ALBUMIN 3.7   Recent Labs  Lab 07/20/17 2213  LIPASE 26    --------------------------------------------------------------------------------------------------------------- Urine analysis:    Component Value Date/Time   COLORURINE YELLOW 02/05/2016 1100   APPEARANCEUR CLEAR 02/05/2016 1100   LABSPEC 1.010 02/05/2016 1100   PHURINE 6.0 02/05/2016 1100   GLUCOSEU NEGATIVE 02/05/2016 1100   HGBUR TRACE (A) 02/05/2016 1100   BILIRUBINUR NEGATIVE 02/05/2016 1100   KETONESUR NEGATIVE 02/05/2016 1100    PROTEINUR NEGATIVE 02/05/2016 1100   NITRITE NEGATIVE 02/05/2016 1100   LEUKOCYTESUR MODERATE (A) 02/05/2016 1100      Imaging Results:    Dg Abdomen Acute W/chest  Result Date: 07/21/2017 CLINICAL DATA:  Initial evaluation for acute nausea, vomiting, diarrhea. Abdominal pain. EXAM: DG ABDOMEN ACUTE W/ 1V CHEST COMPARISON:  Prior radiograph from 11/10/2016. FINDINGS: Cardiac and mediastinal silhouettes are stable in size and contour, and remain within normal limits. Lungs normally inflated. No focal infiltrates. No pulmonary edema or pleural effusion. No pneumothorax. Bowel gas pattern within normal limits without obstruction or ileus. No abnormal bowel wall thickening. No free air. No soft tissue mass or abnormal calcification.Cholecystectomy clips noted. Metallic BB overlies the right upper quadrant, stable. Prominent degenerative changes noted within the lower lumbar spine and about the hips bilaterally. No acute osseous abnormality. IMPRESSION: 1. Nonobstructive bowel gas pattern with no radiographic evidence for acute intra-pathology. 2. No active cardiopulmonary disease. Electronically Signed   By: Jeannine Boga M.D.   On: 07/21/2017 02:58    My personal review of EKG: Rhythm NSR   Assessment & Plan:    Active Problems:   Hypokalemia   Gastroparesis due to DM (HCC)   Intractable vomiting   1. Intractable nausea and vomiting-likely from gastroparesis, start Zofran PRN for nausea and vomiting. Will also start Reglan 5 mg IV Q8 hours. 2. Hypokalemia- replace potassium and check BMP in am 3. Hypomagne semia-magnesium 0.9, replace magnesium and check magnesium level in a.m. 4. Diabetes mellitus-continue Tresiba 25 units sub Q daily, sliding scale insulin with NovoLog. 5. Adrenal insufficiency-continue Florinef 6. Diabetic neuropathy-continue carbamazepine 7. Rib pain- developed after patient fell at home, will obtain right rib series to rule out rib fracture   DVT Prophylaxis-    Lovenox   AM Labs Ordered, also please review  Full Orders  Family Communication: Admission, patients condition and plan of care including tests being ordered have been discussed with the patient and family member at bedside who indicate understanding and agree with the plan and Code Status.  Code Status: DNR  Admission status: observation  Time spent in minutes : 50 minutes   Oswald Hillock M.D on 07/21/2017 at 4:15 AM  Between 7am to 7pm - Pager - [phone]. After 7pm go to [email or web address] - password [facility]  [facility] - Office  [phone]

## 2017-07-21 NOTE — Progress Notes (Signed)
Denies nausea so far today.  Stated at home she usually vomits one time daily and usually it's green and movement aggravates nausea but sees no pattern with any certain foods.  Ambulated with assist and walker to bathroom.  Niece at bedside now.

## 2017-07-21 NOTE — ED Notes (Signed)
Date and time results received: 07/21/17 0037 (use smartphrase ".now" to insert current time)  Test: Mag Critical Value: 0.9  Name of Provider Notified: EDP, Dr. Wyvonnia Dusky  Orders Received? Or Actions Taken?: n/a

## 2017-07-21 NOTE — Care Management Obs Status (Signed)
[place] NOTIFICATION   Patient Details  Name: Jenna Maldonado MRN: [id number] Date of Birth: [birth year]   Medicare Observation Status Notification Given:  Yes    Sherald Barge, RN 07/21/2017, 1:37 PM

## 2017-07-21 NOTE — Progress Notes (Signed)
Tele called and QTC  529  .  Vitals 133/59  81  98%  RA.  Texted Dr. Roderic Palau.  Patient alert and oriented with no complaints of chest pain.

## 2017-07-22 DIAGNOSIS — E1169 Type 2 diabetes mellitus with other specified complication: Secondary | ICD-10-CM | POA: Diagnosis not present

## 2017-07-22 DIAGNOSIS — K3184 Gastroparesis: Secondary | ICD-10-CM

## 2017-07-22 DIAGNOSIS — E1143 Type 2 diabetes mellitus with diabetic autonomic (poly)neuropathy: Secondary | ICD-10-CM

## 2017-07-22 DIAGNOSIS — K529 Noninfective gastroenteritis and colitis, unspecified: Secondary | ICD-10-CM

## 2017-07-22 DIAGNOSIS — E876 Hypokalemia: Secondary | ICD-10-CM | POA: Diagnosis not present

## 2017-07-22 DIAGNOSIS — E785 Hyperlipidemia, unspecified: Secondary | ICD-10-CM | POA: Diagnosis not present

## 2017-07-22 DIAGNOSIS — Z794 Long term (current) use of insulin: Secondary | ICD-10-CM

## 2017-07-22 DIAGNOSIS — E039 Hypothyroidism, unspecified: Secondary | ICD-10-CM | POA: Diagnosis not present

## 2017-07-22 LAB — MAGNESIUM: Magnesium: 1.6 mg/dL — ABNORMAL LOW (ref 1.7–2.4)

## 2017-07-22 LAB — GLUCOSE, CAPILLARY
Glucose-Capillary: 173 mg/dL — ABNORMAL HIGH (ref 65–99)
Glucose-Capillary: 71 mg/dL (ref 65–99)
Glucose-Capillary: 79 mg/dL (ref 65–99)

## 2017-07-22 LAB — BASIC METABOLIC PANEL
Anion gap: 10 (ref 5–15)
BUN: 9 mg/dL (ref 6–20)
CHLORIDE: 103 mmol/L (ref 101–111)
CO2: 26 mmol/L (ref 22–32)
CREATININE: 0.45 mg/dL (ref 0.44–1.00)
Calcium: 6.9 mg/dL — ABNORMAL LOW (ref 8.9–10.3)
GFR calc Af Amer: >60 mL/min (ref >60)
GFR calc non Af Amer: >60 mL/min (ref >60)
Glucose, Bld: 88 mg/dL (ref 65–99)
Potassium: 3.8 mmol/L (ref 3.5–5.1)
Sodium: 139 mmol/L (ref 135–145)

## 2017-07-22 LAB — HIV ANTIBODY (ROUTINE TESTING W REFLEX): HIV Screen 4th Generation wRfx: NONREACTIVE

## 2017-07-22 MED ORDER — MAGNESIUM SULFATE 2 GM/50ML IV SOLN
2.0000 g | Freq: Once | INTRAVENOUS | Status: AC
  Administered 2017-07-22: 2 g via INTRAVENOUS
  Filled 2017-07-22: qty 50

## 2017-07-22 MED ORDER — METOCLOPRAMIDE HCL 5 MG PO TABS: 5.0000 mg | ORAL_TABLET | Freq: Three times a day (TID) | ORAL | 1 refills | Status: DC

## 2017-07-22 NOTE — Discharge Instructions (Signed)
FOLLOW UP WITH PRIMARY DOCTOR IN ONE OR TWO WEEKS OBTAIN BMP AND CBC LABS THEN KEEP GASTRO APPOINTMENT METFORMIN IS DISCONTINUED UNTIL EVALUATION BY GI DOCTOR

## 2017-07-22 NOTE — Progress Notes (Signed)
Has had no vomiting since admission.  Advanced to soft diet today and has eaten well.  IV removed and discharge papers signed.  To be discharged home.  Family to drive home

## 2017-07-22 NOTE — Discharge Summary (Signed)
Physician Discharge Summary  Jenna Maldonado:[id number] DOB: June 25, 1950 DOA: 07/20/2017  PCP: Monico Blitz, MD  Admit date: 07/20/2017 Discharge date: 07/22/2017  Admitted From: Home Disposition: Home  Recommendations for Outpatient Follow-up:  1. Follow up with PCP in 1-2 weeks 2. Please obtain BMP/CBC in one week 3. Follow-up with gastroenterology as an outpatient as previously scheduled. 4. Metformin has been discontinued until issues with diarrhea-evaluated by GI.  Home Health: Equipment/Devices:  Discharge Condition: Stable CODE STATUS: DNR Diet recommendation: Heart Healthy / Carb Modified  Brief/Interim Summary: 67 year old female with insulin-dependent diabetes, hypothyroidism and hyperlipidemia, presented to the hospital with nausea, vomiting and diarrhea.  Diarrhea is reported as more of a chronic issue and she has plans to follow-up with GI as an outpatient regarding this.  She does have a history of gastroparesis and has been taking Reglan.  She presented with worsening vomiting, unable to tolerate anything by mouth, significant hypokalemia and hypomagnesemia.  Patient was admitted to the hospital and treated supportively.  Potassium and magnesium were replaced.  She was adequately hydrated.  Stool studies were sent were found to be negative for any infectious process.  Diet was slowly advanced and she is now tolerating a solid diet.  She is not having any further vomiting.  It is possible that she has superimposed gastroenteritis which has since resolved.  Diarrhea appears to be manageable at this time, and she is tolerating solid foods.  Feels ready to discharge home.  She will be discharged on Reglan, antiemetics.  We will hold her metformin for now until issues with diarrhea have been addressed.  She plans to follow-up with GI in the beginning of March.  A1c is well controlled at 6.  We will continue her on insulin.  The remainder of her medical issues remained stable.  Discharge  Diagnoses:  Active Problems:   Type 2 diabetes mellitus with other specified complication (HCC)   Hypokalemia   Gastroparesis due to DM [facility])   Intractable vomiting   Chronic diarrhea   Hypomagnesemia   Hypothyroidism   Hyperlipidemia    Discharge Instructions  Discharge Instructions    Diet - low sodium heart healthy   Complete by:  As directed    Increase activity slowly   Complete by:  As directed      Allergies as of 07/22/2017      Reactions   Atorvastatin Other (See Comments)   intolerance   Codeine Nausea And Vomiting   Flagyl [metronidazole] Itching   Hydrocodone Nausea And Vomiting   Pioglitazone Other (See Comments)   Weight gain, edema   Sulfa Antibiotics Other (See Comments)   Intolerance, vomiting   Victoza [liraglutide]    Upset stomach   Ciprofloxacin Itching, Rash      Medication List    STOP taking these medications   metFORMIN 500 MG tablet Commonly known as:  GLUCOPHAGE     TAKE these medications   aspirin EC 81 MG tablet Take 81 mg by mouth daily.   atorvastatin 80 MG tablet Commonly known as:  LIPITOR Take 1 tablet (80 mg total) by mouth daily.   carbamazepine 100 MG 12 hr tablet Commonly known as:  TEGRETOL XR Take 300 mg by mouth 2 (two) times daily.   cyanocobalamin 1000 MCG/ML injection Commonly known as:  (VITAMIN B-12) INJECT 1ML INTO MUSCLE DAILY X 7 DAYS, THEN 1 ML ONCE WEEKLY X 4 WEEKS, THEN 1 ML MONTHLY   fludrocortisone 0.1 MG tablet Commonly known as:  FLORINEF  Take 2 tablets (0.2 mg total) by mouth daily.   FLUoxetine 40 MG capsule Commonly known as:  PROZAC Take 40 mg by mouth daily.   levothyroxine 50 MCG tablet Commonly known as:  SYNTHROID, LEVOTHROID Take 50 mcg by mouth daily before breakfast.   metoCLOPramide 5 MG tablet Commonly known as:  REGLAN Take 1 tablet (5 mg total) by mouth 3 (three) times daily before meals.   nystatin powder Generic drug:  nystatin Apply 1 g topically daily. Use daily  after bath.   omeprazole 20 MG capsule Commonly known as:  PRILOSEC Take 20 mg by mouth daily.   promethazine 25 MG tablet Commonly known as:  PHENERGAN TAKE ONE-HALF TO ONE TABLET BY MOUTH EVERY 6 HOURS AS NEEDED   senna 8.6 MG tablet Commonly known as:  SENOKOT Take 1 tablet by mouth daily as needed for constipation. As needed   TRESIBA FLEXTOUCH 100 UNIT/ML Sopn FlexTouch Pen Generic drug:  insulin degludec Inject 15-25 Units into the skin at bedtime.       Allergies  Allergen Reactions  . Atorvastatin Other (See Comments)    intolerance  . Codeine Nausea And Vomiting  . Flagyl [Metronidazole] Itching  . Hydrocodone Nausea And Vomiting  . Pioglitazone Other (See Comments)    Weight gain, edema  . Sulfa Antibiotics Other (See Comments)    Intolerance, vomiting   . Victoza [Liraglutide]     Upset stomach  . Ciprofloxacin Itching and Rash    Consultations:     Procedures/Studies: Dg Ribs Unilateral Right  Result Date: 07/21/2017 CLINICAL DATA:  Initial evaluation for acute right rib pain, recent fall. EXAM: RIGHT RIBS - 2 VIEW COMPARISON:  Prior radiograph from 11/10/2016. FINDINGS: Metallic BB marker overlies the lower right ribs at site of pain. Remotely healed fracture of the right posterior sixth and seventh ribs noted. No acute displaced rib fracture identified. Visualized right hemithorax clear. Metallic density again noted overlying the right upper quadrant. IMPRESSION: 1. No acute displaced rib fracture identified. 2. Remotely healed fractures of the right lateral sixth and seventh ribs. Electronically Signed   By: Jeannine Boga M.D.   On: 07/21/2017 04:41   Dg Abdomen Acute W/chest  Result Date: 07/21/2017 CLINICAL DATA:  Initial evaluation for acute nausea, vomiting, diarrhea. Abdominal pain. EXAM: DG ABDOMEN ACUTE W/ 1V CHEST COMPARISON:  Prior radiograph from 11/10/2016. FINDINGS: Cardiac and mediastinal silhouettes are stable in size and contour,  and remain within normal limits. Lungs normally inflated. No focal infiltrates. No pulmonary edema or pleural effusion. No pneumothorax. Bowel gas pattern within normal limits without obstruction or ileus. No abnormal bowel wall thickening. No free air. No soft tissue mass or abnormal calcification.Cholecystectomy clips noted. Metallic BB overlies the right upper quadrant, stable. Prominent degenerative changes noted within the lower lumbar spine and about the hips bilaterally. No acute osseous abnormality. IMPRESSION: 1. Nonobstructive bowel gas pattern with no radiographic evidence for acute intra-pathology. 2. No active cardiopulmonary disease. Electronically Signed   By: Jeannine Boga M.D.   On: 07/21/2017 02:58      Subjective: Better.  Tolerating solid diet.  No vomiting.  Had one loose bowel movement this morning.  No abdominal pain.  Discharge Exam: Vitals:   07/22/17 0501 07/22/17 1430  BP: (!) 155/78 (!) 149/66  Pulse: 75 72  Resp: 18 18  Temp: 98.2 F (36.8 C) 98.8 F (37.1 C)  SpO2: 99% 95%   Vitals:   07/21/17 0542 07/21/17 2010 07/22/17 0501 07/22/17 1430  BP: (!) 150/72 (!) 152/68 (!) 155/78 (!) 149/66  Pulse: 84 77 75 72  Resp: 18 18 18 18   Temp: 98.4 F (36.9 C) 98.5 F (36.9 C) 98.2 F (36.8 C) 98.8 F (37.1 C)  TempSrc: Oral Oral Oral Oral  SpO2: 95% 99% 99% 95%  Weight: 72.5 kg (159 lb 13.3 oz)     Height:        General: Pt is alert, awake, not in acute distress Cardiovascular: RRR, S1/S2 +, no rubs, no gallops Respiratory: CTA bilaterally, no wheezing, no rhonchi Abdominal: Soft, NT, ND, bowel sounds + Extremities: no edema, no cyanosis    The results of significant diagnostics from this hospitalization (including imaging, microbiology, ancillary and laboratory) are listed below for reference.     Microbiology: Recent Results (from the past 240 hour(s))  Stool Culture     Status: None (Preliminary result)   Collection Time: 07/19/17 12:30  PM  Result Value Ref Range Status   Salmonella/Shigella Screen Final report  Final   Stool Culture result 1 (RSASHR) Comment  Final    Comment: No Salmonella or Shigella recovered.   Campylobacter Culture WILL FOLLOW  Preliminary   E coli, Shiga toxin Assay Negative Negative Final  Stool C-Diff Toxin Assay     Status: None   Collection Time: 07/19/17 12:30 PM  Result Value Ref Range Status   C difficile Toxins A+B, EIA Negative Negative Final  Ova and parasite examination     Status: None   Collection Time: 07/19/17  3:45 PM  Result Value Ref Range Status   OVA + PARASITE EXAM Final report  Final    Comment: These results were obtained using wet preparation(s) and trichrome stained smear. This test does not include testing for Cryptosporidium parvum, Cyclospora, or Microsporidia.    O&P result 1 Comment  Final    Comment: No ova, cysts, or parasites seen. One negative specimen does not rule out the possibility of a parasitic infection.      Labs: BNP (last 3 results) No results for input(s): BNP in the last 8760 hours. Basic Metabolic Panel: Recent Labs  Lab 07/20/17 2213 07/21/17 0603 07/22/17 0549  NA 139 139 139  K 2.3* 2.8* 3.8  CL 92* 96* 103  CO2 30 30 26   GLUCOSE 128* 111* 88  BUN 13 12 9   CREATININE 0.61 0.58 0.45  CALCIUM 7.3* 7.1* 6.9*  MG 0.9* 1.6* 1.6*   Liver Function Tests: Recent Labs  Lab 07/20/17 2213  AST 31  ALT 28  ALKPHOS 88  BILITOT 0.9  PROT 7.1  ALBUMIN 3.7   Recent Labs  Lab 07/20/17 2213  LIPASE 26   No results for input(s): AMMONIA in the last 168 hours. CBC: Recent Labs  Lab 07/20/17 2213  WBC 9.6  NEUTROABS 5.4  HGB 13.6  HCT 39.7  MCV 84.8  PLT 190   Cardiac Enzymes: No results for input(s): CKTOTAL, CKMB, CKMBINDEX, TROPONINI in the last 168 hours. BNP: Invalid input(s): POCBNP CBG: Recent Labs  Lab 07/21/17 1151 07/21/17 1810 07/22/17 0017 07/22/17 0601 07/22/17 1130  GLUCAP 98 95 71 79 173*    D-Dimer No results for input(s): DDIMER in the last 72 hours. Hgb A1c Recent Labs    07/21/17 0603  HGBA1C 6.0*   Lipid Profile No results for input(s): CHOL, HDL, LDLCALC, TRIG, CHOLHDL, LDLDIRECT in the last 72 hours. Thyroid function studies No results for input(s): TSH, T4TOTAL, T3FREE, THYROIDAB in the last 72 hours.  Invalid  input(s): FREET3 Anemia work up No results for input(s): VITAMINB12, FOLATE, FERRITIN, TIBC, IRON, RETICCTPCT in the last 72 hours. Urinalysis    Component Value Date/Time   COLORURINE YELLOW 07/20/2017 2215   [place] 07/20/2017 2215   LABSPEC <1.005 (L) 07/20/2017 2215   PHURINE 6.5 07/20/2017 2215   GLUCOSEU NEGATIVE 07/20/2017 2215   HGBUR NEGATIVE 07/20/2017 2215   BILIRUBINUR NEGATIVE 07/20/2017 2215   KETONESUR TRACE (A) 07/20/2017 2215   PROTEINUR NEGATIVE 07/20/2017 2215   NITRITE NEGATIVE 07/20/2017 2215   LEUKOCYTESUR LARGE (A) 07/20/2017 2215   Sepsis Labs Invalid input(s): PROCALCITONIN,  WBC,  LACTICIDVEN Microbiology Recent Results (from the past 240 hour(s))  Stool Culture     Status: None (Preliminary result)   Collection Time: 07/19/17 12:30 PM  Result Value Ref Range Status   Salmonella/Shigella Screen Final report  Final   Stool Culture result 1 (RSASHR) Comment  Final    Comment: No Salmonella or Shigella recovered.   Campylobacter Culture WILL FOLLOW  Preliminary   E coli, Shiga toxin Assay Negative Negative Final  Stool C-Diff Toxin Assay     Status: None   Collection Time: 07/19/17 12:30 PM  Result Value Ref Range Status   C difficile Toxins A+B, EIA Negative Negative Final  Ova and parasite examination     Status: None   Collection Time: 07/19/17  3:45 PM  Result Value Ref Range Status   OVA + PARASITE EXAM Final report  Final    Comment: These results were obtained using wet preparation(s) and trichrome stained smear. This test does not include testing for Cryptosporidium parvum, Cyclospora, or  Microsporidia.    O&P result 1 Comment  Final    Comment: No ova, cysts, or parasites seen. One negative specimen does not rule out the possibility of a parasitic infection.      Time coordinating discharge: Over 30 minutes  SIGNED:   Kathie Dike, MD  [facility] 07/22/2017, 3:18 PM Pager   If 7PM-7AM, please contact night-coverage [email or web address] Password [email or web address]

## 2017-07-24 LAB — STOOL CULTURE: E coli, Shiga toxin Assay: NEGATIVE

## 2017-07-25 NOTE — Progress Notes (Signed)
Stool studies all negative. She was actually inpatient and just discharged. Keep appt with Dr. [name] upcoming.

## 2017-07-25 NOTE — Progress Notes (Signed)
LMOM to call.

## 2017-07-28 NOTE — Progress Notes (Signed)
Mailing a letter with results and appt reminder.

## 2017-08-11 ENCOUNTER — Ambulatory Visit (INDEPENDENT_AMBULATORY_CARE_PROVIDER_SITE_OTHER): Payer: Medicare Other | Admitting: Internal Medicine

## 2017-08-11 ENCOUNTER — Encounter: Payer: Self-pay | Admitting: Internal Medicine

## 2017-08-11 VITALS — BP 113/69 | HR 82 | Temp 97.6°F | Ht 63.0 in | Wt 158.8 lb

## 2017-08-11 DIAGNOSIS — K219 Gastro-esophageal reflux disease without esophagitis: Secondary | ICD-10-CM

## 2017-08-11 DIAGNOSIS — K3184 Gastroparesis: Secondary | ICD-10-CM | POA: Diagnosis not present

## 2017-08-11 NOTE — Patient Instructions (Addendum)
Stop Omeprazole  Stop Reglan and promethazine  - never take again (due to side effects)  Use Zofran 4 mg tablets - take one every 6 hours as needed for nausea (disp - 60) with 11 refills)   Trial of Dexilant 60 mg daily -  3 week supply of samples  Eventual colonoscopy for scrrening  OV 1 month

## 2017-08-11 NOTE — Progress Notes (Signed)
Primary Care Physician:  Monico Blitz, MD Primary Gastroenterologist:  Dr. [name]  Pre-Procedure History & Physical: HPI:  Jenna Maldonado is a 67 y.o. female here for follow-up of GERD and gastroparesis.  Family notes constant moving of mouth and tongue.  Persisting nausea; intermittant vomiting.  GERD sx controlled marginally well   Past Medical History:  Diagnosis Date  . 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 Calculus of kidney    L inferior kidney ; non obstructing  . Chronic constipation   . Chronic headaches   . Depression   . Diabetic autonomic neuropathy ([place])   . Diabetic neuropathy ([place])   . Esophageal reflux   . Hypothyroidism, adult   . Orthostatic hypotension   . Residual foreign body in soft tissue   . 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 Shortness of breath     Past Surgical History:  Procedure Laterality Date  . carpel tunnel    . CHOLECYSTECTOMY  11/2014   June 2016  . Eye Surgrey Bilateral    4-5 times for macular degeneration  . HAND SURGERY Bilateral 2001  . SHOULDER SURGERY Left   . TUBAL LIGATION      Prior to Admission medications   Medication Sig Start Date End Date Taking? Authorizing Provider  aspirin EC 81 MG tablet Take 81 mg by mouth daily.   Yes [provider]  atorvastatin (LIPITOR) 80 MG tablet Take 1 tablet (80 mg total) by mouth daily. 03/08/17  Yes Patel, Donika K, DO  carbamazepine (TEGRETOL XR) 100 MG 12 hr tablet Take 300 mg by mouth 2 (two) times daily.    Yes [provider]  cyanocobalamin (,VITAMIN B-12,) 1000 MCG/ML injection INJECT 1ML INTO MUSCLE DAILY X 7 DAYS, THEN 1 ML ONCE WEEKLY X 4 WEEKS, THEN 1 ML MONTHLY 03/10/17  Yes [provider]  fludrocortisone (FLORINEF) 0.1 MG tablet Take 2 tablets (0.2 mg total) by mouth daily. 10/24/15  Yes Ahmed, Chesley Mires, MD  FLUoxetine (PROZAC) 40 MG capsule Take 40 mg by mouth daily.  12/30/12  Yes [provider]  insulin  degludec (TRESIBA FLEXTOUCH) 100 UNIT/ML SOPN FlexTouch Pen Inject 15-25 Units into the skin at bedtime.    Yes [provider]  levothyroxine (SYNTHROID, LEVOTHROID) 50 MCG tablet Take 50 mcg by mouth daily before breakfast.   Yes [provider]  metoCLOPramide (REGLAN) 5 MG tablet Take 1 tablet (5 mg total) by mouth 3 (three) times daily before meals. 07/22/17 07/22/18 Yes Kathie Dike, MD  nystatin (NYSTATIN) powder Apply 1 g topically daily. Use daily after bath.   Yes [provider]  omeprazole (PRILOSEC) 20 MG capsule Take 20 mg by mouth daily.  10/11/12  Yes [provider]  promethazine (PHENERGAN) 25 MG tablet TAKE ONE-HALF TO ONE TABLET BY MOUTH EVERY 6 HOURS AS NEEDED 02/16/17  Yes [provider]  senna (SENOKOT) 8.6 MG tablet Take 1 tablet by mouth daily as needed for constipation. As needed 12/22/15  Yes [provider]    Allergies as of 08/11/2017 - Review Complete 08/11/2017  Allergen Reaction Noted  . Atorvastatin Other (See Comments) 10/14/2015  . Codeine Nausea And Vomiting 01/02/2013  . Flagyl [metronidazole] Itching 10/14/2015  . Hydrocodone Nausea And Vomiting 10/14/2015  . Pioglitazone Other (See Comments) 10/14/2015  . Sulfa antibiotics Other (See Comments) 10/14/2015  . Victoza [liraglutide]  04/03/2017  . Ciprofloxacin Itching and Rash 10/14/2015    Family  History  Problem Relation Age of Onset  . Cancer Mother   . Diabetes Mother   . Suicidality Father        GSW  . Diabetes Sister        3 sisters  . Heart disease Brother        MI  . Diabetes Brother        2 brothers  . Stroke Neg Hx     Social History   Socioeconomic History  . Marital status: Married    Spouse name: Laverna Peace  . Number of children: 2  . Years of education: 37  . Highest education level: Not on file  Social Needs  . Financial resource strain: Not on file  . Food insecurity - worry: Not on file  . Food insecurity - inability:  Not on file  . Transportation needs - medical: Not on file  . Transportation needs - non-medical: Not on file  Occupational History  . Occupation: N/A  Tobacco Use  . Smoking status: Never Smoker  . Smokeless tobacco: Never Used  Substance and Sexual Activity  . Alcohol use: No  . Drug use: No  . Sexual activity: Not on file  Other Topics Concern  . Not on file  Social History Narrative   01/15/16 Patient lives with daughter, Joseph Art   Caffeine Use: occasionally    Review of Systems: See HPI, otherwise negative ROS  Physical Exam: BP 113/69   Pulse 82   Temp 97.6 F (36.4 C) (Oral)   Ht 5\' 3"  (1.6 m)   Wt 158 lb 12.8 oz (72 kg)   BMI 28.13 kg/m  General:   Alert,   pleasant and cooperative in NAD; chronically iff appearing.  Accompanied by daughter Skin:  Intact without significant lesions or rashes. Eyes:  Sclera clear, no icterus.   Conjunctiva pink. Ears:  Normal auditory acuity. Nose:  No deformity, discharge,  or lesions. Mouth:  Ongoing movement of oral musculature and tongue Neck:  Supple; no masses or thyromegaly. No significant cervical adenopathy. Lungs:  Clear throughout to auscultation.   No wheezes, crackles, or rhonchi. No acute distress. Heart:  Regular rate and rhythm; no murmurs, clicks, rubs,  or gallops. Abdomen: Non-distended, normal bowel sounds.  Soft and nontender without appreciable mass or hepatosplenomegaly.  No succusion splash Pulses:  Normal pulses noted. Extremities:  Without clubbing or edema.  Impression:  Gastroparesis and GERD - suboptimal control of both.  Exhibiting sx consistent with tardive dyskinesia.  Recommendations:    Stop Omeprazole  Stop Reglan and promethazine  - never take again (due to side effects)  Use Zofran 4 mg tablets - take one every 6 hours as needed for nausea (disp - 60) with 11 refills)   Trial of Dexilant 60 mg daily -  3 week supply of samples  Eventual colonoscopy for scrrening  OV 1  month      Notice: This dictation was prepared with Dragon dictation along with smaller phrase technology. Any transcriptional errors that result from this process are unintentional and may not be corrected upon review.

## 2017-08-14 ENCOUNTER — Other Ambulatory Visit: Payer: Self-pay

## 2017-08-14 DIAGNOSIS — R112 Nausea with vomiting, unspecified: Secondary | ICD-10-CM

## 2017-08-14 MED ORDER — ONDANSETRON HCL 4 MG PO TABS: 4.0000 mg | ORAL_TABLET | Freq: Four times a day (QID) | ORAL | 11 refills | Status: DC | PRN

## 2017-09-13 ENCOUNTER — Encounter: Payer: Self-pay | Admitting: Gastroenterology

## 2017-09-13 ENCOUNTER — Ambulatory Visit (INDEPENDENT_AMBULATORY_CARE_PROVIDER_SITE_OTHER): Payer: Medicare Other | Admitting: Gastroenterology

## 2017-09-13 VITALS — BP 126/78 | HR 93 | Temp 97.8°F | Ht 63.0 in | Wt 157.0 lb

## 2017-09-13 DIAGNOSIS — K5900 Constipation, unspecified: Secondary | ICD-10-CM | POA: Diagnosis not present

## 2017-09-13 DIAGNOSIS — E1143 Type 2 diabetes mellitus with diabetic autonomic (poly)neuropathy: Secondary | ICD-10-CM | POA: Diagnosis not present

## 2017-09-13 DIAGNOSIS — K3184 Gastroparesis: Secondary | ICD-10-CM

## 2017-09-13 DIAGNOSIS — R634 Abnormal weight loss: Secondary | ICD-10-CM | POA: Diagnosis not present

## 2017-09-13 MED ORDER — DEXLANSOPRAZOLE 60 MG PO CPDR: 60.0000 mg | DELAYED_RELEASE_CAPSULE | Freq: Every day | ORAL | 3 refills | Status: DC

## 2017-09-13 NOTE — Patient Instructions (Signed)
I have sent Dexilant to the pharmacy to see how this is covered. Continue to take this once each day.   I have given you samples of Linzess to take once each morning, 30 minutes before breakfast for constipation. This may cause loose stool at first but should get better after a few days. If not, call me.   I have ordered blood work to have done today.  We have arranged an upper endoscopy and possible dilation if needed by Dr. [name] in the near future.  Further recommendations to follow, and we will see you after the procedure!  It was a pleasure to see you today. I strive to create trusting relationships with patients to provide genuine, compassionate, and quality care. I value your feedback. If you receive a survey regarding your visit,  I greatly appreciate you taking time to fill this out.   Annitta Needs, PhD, ANP-BC [facility]

## 2017-09-13 NOTE — Progress Notes (Signed)
.     Primary Care Physician:  Monico Blitz, MD  Primary GI: Dr. [name]    Chief Complaint  Patient presents with  . Nausea    vomiting daily  . Constipation    HPI:   Jenna Maldonado is a 67 y.o. female presenting today with a history of chronic constipation, gastroparesis. Last seen March 2019. She was taken off Reglan at that time due to symptoms consistent with tardive dyskinesia. Reglan and Phenergan both discontinued. She is here for close follow-up after the addition of Dexilant.   Niece, [name], is present with her today. Patient states she did not notice any improvement with nausea/vomiting/GERD while on Dexilant. Not taking Reglan. Zofran is BID, before breakfast and supper. Nausea is always underlying. Usually vomits at least once or twice a day. Will rarely have days without vomiting. Vomiting mainly in the mornings and sometimes after lunch. Notes this as "green bile", occasional food. Eating mainly soft foods like mac n cheese, applesauce. Sometimes pain after eating lower sternum but no true solid food dysphagia. Vitamins get hung in throat at times. RUQ pain chronic since cholecystectomy 2016. Occasionally worse after eating. No melena.   Notes chronic constipation, up to a week at a time without BMs, then will have several days of diarrhea. Stool softener and Miralax without much relief. Her niece notes that she vomits more and notes worsening nausea when she has not had a BM in several days. Patient also states she does not feel nauseated as long as she is laying still, and movement worsens symptoms.    EGD by Dr. Britta Mccreedy March 31, 2015 showed hiatal hernia, gastritis. Colonoscopy in remote past, and I do not have these records.   Weight loss noted, high of 184 in March 2018, steadily decreasing since Oct 2018 and now down to 157. In fact, she has lost 7 lbs in the past month alone. No classic signs of chronic mesenteric ischemia. CT 2017 with abundant stool in  colon.   Past Medical History:  Diagnosis Date  . 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 Calculus of kidney    L inferior kidney ; non obstructing  . Chronic constipation   . Chronic headaches   . Depression   . Diabetic autonomic neuropathy ([place])   . Diabetic neuropathy ([place])   . Esophageal reflux   . Hypothyroidism, adult   . Orthostatic hypotension   . Residual foreign body in soft tissue   . 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 Shortness of breath     Past Surgical History:  Procedure Laterality Date  . carpel tunnel    . CHOLECYSTECTOMY  11/2014   June 2016  . ESOPHAGOGASTRODUODENOSCOPY  03/2015   Dr. Britta Mccreedy: hiatal hernia, gastritis  . Eye Surgrey Bilateral    4-5 times for macular degeneration  . HAND SURGERY Bilateral 2001  . SHOULDER SURGERY Left   . TUBAL LIGATION      Current Outpatient Medications  Medication Sig Dispense Refill  . aspirin EC 81 MG tablet Take 81 mg by mouth daily.    [name] atorvastatin (LIPITOR) 80 MG tablet Take 1 tablet (80 mg total) by mouth daily. 30 tablet 5  . carbamazepine (TEGRETOL XR) 100 MG 12 hr tablet Take 300 mg by mouth 2 (two) times daily.     . cyanocobalamin (,VITAMIN B-12,) 1000 MCG/ML injection INJECT 1ML INTO MUSCLE DAILY X 7 DAYS, THEN 1 ML ONCE WEEKLY X  4 WEEKS, THEN 1 ML MONTHLY  0  . fludrocortisone (FLORINEF) 0.1 MG tablet Take 2 tablets (0.2 mg total) by mouth daily. 30 tablet 0  . FLUoxetine (PROZAC) 40 MG capsule Take 40 mg by mouth daily.     . insulin degludec (TRESIBA FLEXTOUCH) 100 UNIT/ML SOPN FlexTouch Pen Inject 15-25 Units into the skin at bedtime.     [name] levothyroxine (SYNTHROID, LEVOTHROID) 50 MCG tablet Take 50 mcg by mouth daily before breakfast.    . nystatin (NYSTATIN) powder Apply 1 g topically daily. Use daily after bath.    . ondansetron (ZOFRAN) 4 MG tablet Take 1 tablet (4 mg total) by mouth every 6 (six) hours as needed for nausea or vomiting. 60 tablet 11  . senna  (SENOKOT) 8.6 MG tablet Take 1 tablet by mouth daily as needed for constipation. As needed  3  . dexlansoprazole (DEXILANT) 60 MG capsule Take 1 capsule (60 mg total) by mouth daily. 90 capsule 3   No current facility-administered medications for this visit.     Allergies as of 09/13/2017 - Review Complete 09/13/2017  Allergen Reaction Noted  . Atorvastatin Other (See Comments) 10/14/2015  . Codeine Nausea And Vomiting 01/02/2013  . Flagyl [metronidazole] Itching 10/14/2015  . Hydrocodone Nausea And Vomiting 10/14/2015  . Phenergan [promethazine hcl]  08/11/2017  . Pioglitazone Other (See Comments) 10/14/2015  . Reglan [metoclopramide]  08/11/2017  . Sulfa antibiotics Other (See Comments) 10/14/2015  . Victoza [liraglutide]  04/03/2017  . Ciprofloxacin Itching and Rash 10/14/2015    Family History  Problem Relation Age of Onset  . Cancer Mother   . Diabetes Mother   . Suicidality Father        GSW  . Diabetes Sister        3 sisters  . Heart disease Brother        MI  . Diabetes Brother        2 brothers  . Stroke Neg Hx     Social History   Socioeconomic History  . Marital status: Married    Spouse name: Laverna Peace  . Number of children: 2  . Years of education: 18  . Highest education level: Not on file  Occupational History  . Occupation: N/A  Social Needs  . Financial resource strain: Not on file  . Food insecurity:    Worry: Not on file    Inability: Not on file  . Transportation needs:    Medical: Not on file    Non-medical: Not on file  Tobacco Use  . Smoking status: Never Smoker  . Smokeless tobacco: Never Used  Substance and Sexual Activity  . Alcohol use: No  . Drug use: No  . Sexual activity: Not on file  Lifestyle  . Physical activity:    Days per week: Not on file    Minutes per session: Not on file  . Stress: Not on file  Relationships  . Social connections:    Talks on phone: Not on file    Gets together: Not on file    Attends religious  service: Not on file    Active member of club or organization: Not on file    Attends meetings of clubs or organizations: Not on file    Relationship status: Not on file  Other Topics Concern  . Not on file  Social History Narrative   01/15/16 Patient lives with daughter, Joseph Art   Caffeine Use: occasionally    Review of Systems: As mentioned in  HPI   Physical Exam: BP 126/78   Pulse 93   Temp 97.8 F (36.6 C) (Oral)   Ht 5' 3"  (1.6 m)   Wt 157 lb (71.2 kg)   BMI 27.81 kg/m  General:   Alert and oriented. No distress noted. Pleasant and cooperative.  Head:  Normocephalic and atraumatic. Eyes:  Conjuctiva clear without scleral icterus. Mouth:  Oral mucosa pink and moist. Good dentition. No lesions. Abdomen:  +BS, soft, non-tender and non-distended. No rebound or guarding. No HSM or masses noted. Msk:  Symmetrical without gross deformities. Normal posture. Extremities:  Without edema. Neurologic:  Alert and  oriented x4 Psych:  Alert and cooperative. Normal mood and affect.    Pt seen and examined in short stay; no change; EGD ED today per plan.  The risks, benefits, limitations, alternatives and imponderables have been reviewed with the patient. Potential for esophageal dilation, biopsy, etc. have also been reviewed.  Questions have been answered. All parties agreeable.

## 2017-09-14 ENCOUNTER — Encounter (HOSPITAL_COMMUNITY): Payer: Self-pay

## 2017-09-14 ENCOUNTER — Encounter (HOSPITAL_COMMUNITY)
Admission: RE | Admit: 2017-09-14 | Discharge: 2017-09-14 | Disposition: A | Discharge status: Home / Self Care | Payer: Medicare Other | Source: Ambulatory Visit | Attending: Internal Medicine | Admitting: Internal Medicine

## 2017-09-14 ENCOUNTER — Encounter: Payer: Self-pay | Admitting: *Deleted

## 2017-09-14 ENCOUNTER — Other Ambulatory Visit: Payer: Self-pay

## 2017-09-14 DIAGNOSIS — Z794 Long term (current) use of insulin: Secondary | ICD-10-CM | POA: Diagnosis not present

## 2017-09-14 DIAGNOSIS — Z8249 Family history of ischemic heart disease and other diseases of the circulatory system: Secondary | ICD-10-CM | POA: Diagnosis not present

## 2017-09-14 DIAGNOSIS — E039 Hypothyroidism, unspecified: Secondary | ICD-10-CM | POA: Diagnosis not present

## 2017-09-14 DIAGNOSIS — Z823 Family history of stroke: Secondary | ICD-10-CM | POA: Diagnosis not present

## 2017-09-14 DIAGNOSIS — Z885 Allergy status to narcotic agent status: Secondary | ICD-10-CM | POA: Diagnosis not present

## 2017-09-14 DIAGNOSIS — K3189 Other diseases of stomach and duodenum: Secondary | ICD-10-CM | POA: Diagnosis not present

## 2017-09-14 DIAGNOSIS — R51 Headache: Secondary | ICD-10-CM | POA: Diagnosis not present

## 2017-09-14 DIAGNOSIS — F329 Major depressive disorder, single episode, unspecified: Secondary | ICD-10-CM | POA: Diagnosis not present

## 2017-09-14 DIAGNOSIS — R1013 Epigastric pain: Secondary | ICD-10-CM | POA: Diagnosis not present

## 2017-09-14 DIAGNOSIS — Z87442 Personal history of urinary calculi: Secondary | ICD-10-CM | POA: Diagnosis not present

## 2017-09-14 DIAGNOSIS — K219 Gastro-esophageal reflux disease without esophagitis: Secondary | ICD-10-CM | POA: Diagnosis not present

## 2017-09-14 DIAGNOSIS — Z79899 Other long term (current) drug therapy: Secondary | ICD-10-CM | POA: Diagnosis not present

## 2017-09-14 DIAGNOSIS — Z881 Allergy status to other antibiotic agents status: Secondary | ICD-10-CM | POA: Diagnosis not present

## 2017-09-14 DIAGNOSIS — R634 Abnormal weight loss: Secondary | ICD-10-CM

## 2017-09-14 DIAGNOSIS — E1140 Type 2 diabetes mellitus with diabetic neuropathy, unspecified: Secondary | ICD-10-CM | POA: Diagnosis not present

## 2017-09-14 DIAGNOSIS — Z7982 Long term (current) use of aspirin: Secondary | ICD-10-CM | POA: Diagnosis not present

## 2017-09-14 DIAGNOSIS — Z888 Allergy status to other drugs, medicaments and biological substances status: Secondary | ICD-10-CM | POA: Diagnosis not present

## 2017-09-14 DIAGNOSIS — R112 Nausea with vomiting, unspecified: Secondary | ICD-10-CM

## 2017-09-14 DIAGNOSIS — I951 Orthostatic hypotension: Secondary | ICD-10-CM | POA: Diagnosis not present

## 2017-09-14 DIAGNOSIS — Z833 Family history of diabetes mellitus: Secondary | ICD-10-CM | POA: Diagnosis not present

## 2017-09-14 DIAGNOSIS — K5909 Other constipation: Secondary | ICD-10-CM | POA: Diagnosis not present

## 2017-09-14 DIAGNOSIS — I452 Bifascicular block: Secondary | ICD-10-CM | POA: Diagnosis not present

## 2017-09-14 DIAGNOSIS — R1310 Dysphagia, unspecified: Secondary | ICD-10-CM | POA: Diagnosis present

## 2017-09-14 DIAGNOSIS — Z9049 Acquired absence of other specified parts of digestive tract: Secondary | ICD-10-CM | POA: Diagnosis not present

## 2017-09-14 DIAGNOSIS — Z809 Family history of malignant neoplasm, unspecified: Secondary | ICD-10-CM | POA: Diagnosis not present

## 2017-09-14 DIAGNOSIS — K317 Polyp of stomach and duodenum: Secondary | ICD-10-CM | POA: Diagnosis not present

## 2017-09-14 LAB — CBC
HEMATOCRIT: 37.7 % (ref 36.0–46.0)
HEMOGLOBIN: 12.8 g/dL (ref 12.0–15.0)
MCH: 29.4 pg (ref 26.0–34.0)
MCHC: 34 g/dL (ref 30.0–36.0)
MCV: 86.5 fL (ref 78.0–100.0)
Platelets: 223 10*3/uL (ref 150–400)
RBC: 4.36 MIL/uL (ref 3.87–5.11)
RDW: 12.5 % (ref 11.5–15.5)
WBC: 9.6 10*3/uL (ref 4.0–10.5)

## 2017-09-14 NOTE — Patient Instructions (Signed)
Jenna Maldonado  09/14/2017     @PREFPERIOPPHARMACY @   Your procedure is scheduled on 09/15/2017.  Report to [facility] at 615 A.M.  Call this number if you have problems the morning of surgery:  [phone]   Remember:  Do not eat food or drink liquids after midnight.  Take these medicines the morning of surgery with A SIP OF WATER Tegretol, Dexilant, Prozac, Florinef, Synthroid   Do not wear jewelry, make-up or nail polish.  Do not wear lotions, powders, or perfumes, or deodorant.  Do not shave 48 hours prior to surgery.  Men may shave face and neck.  Do not bring valuables to the hospital.  [facility] is not responsible for any belongings or valuables.  Contacts, dentures or bridgework may not be worn into surgery.  Leave your suitcase in the car.  After surgery it may be brought to your room.  For patients admitted to the hospital, discharge time will be determined by your treatment team.  Patients discharged the day of surgery will not be allowed to drive home.    Please read over the following fact sheets that you were given. Anesthesia Post-op Instructions     PATIENT INSTRUCTIONS POST-ANESTHESIA  IMMEDIATELY FOLLOWING SURGERY:  Do not drive or operate machinery for the first twenty four hours after surgery.  Do not make any important decisions for twenty four hours after surgery or while taking narcotic pain medications or sedatives.  If you develop intractable nausea and vomiting or a severe headache please notify your doctor immediately.  FOLLOW-UP:  Please make an appointment with your surgeon as instructed. You do not need to follow up with anesthesia unless specifically instructed to do so.  WOUND CARE INSTRUCTIONS (if applicable):  Keep a dry clean dressing on the anesthesia/puncture wound site if there is drainage.  Once the wound has quit draining you may leave it open to air.  Generally you should leave the bandage intact for twenty four hours unless there is  drainage.  If the epidural site drains for more than 36-48 hours please call the anesthesia department.  QUESTIONS?:  Please feel free to call your physician or the hospital operator if you have any questions, and they will be happy to assist you.      Esophageal Dilatation Esophageal dilatation is a procedure to open a blocked or narrowed part of the esophagus. The esophagus is the long tube in your throat that carries food and liquid from your mouth to your stomach. The procedure is also called esophageal dilation. You may need this procedure if you have a buildup of scar tissue in your esophagus that makes it difficult, painful, or even impossible to swallow. This can be caused by gastroesophageal reflux disease (GERD). In rare cases, people need this procedure because they have cancer of the esophagus or a problem with the way food moves through the esophagus. Sometimes you may need to have another dilatation to enlarge the opening of the esophagus gradually. Tell a health care provider about:  Any allergies you have.  All medicines you are taking, including vitamins, herbs, eye drops, creams, and over-the-counter medicines.  Any problems you or family members have had with anesthetic medicines.  Any blood disorders you have.  Any surgeries you have had.  Any medical conditions you have.  Any antibiotic medicines you are required to take before dental procedures. What are the risks? Generally, this is a safe procedure. However, problems can occur and include:  Bleeding  from a tear in the lining of the esophagus.  A hole (perforation) in the esophagus.  What happens before the procedure?  Do not eat or drink anything after midnight on the night before the procedure or as directed by your health care provider.  Ask your health care provider about changing or stopping your regular medicines. This is especially important if you are taking diabetes medicines or blood  thinners.  Plan to have someone take you home after the procedure. What happens during the procedure?  You will be given a medicine that makes you relaxed and sleepy (sedative).  A medicine may be sprayed or gargled to numb the back of the throat.  Your health care provider can use various instruments to do an esophageal dilatation. During the procedure, the instrument used will be placed in your mouth and passed down into your esophagus. Options include: ? Simple dilators. This instrument is carefully placed in the esophagus to stretch it. ? Guided wire bougies. In this method, a flexible tube (endoscope) is used to insert a wire into the esophagus. The dilator is passed over this wire to enlarge the esophagus. Then the wire is removed. ? Balloon dilators. An endoscope with a small balloon at the end is passed down into the esophagus. Inflating the balloon gently stretches the esophagus and opens it up. What happens after the procedure?  Your blood pressure, heart rate, breathing rate, and blood oxygen level will be monitored often until the medicines you were given have worn off.  Your throat may feel slightly sore and will probably still feel numb. This will improve slowly over time.  You will not be allowed to eat or drink until the throat numbness has resolved.  If this is a same-day procedure, you may be allowed to go home once you have been able to drink, urinate, and sit on the edge of the bed without nausea or dizziness.  If this is a same-day procedure, you should have a friend or family member with you for the next 24 hours after the procedure. This information is not intended to replace advice given to you by your health care provider. Make sure you discuss any questions you have with your health care provider. Document Released: 07/21/2005 Document Revised: 11/05/2015 Document Reviewed: 10/09/2013 Elsevier Interactive Patient Education  2018 [employer]. Esophagogastroduodenoscopy Esophagogastroduodenoscopy (EGD) is a procedure to examine the lining of the esophagus, stomach, and first part of the small intestine (duodenum). This procedure is done to check for problems such as inflammation, bleeding, ulcers, or growths. During this procedure, a long, flexible, lighted tube with a camera attached (endoscope) is inserted down the throat. Tell a health care provider about:  Any allergies you have.  All medicines you are taking, including vitamins, herbs, eye drops, creams, and over-the-counter medicines.  Any problems you or family members have had with anesthetic medicines.  Any blood disorders you have.  Any surgeries you have had.  Any medical conditions you have.  Whether you are pregnant or may be pregnant. What are the risks? Generally, this is a safe procedure. However, problems may occur, including:  Infection.  Bleeding.  A tear (perforation) in the esophagus, stomach, or duodenum.  Trouble breathing.  Excessive sweating.  Spasms of the larynx.  A slowed heartbeat.  Low blood pressure.  What happens before the procedure?  Follow instructions from your health care provider about eating or drinking restrictions.  Ask your health care provider about: ? Changing or stopping your  regular medicines. This is especially important if you are taking diabetes medicines or blood thinners. ? Taking medicines such as aspirin and ibuprofen. These medicines can thin your blood. Do not take these medicines before your procedure if your health care provider instructs you not to.  Plan to have someone take you home after the procedure.  If you wear dentures, be ready to remove them before the procedure. What happens during the procedure?  To reduce your risk of infection, your health care team will wash or sanitize their hands.  An IV tube will be put in a vein in your hand or arm. You will get medicines and fluids  through this tube.  You will be given one or more of the following: ? A medicine to help you relax (sedative). ? A medicine to numb the area (local anesthetic). This medicine may be sprayed into your throat. It will make you feel more comfortable and keep you from gagging or coughing during the procedure. ? A medicine for pain.  A mouth guard may be placed in your mouth to protect your teeth and to keep you from biting on the endoscope.  You will be asked to lie on your left side.  The endoscope will be lowered down your throat into your esophagus, stomach, and duodenum.  Air will be put into the endoscope. This will help your health care provider see better.  The lining of your esophagus, stomach, and duodenum will be examined.  Your health care provider may: ? Take a tissue sample so it can be looked at in a lab (biopsy). ? Remove growths. ? Remove objects (foreign bodies) that are stuck. ? Treat any bleeding with medicines or other devices that stop tissue from bleeding. ? Widen (dilate) or stretch narrowed areas of your esophagus and stomach.  The endoscope will be taken out. The procedure may vary among health care providers and hospitals. What happens after the procedure?  Your blood pressure, heart rate, breathing rate, and blood oxygen level will be monitored often until the medicines you were given have worn off.  Do not eat or drink anything until the numbing medicine has worn off and your gag reflex has returned. This information is not intended to replace advice given to you by your health care provider. Make sure you discuss any questions you have with your health care provider. Document Released: 09/30/2004 Document Revised: 11/05/2015 Document Reviewed: 04/23/2015 Elsevier Interactive Patient Education  [employer].

## 2017-09-15 ENCOUNTER — Ambulatory Visit (HOSPITAL_COMMUNITY)
Admission: RE | Admit: 2017-09-15 | Discharge: 2017-09-15 | Disposition: A | Discharge status: Home / Self Care | Payer: Medicare Other | Source: Ambulatory Visit | Attending: Internal Medicine | Admitting: Internal Medicine

## 2017-09-15 ENCOUNTER — Ambulatory Visit (HOSPITAL_COMMUNITY): Payer: Medicare Other | Admitting: Anesthesiology

## 2017-09-15 ENCOUNTER — Other Ambulatory Visit: Payer: Self-pay

## 2017-09-15 ENCOUNTER — Encounter (HOSPITAL_COMMUNITY): Payer: Self-pay

## 2017-09-15 ENCOUNTER — Encounter (HOSPITAL_COMMUNITY)
Admission: RE | Disposition: A | Discharge status: Home / Self Care | Payer: Self-pay | Source: Ambulatory Visit | Attending: Internal Medicine

## 2017-09-15 DIAGNOSIS — K5909 Other constipation: Secondary | ICD-10-CM | POA: Insufficient documentation

## 2017-09-15 DIAGNOSIS — R790 Abnormal level of blood mineral: Secondary | ICD-10-CM

## 2017-09-15 DIAGNOSIS — K219 Gastro-esophageal reflux disease without esophagitis: Secondary | ICD-10-CM | POA: Insufficient documentation

## 2017-09-15 DIAGNOSIS — R1310 Dysphagia, unspecified: Secondary | ICD-10-CM | POA: Insufficient documentation

## 2017-09-15 DIAGNOSIS — R1013 Epigastric pain: Secondary | ICD-10-CM

## 2017-09-15 DIAGNOSIS — Z885 Allergy status to narcotic agent status: Secondary | ICD-10-CM | POA: Insufficient documentation

## 2017-09-15 DIAGNOSIS — Z823 Family history of stroke: Secondary | ICD-10-CM | POA: Insufficient documentation

## 2017-09-15 DIAGNOSIS — K3189 Other diseases of stomach and duodenum: Secondary | ICD-10-CM | POA: Diagnosis not present

## 2017-09-15 DIAGNOSIS — E039 Hypothyroidism, unspecified: Secondary | ICD-10-CM | POA: Insufficient documentation

## 2017-09-15 DIAGNOSIS — Z833 Family history of diabetes mellitus: Secondary | ICD-10-CM | POA: Insufficient documentation

## 2017-09-15 DIAGNOSIS — Z7982 Long term (current) use of aspirin: Secondary | ICD-10-CM | POA: Insufficient documentation

## 2017-09-15 DIAGNOSIS — F329 Major depressive disorder, single episode, unspecified: Secondary | ICD-10-CM | POA: Insufficient documentation

## 2017-09-15 DIAGNOSIS — E1140 Type 2 diabetes mellitus with diabetic neuropathy, unspecified: Secondary | ICD-10-CM | POA: Insufficient documentation

## 2017-09-15 DIAGNOSIS — Z794 Long term (current) use of insulin: Secondary | ICD-10-CM | POA: Insufficient documentation

## 2017-09-15 DIAGNOSIS — K317 Polyp of stomach and duodenum: Secondary | ICD-10-CM | POA: Insufficient documentation

## 2017-09-15 DIAGNOSIS — Z87442 Personal history of urinary calculi: Secondary | ICD-10-CM | POA: Insufficient documentation

## 2017-09-15 DIAGNOSIS — Z809 Family history of malignant neoplasm, unspecified: Secondary | ICD-10-CM | POA: Insufficient documentation

## 2017-09-15 DIAGNOSIS — Z9049 Acquired absence of other specified parts of digestive tract: Secondary | ICD-10-CM | POA: Insufficient documentation

## 2017-09-15 DIAGNOSIS — Z8249 Family history of ischemic heart disease and other diseases of the circulatory system: Secondary | ICD-10-CM | POA: Insufficient documentation

## 2017-09-15 DIAGNOSIS — Z79899 Other long term (current) drug therapy: Secondary | ICD-10-CM | POA: Insufficient documentation

## 2017-09-15 DIAGNOSIS — Z888 Allergy status to other drugs, medicaments and biological substances status: Secondary | ICD-10-CM | POA: Insufficient documentation

## 2017-09-15 DIAGNOSIS — I452 Bifascicular block: Secondary | ICD-10-CM | POA: Insufficient documentation

## 2017-09-15 DIAGNOSIS — R51 Headache: Secondary | ICD-10-CM | POA: Insufficient documentation

## 2017-09-15 DIAGNOSIS — Z881 Allergy status to other antibiotic agents status: Secondary | ICD-10-CM | POA: Insufficient documentation

## 2017-09-15 DIAGNOSIS — I951 Orthostatic hypotension: Secondary | ICD-10-CM | POA: Insufficient documentation

## 2017-09-15 HISTORY — PX: BIOPSY: SHX5522

## 2017-09-15 HISTORY — DX: Other complications of anesthesia, initial encounter: T88.59XA

## 2017-09-15 HISTORY — DX: Adverse effect of unspecified anesthetic, initial encounter: T41.45XA

## 2017-09-15 HISTORY — DX: Other specified postprocedural states: Z98.890

## 2017-09-15 HISTORY — PX: ESOPHAGOGASTRODUODENOSCOPY (EGD) WITH PROPOFOL: SHX5813

## 2017-09-15 HISTORY — DX: Nausea with vomiting, unspecified: R11.2

## 2017-09-15 HISTORY — PX: MALONEY DILATION: SHX5535

## 2017-09-15 LAB — GLUCOSE, CAPILLARY
Glucose-Capillary: 156 mg/dL — ABNORMAL HIGH (ref 65–99)
Glucose-Capillary: 161 mg/dL — ABNORMAL HIGH (ref 65–99)

## 2017-09-15 SURGERY — ESOPHAGOGASTRODUODENOSCOPY (EGD) WITH PROPOFOL
Anesthesia: Monitor Anesthesia Care

## 2017-09-15 MED ORDER — LIDOCAINE VISCOUS 2 % MT SOLN
OROMUCOSAL | Status: AC
  Filled 2017-09-15: qty 15

## 2017-09-15 MED ORDER — POTASSIUM CHLORIDE ER 10 MEQ PO TBCR: 20.0000 meq | EXTENDED_RELEASE_TABLET | Freq: Two times a day (BID) | ORAL | 0 refills | Status: DC

## 2017-09-15 MED ORDER — PROPOFOL 500 MG/50ML IV EMUL
INTRAVENOUS | Status: DC | PRN
  Administered 2017-09-15: 100 ug/kg/min via INTRAVENOUS

## 2017-09-15 MED ORDER — SIMETHICONE 40 MG/0.6ML PO SUSP
ORAL | Status: AC
  Filled 2017-09-15: qty 0.6

## 2017-09-15 MED ORDER — LIDOCAINE VISCOUS 2 % MT SOLN
15.0000 mL | Freq: Once | OROMUCOSAL | Status: AC
  Administered 2017-09-15: 15 mL via OROMUCOSAL

## 2017-09-15 MED ORDER — PROPOFOL 10 MG/ML IV BOLUS
INTRAVENOUS | Status: AC
  Filled 2017-09-15: qty 40

## 2017-09-15 MED ORDER — LACTATED RINGERS IV SOLN
INTRAVENOUS | Status: DC
  Administered 2017-09-15: 07:00:00 via INTRAVENOUS

## 2017-09-15 MED ORDER — PROPOFOL 10 MG/ML IV BOLUS
INTRAVENOUS | Status: DC | PRN
  Administered 2017-09-15 (×2): 20 mg via INTRAVENOUS

## 2017-09-15 NOTE — Op Note (Signed)
[facility] Patient Name: Jenna Maldonado Procedure Date: 09/15/2017 7:18 AM MRN: [id number] Date of Birth: 20-Oct-1950 Attending MD: Norvel Richards , MD CSN: [id number] Age: 67 Admit Type: Outpatient Procedure:                Upper GI endoscopy Indications:              Epigastric abdominal pain, Dysphagia Providers:                Norvel Richards, MD, Janeece Riggers, RN, Nelma Rothman, Technician Referring MD:              Medicines:                Propofol per Anesthesia Complications:            No immediate complications. Estimated Blood Loss:     Estimated blood loss was minimal. Procedure:                Pre-Anesthesia Assessment:                           - Prior to the procedure, a History and Physical                            was performed, and patient medications and                            allergies were reviewed. The patient's tolerance of                            previous anesthesia was also reviewed. The risks                            and benefits of the procedure and the sedation                            options and risks were discussed with the patient.                            All questions were answered, and informed consent                            was obtained. Prior Anticoagulants: The patient has                            taken no previous anticoagulant or antiplatelet                            agents. ASA Grade Assessment: III - A patient with                            severe systemic disease. After reviewing the risks  and benefits, the patient was deemed in                            satisfactory condition to undergo the procedure.                           - Prior to the procedure, a History and Physical                            was performed, and patient medications and                            allergies were reviewed. The patient's tolerance of                            previous  anesthesia was also reviewed. The risks                            and benefits of the procedure and the sedation                            options and risks were discussed with the patient.                            All questions were answered, and informed consent                            was obtained. Prior Anticoagulants: The patient has                            taken no previous anticoagulant or antiplatelet                            agents. After reviewing the risks and benefits, the                            patient was deemed in satisfactory condition to                            undergo the procedure.                           After obtaining informed consent, the endoscope was                            passed under direct vision. Throughout the                            procedure, the patient's blood pressure, pulse, and                            oxygen saturations were monitored continuously. The  EG-[id number] ([id number]) scope was introduced through the                            and advanced to the second part of duodenum. The                            upper GI endoscopy was accomplished without                            difficulty. The patient tolerated the procedure                            well. Scope In: 7:52:15 AM Scope Out: 8:01:23 AM Total Procedure Duration: 0 hours 9 minutes 8 seconds  Findings:      The examined esophagus was normal. The scope was withdrawn. Dilation was       performed with a Maloney dilator with mild resistance at 47 Fr.      Abnormal mucosa - injecte, congested appearing. Some snake skinning       appearance. No ulcer or infiltrating process. Patent pylorus.      The duodenal bulb and second portion of the duodenum were normal. The       dilation site was examined following endoscope reinsertion and showed no       change. Estimated blood loss: none. multiple 2-3 mm gastric polyps. One       biopsies. gatric  mucosa bx'd as well. Impression:               - Normal esophagus. Dilated.                           - Mucosal changes in the.                           - Normal duodenal bulb and second portion of the                            duodenum.                           - No specimens collected. Moderate Sedation:      Moderate (conscious) sedation was personally administered by an       anesthesia professional. The following parameters were monitored: oxygen       saturation, heart rate, blood pressure, respiratory rate, EKG, adequacy       of pulmonary ventilation, and response to care. Total physician       intraservice time was 13 minutes. Recommendation:           - Patient has a contact number available for                            emergencies. The signs and symptoms of potential                            delayed complications were discussed with the  patient. Return to normal activities tomorrow.                            Written discharge instructions were provided to the                            patient.                           - Continue present medications. Continue Linzess                            and Dexilant                           - Await pathology results.                           - Return to GI office in 1 month. Procedure Code(s):        --- Professional ---                           [phone], Esophagogastroduodenoscopy, flexible,                            transoral; diagnostic, including collection of                            specimen(s) by brushing or washing, when performed                            (separate procedure)                           43450, Dilation of esophagus, by unguided sound or                            bougie, single or multiple passes Diagnosis Code(s):        --- Professional ---                           K31.89, Other diseases of stomach and duodenum                           R10.13, Epigastric pain                            R13.10, Dysphagia, unspecified CPT copyright 2017 American Medical Association. All rights reserved. The codes documented in this report are preliminary and upon coder review may  be revised to meet current compliance requirements. Cristopher Estimable. Dezra Mandella, MD Norvel Richards, MD 09/15/2017 8:23:00 AM This report has been signed electronically. Number of Addenda: 0

## 2017-09-15 NOTE — Anesthesia Preprocedure Evaluation (Signed)
Anesthesia Evaluation  Patient identified by MRN, date of birth, ID band Patient awake    Reviewed: Allergy & Precautions, NPO status , Patient's Chart, lab work & pertinent test results  History of Anesthesia Complications (+) PONV  Airway Mallampati: II  TM Distance: >3 FB Neck ROM: Full    Dental no notable dental hx. (+) Missing, Poor Dentition   Pulmonary shortness of breath, sleep apnea ,    Pulmonary exam normal breath sounds clear to auscultation       Cardiovascular Normal cardiovascular exam+ dysrhythmias  Rhythm:Regular Rate:Normal     Neuro/Psych  Headaches, Depression CVA, Residual Symptoms    GI/Hepatic GERD  ,  Endo/Other  diabetesHypothyroidism   Renal/GU Renal disease     Musculoskeletal   Abdominal   Peds  Hematology   Anesthesia Other Findings   Reproductive/Obstetrics                             Anesthesia Physical Anesthesia Plan  ASA: III  Anesthesia Plan: MAC   Post-op Pain Management:    Induction: Intravenous  PONV Risk Score and Plan:   Airway Management Planned: Nasal Cannula  Additional Equipment:   Intra-op Plan:   Post-operative Plan:   Informed Consent: I have reviewed the patients History and Physical, chart, labs and discussed the procedure including the risks, benefits and alternatives for the proposed anesthesia with the patient or authorized representative who has indicated his/her understanding and acceptance.     Plan Discussed with: CRNA  Anesthesia Plan Comments:         Anesthesia Quick Evaluation

## 2017-09-15 NOTE — Anesthesia Postprocedure Evaluation (Signed)
Anesthesia Post Note  Patient: IRAM ASTORINO  Procedure(s) Performed: ESOPHAGOGASTRODUODENOSCOPY (EGD) WITH PROPOFOL (N/A ) MALONEY DILATION (N/A ) BIOPSY  Patient location during evaluation: PACU Anesthesia Type: MAC Level of consciousness: awake and alert and patient cooperative Pain management: satisfactory to patient Vital Signs Assessment: post-procedure vital signs reviewed and stable Respiratory status: spontaneous breathing Cardiovascular status: stable Postop Assessment: no apparent nausea or vomiting Anesthetic complications: no     Last Vitals:  Vitals:   09/15/17 0810 09/15/17 0815  BP: (!) 120/59 127/66  Pulse: 69 68  Resp: (!) 0 10  Temp: 36.5 C   SpO2: 100% 100%    Last Pain:  Vitals:   09/15/17 0810  TempSrc:   PainSc: Asleep                 Leanndra Pember

## 2017-09-15 NOTE — Transfer of Care (Signed)
Immediate Anesthesia Transfer of Care Note  Patient: Jenna Maldonado  Procedure(s) Performed: ESOPHAGOGASTRODUODENOSCOPY (EGD) WITH PROPOFOL (N/A ) MALONEY DILATION (N/A ) BIOPSY  Patient Location: PACU  Anesthesia Type:MAC  Level of Consciousness: awake and patient cooperative  Airway & Oxygen Therapy: Patient Spontanous Breathing and Patient connected to nasal cannula oxygen  Post-op Assessment: Report given to RN and Post -op Vital signs reviewed and stable  Post vital signs: Reviewed and stable  Last Vitals:  Vitals Value Taken Time  BP    Temp    Pulse 70 09/15/2017  8:09 AM  Resp    SpO2 97 % 09/15/2017  8:09 AM  Vitals shown include unvalidated device data.  Last Pain:  Vitals:   09/15/17 0646  TempSrc: Oral  PainSc: 6          Complications: No apparent anesthesia complications

## 2017-09-15 NOTE — Addendum Note (Signed)
Addended by: Annitta Needs on: 09/15/2017 11:48 AM   Modules accepted: Orders

## 2017-09-15 NOTE — Assessment & Plan Note (Signed)
Multifactorial in setting of decreased oral intake, N/V, no classic signs of chronic mesenteric ischemia. EGD as planned. Close follow-up as outpatient.

## 2017-09-15 NOTE — Assessment & Plan Note (Signed)
Start Linzess 145 mcg daily. Samples provided.

## 2017-09-15 NOTE — Progress Notes (Signed)
cc'ed to pcp °

## 2017-09-15 NOTE — Discharge Instructions (Signed)
EGD Discharge instructions Please read the instructions outlined below and refer to this sheet in the next few weeks. These discharge instructions provide you with general information on caring for yourself after you leave the hospital. Your doctor may also give you specific instructions. While your treatment has been planned according to the most current medical practices available, unavoidable complications occasionally occur. If you have any problems or questions after discharge, please call your doctor. ACTIVITY  You may resume your regular activity but move at a slower pace for the next 24 hours.   Take frequent rest periods for the next 24 hours.   Walking will help expel (get rid of) the air and reduce the bloated feeling in your abdomen.   No driving for 24 hours (because of the anesthesia (medicine) used during the test).   You may shower.   Do not sign any important legal documents or operate any machinery for 24 hours (because of the anesthesia used during the test).  NUTRITION  Drink plenty of fluids.   You may resume your normal diet.   Begin with a light meal and progress to your normal diet.   Avoid alcoholic beverages for 24 hours or as instructed by your caregiver.  MEDICATIONS  You may resume your normal medications unless your caregiver tells you otherwise.  WHAT YOU CAN EXPECT TODAY  You may experience abdominal discomfort such as a feeling of fullness or gas pains.  FOLLOW-UP  Your doctor will discuss the results of your test with you.  SEEK IMMEDIATE MEDICAL ATTENTION IF ANY OF THE FOLLOWING OCCUR:  Excessive nausea (feeling sick to your stomach) and/or vomiting.   Severe abdominal pain and distention (swelling).   Trouble swallowing.   Temperature over 101 F (37.8 C).   Rectal bleeding or vomiting of blood.    Continue Linzess (145) daily and Dexilant 60 mg daily  GERD and constipation information provided  Office visit in 6 weeks  - May  21 @ 11:30  Further recommendations to follow pending review of pathology report  Patient may continue usual daily medications      Gastroesophageal Reflux Disease, Adult Normally, food travels down the esophagus and stays in the stomach to be digested. If a person has gastroesophageal reflux disease (GERD), food and stomach acid move back up into the esophagus. When this happens, the esophagus becomes sore and swollen (inflamed). Over time, GERD can make small holes (ulcers) in the lining of the esophagus. Follow these instructions at home: Diet  Follow a diet as told by your doctor. You may need to avoid foods and drinks such as: ? Coffee and tea (with or without caffeine). ? Drinks that contain alcohol. ? Energy drinks and sports drinks. ? Carbonated drinks or sodas. ? Chocolate and cocoa. ? Peppermint and mint flavorings. ? Garlic and onions. ? Horseradish. ? Spicy and acidic foods, such as peppers, chili powder, curry powder, vinegar, hot sauces, and BBQ sauce. ? Citrus fruit juices and citrus fruits, such as oranges, lemons, and limes. ? Tomato-based foods, such as red sauce, chili, salsa, and pizza with red sauce. ? Fried and fatty foods, such as donuts, french fries, potato chips, and high-fat dressings. ? High-fat meats, such as hot dogs, rib eye steak, sausage, ham, and bacon. ? High-fat dairy items, such as whole milk, butter, and cream cheese.  Eat small meals often. Avoid eating large meals.  Avoid drinking large amounts of liquid with your meals.  Avoid eating meals during the 2-3 hours  before bedtime.  Avoid lying down right after you eat.  Do not exercise right after you eat. General instructions  Pay attention to any changes in your symptoms.  Take over-the-counter and prescription medicines only as told by your doctor. Do not take aspirin, ibuprofen, or other NSAIDs unless your doctor says it is okay.  Do not use any tobacco products, including  cigarettes, chewing tobacco, and e-cigarettes. If you need help quitting, ask your doctor.  Wear loose clothes. Do not wear anything tight around your waist.  Raise (elevate) the head of your bed about 6 inches (15 cm).  Try to lower your stress. If you need help doing this, ask your doctor.  If you are overweight, lose an amount of weight that is healthy for you. Ask your doctor about a safe weight loss goal.  Keep all follow-up visits as told by your doctor. This is important. Contact a doctor if:  You have new symptoms.  You lose weight and you do not know why it is happening.  You have trouble swallowing, or it hurts to swallow.  You have wheezing or a cough that keeps happening.  Your symptoms do not get better with treatment.  You have a hoarse voice. Get help right away if:  You have pain in your arms, neck, jaw, teeth, or back.  You feel sweaty, dizzy, or light-headed.  You have chest pain or shortness of breath.  You throw up (vomit) and your throw up looks like blood or coffee grounds.  You pass out (faint).  Your poop (stool) is bloody or black.  You cannot swallow, drink, or eat. This information is not intended to replace advice given to you by your health care provider. Make sure you discuss any questions you have with your health care provider. Document Released: 11/16/2007 Document Revised: 11/05/2015 Document Reviewed: 09/24/2014 Elsevier Interactive Patient Education  2018 [employer].    Constipation, Adult Constipation is when a person has fewer bowel movements in a week than normal, has difficulty having a bowel movement, or has stools that are dry, hard, or larger than normal. Constipation may be caused by an underlying condition. It may become worse with age if a person takes certain medicines and does not take in enough fluids. Follow these instructions at home: Eating and drinking   Eat foods that have a lot of fiber, such as fresh fruits  and vegetables, whole grains, and beans.  Limit foods that are high in fat, low in fiber, or overly processed, such as french fries, hamburgers, cookies, candies, and soda.  Drink enough fluid to keep your urine clear or pale yellow. General instructions  Exercise regularly or as told by your health care provider.  Go to the restroom when you have the urge to go. Do not hold it in.  Take over-the-counter and prescription medicines only as told by your health care provider. These include any fiber supplements.  Practice pelvic floor retraining exercises, such as deep breathing while relaxing the lower abdomen and pelvic floor relaxation during bowel movements.  Watch your condition for any changes.  Keep all follow-up visits as told by your health care provider. This is important. Contact a health care provider if:  You have pain that gets worse.  You have a fever.  You do not have a bowel movement after 4 days.  You vomit.  You are not hungry.  You lose weight.  You are bleeding from the anus.  You have thin, pencil-like stools.  Get help right away if:  You have a fever and your symptoms suddenly get worse.  You leak stool or have blood in your stool.  Your abdomen is bloated.  You have severe pain in your abdomen.  You feel dizzy or you faint. This information is not intended to replace advice given to you by your health care provider. Make sure you discuss any questions you have with your health care provider. Document Released: 02/26/2004 Document Revised: 12/18/2015 Document Reviewed: 11/18/2015 Elsevier Interactive Patient Education  2018 [place] POST-ANESTHESIA  IMMEDIATELY FOLLOWING SURGERY:  Do not drive or operate machinery for the first twenty four hours after surgery.  Do not make any important decisions for twenty four hours after surgery or while taking narcotic pain medications or sedatives.  If you develop  intractable nausea and vomiting or a severe headache please notify your doctor immediately.  FOLLOW-UP:  Please make an appointment with your surgeon as instructed. You do not need to follow up with anesthesia unless specifically instructed to do so.  WOUND CARE INSTRUCTIONS (if applicable):  Keep a dry clean dressing on the anesthesia/puncture wound site if there is drainage.  Once the wound has quit draining you may leave it open to air.  Generally you should leave the bandage intact for twenty four hours unless there is drainage.  If the epidural site drains for more than 36-48 hours please call the anesthesia department.  QUESTIONS?:  Please feel free to call your physician or the hospital operator if you have any questions, and they will be happy to assist you.

## 2017-09-15 NOTE — Progress Notes (Signed)
Magnesium is low. Still awaiting B1. B12 elevated but expected this as she is taking injections. Mild hypokalemia. Let's check BMP early next week. I am sending in potassium to take Saturday/Sunday. She also needs to take magnesium oxide 400 mg po BID. This might cause loose stool. We need to check Magnesium in 2 weeks.

## 2017-09-15 NOTE — Assessment & Plan Note (Signed)
67 year old female with known history of gastroparesis, unable to take Reglan due to adverse side effects. Continued intermittent nausea and vomiting, with associated weight loss. Occasional solid pill dysphagia. Chronic RUQ pain since cholecystectomy 2016. As last EGD was in Oct 2016, proceed with updated EGD now. Continue Dexilant samples.   Proceed with upper endoscopy and possible dilation in the near future with Dr. [name]. The risks, benefits, and alternatives have been discussed in detail with patient. They have stated understanding and desire to proceed.  Propofol due to polypharmacy

## 2017-09-17 LAB — VITAMIN B12: Vitamin B-12: >2000 pg/mL — ABNORMAL HIGH (ref 200–1100)

## 2017-09-17 LAB — BASIC METABOLIC PANEL WITH GFR
BUN: 12 mg/dL (ref 7–25)
CHLORIDE: 96 mmol/L — AB (ref 98–110)
CO2: 36 mmol/L — ABNORMAL HIGH (ref 20–32)
Calcium: 9.6 mg/dL (ref 8.6–10.4)
Creat: 0.62 mg/dL (ref 0.50–0.99)
GFR, EST AFRICAN AMERICAN: 109 mL/min/{1.73_m2} (ref >=60)
GFR, EST NON AFRICAN AMERICAN: 94 mL/min/{1.73_m2} (ref >=60)
Glucose, Bld: 184 mg/dL — ABNORMAL HIGH (ref 65–139)
POTASSIUM: 3.3 mmol/L — AB (ref 3.5–5.3)
Sodium: 142 mmol/L (ref 135–146)

## 2017-09-17 LAB — MAGNESIUM: MAGNESIUM: 1.3 mg/dL — AB (ref 1.5–2.5)

## 2017-09-17 LAB — VITAMIN B1: Vitamin B1 (Thiamine): 61 nmol/L — ABNORMAL HIGH (ref 8–30)

## 2017-09-20 ENCOUNTER — Other Ambulatory Visit: Payer: Self-pay

## 2017-09-20 DIAGNOSIS — R790 Abnormal level of blood mineral: Secondary | ICD-10-CM

## 2017-09-21 ENCOUNTER — Encounter (HOSPITAL_COMMUNITY): Payer: Self-pay | Admitting: Internal Medicine

## 2017-09-22 ENCOUNTER — Telehealth: Payer: Self-pay

## 2017-09-22 NOTE — Telephone Encounter (Signed)
BMP with Cr 0.72, Potassium 4.9, Magnesium 1.7, improved. Let's recheck Mg in 2 weeks, continue mag oxide 400 mg po BID.

## 2017-09-22 NOTE — Telephone Encounter (Signed)
BMP results placed in anna's box for review. (ordered by Dr. [name]).

## 2017-09-24 ENCOUNTER — Encounter: Payer: Self-pay | Admitting: Internal Medicine

## 2017-09-25 NOTE — Telephone Encounter (Signed)
lmom with ots niece. Waiting on a return call.

## 2017-09-25 NOTE — Telephone Encounter (Signed)
Spoke with pts niece and she was notified of improvement. Labs will be drawn within 2 weeks.

## 2017-10-05 LAB — MAGNESIUM: MAGNESIUM: 2.1 mg/dL (ref 1.5–2.5)

## 2017-10-10 NOTE — Progress Notes (Signed)
Magnesium now normal. Let's see how she does off Magnesium. Recheck in 2 weeks.

## 2017-10-11 ENCOUNTER — Other Ambulatory Visit: Payer: Self-pay

## 2017-10-11 DIAGNOSIS — R790 Abnormal level of blood mineral: Secondary | ICD-10-CM

## 2017-10-25 LAB — MAGNESIUM: Magnesium: 1.6 mg/dL (ref 1.5–2.5)

## 2017-10-31 ENCOUNTER — Ambulatory Visit: Payer: Medicare Other | Admitting: Gastroenterology

## 2017-11-01 ENCOUNTER — Encounter: Payer: Self-pay | Admitting: Gastroenterology

## 2017-11-01 ENCOUNTER — Ambulatory Visit (INDEPENDENT_AMBULATORY_CARE_PROVIDER_SITE_OTHER): Payer: Medicare Other | Admitting: Gastroenterology

## 2017-11-01 VITALS — BP 135/77 | HR 72 | Temp 97.7°F | Ht 63.0 in | Wt 158.6 lb

## 2017-11-01 DIAGNOSIS — K3184 Gastroparesis: Secondary | ICD-10-CM

## 2017-11-01 DIAGNOSIS — E1143 Type 2 diabetes mellitus with diabetic autonomic (poly)neuropathy: Secondary | ICD-10-CM | POA: Diagnosis not present

## 2017-11-01 DIAGNOSIS — K5900 Constipation, unspecified: Secondary | ICD-10-CM | POA: Diagnosis not present

## 2017-11-01 NOTE — Progress Notes (Signed)
Magnesium low normal. Needs to resume 400 mg BID. Will be seeing her today, 5/22.

## 2017-11-01 NOTE — Progress Notes (Signed)
Primary Care Physician:  Monico Blitz, MD Primary GI: Dr. [name]   Chief Complaint  Patient presents with  . Follow-up    PP F/U.  [name] Emesis    not has often and seems to happen when she is nervous    HPI:   Jenna Maldonado is a 67 y.o. female presenting today with a history of  chronic constipation, gastroparesis. Taken off Reglan in March 2019 due to adverse side effects (Tardive dyskinesia). Phenergan discontinued. Underwent recent EGD April 2019 with normal esophagus s/p dilation, abnormal mucosa congested appearing, with some snake skinning appearance, patent pylorus, normal duodenum. Reactive gastropathy, hyperplastic polyps.   Weight loss noted, high of 184 in March 2018, steadily decreasing since Oct 2018 and down to low of 157 April 2019. Today, she is 158. Marland KitchenNo classic signs of chronic mesenteric ischemia. CT 2017 with abundant stool in colon. Due to constipation exacerbating N/V, started on Linzess 145 mcg daily. On magnesium supplementation due to low magnesium.   Pill dysphagia with big pills. No abdominal pain. Apetite is "alright" I guess. Not vomiting as much. States she has had a few occasions where she is feeling like she is going to pass out. Constipation noted with Miralax and prune juice not helping.   Past Medical History:  Diagnosis Date  . 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 Calculus of kidney    L inferior kidney ; non obstructing  . Chronic constipation   . Chronic headaches   . Complication of anesthesia   . Depression   . Diabetic autonomic neuropathy ([place])   . Diabetic neuropathy ([place])   . Esophageal reflux   . Hypothyroidism, adult   . Orthostatic hypotension   . PONV (postoperative nausea and vomiting)   . Residual foreign body in soft tissue   . 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 Shortness of breath     Past Surgical History:  Procedure Laterality Date  . BIOPSY  09/15/2017   Procedure: BIOPSY;   Surgeon: Daneil Dolin, MD;  [facility];  Service: Endoscopy;;  gastric biopsy , gastric polyp biopsy  . carpel tunnel    . CHOLECYSTECTOMY  11/2014   June 2016  . ESOPHAGOGASTRODUODENOSCOPY  03/2015   Dr. Britta Mccreedy: hiatal hernia, gastritis  . ESOPHAGOGASTRODUODENOSCOPY (EGD) WITH PROPOFOL N/A 09/15/2017   Procedure: ESOPHAGOGASTRODUODENOSCOPY (EGD) WITH PROPOFOL;  Surgeon: Daneil Dolin, MD;  [facility];  Service: Endoscopy;  Laterality: N/A;  7:30am  . Eye Surgrey Bilateral    4-5 times for macular degeneration  . HAND SURGERY Bilateral 2001  . MALONEY DILATION N/A 09/15/2017   Procedure: Venia Minks DILATION;  Surgeon: Daneil Dolin, MD;  [facility];  Service: Endoscopy;  Laterality: N/A;  . SHOULDER SURGERY Left   . TUBAL LIGATION      Current Outpatient Medications  Medication Sig Dispense Refill  . aspirin EC 81 MG tablet Take 81 mg by mouth daily.    [name] atorvastatin (LIPITOR) 80 MG tablet Take 1 tablet (80 mg total) by mouth daily. 30 tablet 5  . carbamazepine (TEGRETOL XR) 100 MG 12 hr tablet Take 300 mg by mouth 2 (two) times daily.     [name] dexlansoprazole (DEXILANT) 60 MG capsule Take 1 capsule (60 mg total) by mouth daily. 90 capsule 3  . FLUoxetine (PROZAC) 40 MG capsule Take 40 mg by mouth daily.     . insulin degludec (  TRESIBA FLEXTOUCH) 100 UNIT/ML SOPN FlexTouch Pen Inject 15-25 Units into the skin at bedtime.     [name] levothyroxine (SYNTHROID, LEVOTHROID) 50 MCG tablet Take 50 mcg by mouth daily before breakfast.    . nystatin (NYSTATIN) powder Apply 1 g topically daily. Use daily after bath.    . ondansetron (ZOFRAN) 4 MG tablet Take 1 tablet (4 mg total) by mouth every 6 (six) hours as needed for nausea or vomiting. 60 tablet 11  . senna (SENOKOT) 8.6 MG tablet Take 1 tablet by mouth daily as needed for constipation. As needed  3  . cyanocobalamin (,VITAMIN B-12,) 1000 MCG/ML injection INJECT 1ML INTO MUSCLE DAILY X 7 DAYS, THEN 1 ML ONCE  WEEKLY X 4 WEEKS, THEN 1 ML MONTHLY  0  . fludrocortisone (FLORINEF) 0.1 MG tablet Take 2 tablets (0.2 mg total) by mouth daily. (Patient not taking: Reported on 11/01/2017) 30 tablet 0  . potassium chloride (K-DUR) 10 MEQ tablet Take 2 tablets (20 mEq total) by mouth 2 (two) times daily for 2 days. (Patient not taking: Reported on 11/01/2017) 8 tablet 0   No current facility-administered medications for this visit.     Allergies as of 11/01/2017 - Review Complete 11/01/2017  Allergen Reaction Noted  . Atorvastatin Other (See Comments) 10/14/2015  . Codeine Nausea And Vomiting 01/02/2013  . Flagyl [metronidazole] Itching 10/14/2015  . Hydrocodone Nausea And Vomiting 10/14/2015  . Phenergan [promethazine hcl]  08/11/2017  . Pioglitazone Other (See Comments) 10/14/2015  . Reglan [metoclopramide]  08/11/2017  . Sulfa antibiotics Other (See Comments) 10/14/2015  . Victoza [liraglutide]  04/03/2017  . Ciprofloxacin Itching and Rash 10/14/2015    Family History  Problem Relation Age of Onset  . Cancer Mother   . Diabetes Mother   . Suicidality Father        GSW  . Diabetes Sister        3 sisters  . Heart disease Brother        MI  . Diabetes Brother        2 brothers  . Stroke Neg Hx     Social History   Socioeconomic History  . Marital status: Married    Spouse name: Laverna Peace  . Number of children: 2  . Years of education: 73  . Highest education level: Not on file  Occupational History  . Occupation: N/A  Social Needs  . Financial resource strain: Not on file  . Food insecurity:    Worry: Not on file    Inability: Not on file  . Transportation needs:    Medical: Not on file    Non-medical: Not on file  Tobacco Use  . Smoking status: Never Smoker  . Smokeless tobacco: Never Used  Substance and Sexual Activity  . Alcohol use: No  . Drug use: No  . Sexual activity: Not on file  Lifestyle  . Physical activity:    Days per week: Not on file    Minutes per session:  Not on file  . Stress: Not on file  Relationships  . Social connections:    Talks on phone: Not on file    Gets together: Not on file    Attends religious service: Not on file    Active member of club or organization: Not on file    Attends meetings of clubs or organizations: Not on file    Relationship status: Not on file  Other Topics Concern  . Not on file  Social History Narrative  01/15/16 Patient lives with daughter, Joseph Art   Caffeine Use: occasionally    Review of Systems: Gen: Denies fever, chills, anorexia. Denies fatigue, weakness, weight loss.  CV: Denies chest pain, palpitations, syncope, peripheral edema, and claudication. Resp: Denies dyspnea at rest, cough, wheezing, coughing up blood, and pleurisy. GI: Denies vomiting blood, jaundice, and fecal incontinence.   Denies dysphagia or odynophagia. Derm: Denies rash, itching, dry skin Psych: Denies depression, anxiety, memory loss, confusion. No homicidal or suicidal ideation.  Heme: Denies bruising, bleeding, and enlarged lymph nodes.  Physical Exam: BP 135/77   Pulse 72   Temp 97.7 F (36.5 C) (Oral)   Ht 5\' 3"  (1.6 m)   Wt 158 lb 9.6 oz (71.9 kg)   BMI 28.09 kg/m  General:   Alert and oriented. No distress noted. Pleasant and cooperative.  Head:  Normocephalic and atraumatic. Eyes:  Conjuctiva clear without scleral icterus. Mouth:  Oral mucosa pink and moist. Good dentition. No lesions. Abdomen:  +BS, soft, non-tender and non-distended. No rebound or guarding. No HSM or masses noted. Msk:  Symmetrical without gross deformities. Normal posture. Extremities:  Without edema. Neurologic:  Alert and  oriented x4 Psych:  Alert and cooperative. Normal mood and affect.

## 2017-11-01 NOTE — Patient Instructions (Signed)
I have given you samples of Amitiza to take twice a day with food ( to avoid nausea). This is for constipation. Let me know how this does!  Continue Dexilant daily. Take the magnesium twice a day.  I will see you in 3 months!  It was a pleasure to see you today. I strive to create trusting relationships with patients to provide genuine, compassionate, and quality care. I value your feedback. If you receive a survey regarding your visit,  I greatly appreciate you taking time to fill this out.   Annitta Needs, PhD, ANP-BC [facility]

## 2017-11-06 NOTE — Assessment & Plan Note (Signed)
Constipation not ideally managed. Previously on Linzess. Start Amitiza 8 mcg BID. Return in 3 months.

## 2017-11-06 NOTE — Assessment & Plan Note (Signed)
Adverse side effect noted with Reglan. EGD April 2019 with normal esophagus s/p dilation, reactive gastropathy, hyperplastic polyps. Improvement in vomiting. Still with some pill dysphagia with large pills, otherwise improved. Continue Dexilant daily. Will continue to follow closely in office.

## 2017-11-07 NOTE — Progress Notes (Signed)
cc'd to pcp 

## 2018-01-12 ENCOUNTER — Other Ambulatory Visit: Payer: Self-pay | Admitting: Neurology

## 2018-02-05 ENCOUNTER — Ambulatory Visit: Payer: Medicare Other | Admitting: Gastroenterology

## 2018-05-21 ENCOUNTER — Encounter: Payer: Self-pay | Admitting: Gastroenterology

## 2018-05-21 ENCOUNTER — Ambulatory Visit (INDEPENDENT_AMBULATORY_CARE_PROVIDER_SITE_OTHER): Payer: Medicare Other | Admitting: Gastroenterology

## 2018-05-21 ENCOUNTER — Other Ambulatory Visit: Payer: Self-pay

## 2018-05-21 VITALS — BP 146/75 | HR 74 | Temp 97.2°F | Ht 62.0 in | Wt 166.4 lb

## 2018-05-21 DIAGNOSIS — N939 Abnormal uterine and vaginal bleeding, unspecified: Secondary | ICD-10-CM | POA: Diagnosis not present

## 2018-05-21 DIAGNOSIS — R1312 Dysphagia, oropharyngeal phase: Secondary | ICD-10-CM

## 2018-05-21 DIAGNOSIS — K5900 Constipation, unspecified: Secondary | ICD-10-CM

## 2018-05-21 DIAGNOSIS — E1143 Type 2 diabetes mellitus with diabetic autonomic (poly)neuropathy: Secondary | ICD-10-CM | POA: Diagnosis not present

## 2018-05-21 DIAGNOSIS — K3184 Gastroparesis: Secondary | ICD-10-CM

## 2018-05-21 NOTE — Assessment & Plan Note (Signed)
New onset. Mother with history of cervical cancer. Refer to Derrek Monaco, NP.

## 2018-05-21 NOTE — Assessment & Plan Note (Signed)
Notes choking on saliva at times, getting "strangled". Pursue BPE in near future. Recent EGD with empiric dilation completed in April 2019.

## 2018-05-21 NOTE — Assessment & Plan Note (Signed)
Much improved. Continue supportive measures.

## 2018-05-21 NOTE — Progress Notes (Signed)
Referring Provider: Monico Blitz, MD Primary Care Physician:  Monico Blitz, MD  Primary GI: Dr. [name]   Chief Complaint  Patient presents with  . gastroparesis    still have occas vomiting  . Constipation    HPI:   Jenna Maldonado is a 67 y.o. female presenting today with a history of chronic constipation, gastroparesis. Taken off Reglan in March 2019 due to adverse side effects (Tardive dyskinesia). Phenergan discontinued. Underwent recent EGD April 2019 with normal esophagus s/p dilation, abnormal mucosa congested appearing, with some snake skinning appearance, patent pylorus, normal duodenum. Reactive gastropathy, hyperplastic polyps.   Some concern for weight loss raised at last visit. Was high of 184 in March 2018, steadily decreasing to Oct 2018 and a low of 157 in April 2019. She is now 166.   Vomiting every 2 weeks. Much improved overall per patient. GERD controlled with Dexilant daily.   Constipation: Linzess not effective. Started Amitiza 8 mg BID at last visit. BM every 6-7 days. Not taking anything but stool softener.   Gets strangled on saliva. Pill dysphagia in the past. Recent EGD on file s/p empiric dilation.   At end of visit, she notes new onset low volume vaginal spotting and mucus. She states her mother had a history of cervical cancer. No established care with GYN.   Past Medical History:  Diagnosis Date  . 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 Calculus of kidney    L inferior kidney ; non obstructing  . Chronic constipation   . Chronic headaches   . Complication of anesthesia   . Depression   . Diabetic autonomic neuropathy ([place])   . Diabetic neuropathy ([place])   . Esophageal reflux   . Hypothyroidism, adult   . Orthostatic hypotension   . PONV (postoperative nausea and vomiting)   . Residual foreign body in soft tissue   . 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 Shortness of breath     Past Surgical History:    Procedure Laterality Date  . BIOPSY  09/15/2017   Procedure: BIOPSY;  Surgeon: Daneil Dolin, MD;  [facility];  Service: Endoscopy;;  gastric biopsy , gastric polyp biopsy  . carpel tunnel    . CHOLECYSTECTOMY  11/2014   June 2016  . ESOPHAGOGASTRODUODENOSCOPY  03/2015   Dr. Britta Mccreedy: hiatal hernia, gastritis  . ESOPHAGOGASTRODUODENOSCOPY (EGD) WITH PROPOFOL N/A 09/15/2017    normal esophagus s/p dilation, abnormal mucosa congested appearing, with some snake skinning appearance, patent pylorus, normal duodenum. Reactive gastropathy, hyperplastic polyps.   . Eye Surgrey Bilateral    4-5 times for macular degeneration  . HAND SURGERY Bilateral 2001  . MALONEY DILATION N/A 09/15/2017   Procedure: Venia Minks DILATION;  Surgeon: Daneil Dolin, MD;  [facility];  Service: Endoscopy;  Laterality: N/A;  . SHOULDER SURGERY Left   . TUBAL LIGATION      Current Outpatient Medications  Medication Sig Dispense Refill  . aspirin EC 81 MG tablet Take 81 mg by mouth daily.    [name] atorvastatin (LIPITOR) 80 MG tablet Take 1 tablet (80 mg total) by mouth daily. 30 tablet 5  . carbamazepine (TEGRETOL XR) 100 MG 12 hr tablet Take 300 mg by mouth 2 (two) times daily.     [name] dexlansoprazole (DEXILANT) 60 MG capsule Take 1 capsule (60 mg total) by mouth daily. 90 capsule 3  . FLUoxetine (PROZAC) 40 MG capsule Take 40 mg by  mouth daily.     . insulin degludec (TRESIBA FLEXTOUCH) 100 UNIT/ML SOPN FlexTouch Pen Inject 20 Units into the skin at bedtime.     [name] levothyroxine (SYNTHROID, LEVOTHROID) 50 MCG tablet Take 50 mcg by mouth daily before breakfast.    . nystatin (NYSTATIN) powder Apply 1 g topically daily. Use daily after bath.    . ondansetron (ZOFRAN) 4 MG tablet Take 1 tablet (4 mg total) by mouth every 6 (six) hours as needed for nausea or vomiting. 60 tablet 11  . senna (SENOKOT) 8.6 MG tablet Take 1 tablet by mouth daily as needed for constipation. As needed  3   No current  facility-administered medications for this visit.     Allergies as of 05/21/2018 - Review Complete 05/21/2018  Allergen Reaction Noted  . Atorvastatin Other (See Comments) 10/14/2015  . Codeine Nausea And Vomiting 01/02/2013  . Flagyl [metronidazole] Itching 10/14/2015  . Hydrocodone Nausea And Vomiting 10/14/2015  . Phenergan [promethazine hcl]  08/11/2017  . Pioglitazone Other (See Comments) 10/14/2015  . Reglan [metoclopramide]  08/11/2017  . Sulfa antibiotics Other (See Comments) 10/14/2015  . Victoza [liraglutide]  04/03/2017  . Ciprofloxacin Itching and Rash 10/14/2015    Family History  Problem Relation Age of Onset  . Cancer Mother   . Diabetes Mother   . Suicidality Father        GSW  . Diabetes Sister        3 sisters  . Heart disease Brother        MI  . Diabetes Brother        2 brothers  . Stroke Neg Hx     Social History   Socioeconomic History  . Marital status: Married    Spouse name: Laverna Peace  . Number of children: 2  . Years of education: 102  . Highest education level: Not on file  Occupational History  . Occupation: N/A  Social Needs  . Financial resource strain: Not on file  . Food insecurity:    Worry: Not on file    Inability: Not on file  . Transportation needs:    Medical: Not on file    Non-medical: Not on file  Tobacco Use  . Smoking status: Never Smoker  . Smokeless tobacco: Never Used  Substance and Sexual Activity  . Alcohol use: No  . Drug use: No  . Sexual activity: Not on file  Lifestyle  . Physical activity:    Days per week: Not on file    Minutes per session: Not on file  . Stress: Not on file  Relationships  . Social connections:    Talks on phone: Not on file    Gets together: Not on file    Attends religious service: Not on file    Active member of club or organization: Not on file    Attends meetings of clubs or organizations: Not on file    Relationship status: Not on file  Other Topics Concern  . Not on file    Social History Narrative   01/15/16 Patient lives with daughter, Joseph Art   Caffeine Use: occasionally    Review of Systems: Gen: Denies fever, chills, anorexia. Denies fatigue, weakness, weight loss.  CV: Denies chest pain, palpitations, syncope, peripheral edema, and claudication. Resp: Denies dyspnea at rest, cough, wheezing, coughing up blood, and pleurisy. GI: see HPI  Derm: Denies rash, itching, dry skin Psych: Denies depression, anxiety, memory loss, confusion. No homicidal or suicidal ideation.  Heme: Denies bruising,  bleeding, and enlarged lymph nodes.  Physical Exam: BP (!) 146/75   Pulse 74   Temp (!) 97.2 F (36.2 C) (Oral)   Ht 5\' 2"  (1.575 m)   Wt 166 lb 6.4 oz (75.5 kg)   BMI 30.43 kg/m  General:   Alert and oriented. No distress noted. Pleasant and cooperative.  Head:  Normocephalic and atraumatic. Eyes:  Conjuctiva clear without scleral icterus. Mouth:  Oral mucosa pink and moist.  Abdomen:  +BS, soft, non-tender and non-distended. No rebound or guarding. No HSM or masses noted. Msk:  Symmetrical without gross deformities. Normal posture. Extremities:  Without edema. Neurologic:  Alert and  oriented x4 Psych:  Alert and cooperative. Normal mood and affect.

## 2018-05-21 NOTE — Patient Instructions (Signed)
I would like for you to try the new samples of Amitiza at the higher dosing. Take this twice a day with food. Let me know how this works for you, and I can send in a prescription.  I have ordered an xray of your esophagus.  I am also referring you to Gynecology as we discussed.  We will see you in 3 months!  I enjoyed seeing you again today! As you know, I value our relationship and want to provide genuine, compassionate, and quality care. I welcome your feedback. If you receive a survey regarding your visit,  I greatly appreciate you taking time to fill this out. See you next time!  Annitta Needs, PhD, ANP-BC [facility]

## 2018-05-21 NOTE — Assessment & Plan Note (Signed)
Increase to Amitiza 24 mcg BID. Samples provided. Patient to call with progress report. Return in 3 months.

## 2018-05-22 NOTE — Progress Notes (Signed)
cc'ed to pcp °

## 2018-05-25 ENCOUNTER — Ambulatory Visit (HOSPITAL_COMMUNITY)
Admission: RE | Admit: 2018-05-25 | Discharge: 2018-05-25 | Disposition: A | Discharge status: Home / Self Care | Payer: Medicare Other | Source: Ambulatory Visit | Attending: Gastroenterology | Admitting: Gastroenterology

## 2018-05-25 DIAGNOSIS — R1312 Dysphagia, oropharyngeal phase: Secondary | ICD-10-CM | POA: Insufficient documentation

## 2018-05-31 NOTE — Progress Notes (Signed)
Diffuse impairment of esophageal motility, which is age-appropriate. This is likely the culprit for intermittent pill dysphagia, sensation of choking at times. EGD would not likely help, as she recently had one. Would sit upright while eating, do not lay down at least 3 hours after eating. Take small bites, chew very well. I will see her in 3 months!

## 2018-06-04 ENCOUNTER — Encounter: Payer: Self-pay | Admitting: Internal Medicine

## 2018-06-04 NOTE — Progress Notes (Signed)
PATIENT SCHEDULED AND LETTER SENT  °

## 2018-06-11 ENCOUNTER — Ambulatory Visit (INDEPENDENT_AMBULATORY_CARE_PROVIDER_SITE_OTHER): Payer: Medicare Other | Admitting: Adult Health

## 2018-06-11 ENCOUNTER — Other Ambulatory Visit (HOSPITAL_COMMUNITY)
Admission: RE | Admit: 2018-06-11 | Discharge: 2018-06-11 | Disposition: A | Discharge status: Home / Self Care | Payer: Medicare Other | Source: Ambulatory Visit | Attending: Adult Health | Admitting: Adult Health

## 2018-06-11 ENCOUNTER — Encounter: Payer: Self-pay | Admitting: Adult Health

## 2018-06-11 ENCOUNTER — Other Ambulatory Visit: Payer: Self-pay

## 2018-06-11 VITALS — BP 112/63 | HR 77 | Ht 63.0 in | Wt 167.0 lb

## 2018-06-11 DIAGNOSIS — Z01419 Encounter for gynecological examination (general) (routine) without abnormal findings: Secondary | ICD-10-CM | POA: Insufficient documentation

## 2018-06-11 DIAGNOSIS — N950 Postmenopausal bleeding: Secondary | ICD-10-CM | POA: Diagnosis not present

## 2018-06-11 NOTE — Patient Instructions (Signed)
Postmenopausal Bleeding    Postmenopausal bleeding is any bleeding that happens after menopause. Menopause is when a woman's period stops. Any type of bleeding after menopause should be checked by your doctor. Treatment will depend on the cause.  Follow these instructions at home:   Pay attention to any changes in your symptoms.   Avoid using tampons and douches as told by your doctor.   Change your pads regularly.   Get regular pelvic exams and Pap tests.   Take iron pills as told by your doctor.   Take over-the-counter and prescription medicines only as told by your doctor.   Keep all follow-up visits as told by your doctor. This is important.  Contact a doctor if:   Your bleeding lasts for more than 1 week.   You have belly (abdominal) pain.   You have bleeding during or after sex (intercourse).   You have bleeding that happens more often than every 3 weeks.  Get help right away if:   You have a fever, chills, a headache, dizziness, muscle aches, and bleeding.   You have strong pain with bleeding.   You have clumps of blood (blood clots) coming from your vagina.   You have a lot of bleeding and:  ? You need more than 1 pad an hour.  ? This has never happened before.   You feel like you are going to pass out (faint).  Summary   Any type of bleeding after menopause should be checked by your doctor.   Pay attention to any changes in your symptoms.   Keep all follow-up visits as told by your doctor.  This information is not intended to replace advice given to you by your health care provider. Make sure you discuss any questions you have with your health care provider.  Document Released: 03/08/2008 Document Revised: 07/05/2016 Document Reviewed: 07/05/2016  Elsevier Interactive Patient Education  2019 Elsevier Inc[employer]

## 2018-06-11 NOTE — Progress Notes (Addendum)
Patient ID: Jenna Maldonado, female   DOB: Feb 05, 1951, 67 y.o.   MRN: [id number] History of Present Illness: Jenna Maldonado is a 67 year old white female, separated,PM,  in for vaginal bleeding.Her niece Jenna Maldonado is with her. PCP is Dr Jenna Maldonado.   Current Medications, Allergies, Past Medical History, Past Surgical History, Family History and Social History were reviewed in [employer] record.     Review of Systems: +vaginal bleeding on and off for about 3 months +constipation, chronic, is on meds,she sees Dr Jenna Maldonado  +mixed UI, wears depends  She is not sexually active.  She does not remember last pap    Physical Exam:BP 112/63 (BP Location: Right Arm, Patient Position: Sitting, Cuff Size: Normal)   Pulse 77   Ht 5\' 3"  (1.6 m)   Wt 167 lb (75.8 kg)   BMI 29.58 kg/m  General:  Well developed, well nourished, no acute distress Skin:  Warm and dry Lungs; Clear to auscultation bilaterally Cardiovascular: Regular rate and rhythm Abdomen:  Soft, non tender, no hepatosplenomegaly Pelvic:  External genitalia is normal in appearance, has hair bump.  The vagina has of color, moisture and rugae. Urethra has no lesions or masses. The cervix is poorly visualized, pap with HPV performed.  Uterus is felt to be normal size, shape, and contour.  No adnexal masses or tenderness noted.Bladder is non tender, no masses felt. Psych:  No mood changes, alert and cooperative,seems happy Fall risk is moderate, uses cane. PHQ 9 score 14, is on prozac, denies being suicidal.  Examination chaperoned by Jenna Nevin RN.   Impression:  1. PMB (postmenopausal bleeding)   2. Encounter for gynecological examination with Papanicolaou smear of cervix      Plan: Pap with HPV sent GYN [place] to assess uterus ASAP, will try next week

## 2018-06-15 LAB — CYTOLOGY - PAP
ADEQUACY: ABSENT
Diagnosis: NEGATIVE
HPV (WINDOPATH): NOT DETECTED

## 2018-06-20 ENCOUNTER — Ambulatory Visit (INDEPENDENT_AMBULATORY_CARE_PROVIDER_SITE_OTHER): Payer: Medicare Other

## 2018-06-20 DIAGNOSIS — N950 Postmenopausal bleeding: Secondary | ICD-10-CM

## 2018-06-20 NOTE — Progress Notes (Signed)
PELVIC US TA/TV: homogeneous anteverted uterus,wnl,EEC 2.2 mm,mult simple nabothian cysts,normal ovaries bilat,no pain during ultrasound

## 2018-06-25 ENCOUNTER — Telehealth: Payer: Self-pay | Admitting: Adult Health

## 2018-06-25 NOTE — Telephone Encounter (Signed)
Jenna Maldonado is aware that [place] was normal as was pap, try using wet wipes when going to bathroom and if happens again, let me know.

## 2018-07-14 ENCOUNTER — Other Ambulatory Visit: Payer: Self-pay | Admitting: Neurology

## 2018-08-07 ENCOUNTER — Other Ambulatory Visit: Payer: Self-pay | Admitting: Neurology

## 2018-08-30 ENCOUNTER — Telehealth: Payer: Self-pay | Admitting: Gastroenterology

## 2018-08-30 ENCOUNTER — Telehealth (INDEPENDENT_AMBULATORY_CARE_PROVIDER_SITE_OTHER): Payer: Medicare Other | Admitting: Gastroenterology

## 2018-08-30 ENCOUNTER — Encounter: Payer: Self-pay | Admitting: Gastroenterology

## 2018-08-30 DIAGNOSIS — K5900 Constipation, unspecified: Secondary | ICD-10-CM | POA: Diagnosis not present

## 2018-08-30 NOTE — Progress Notes (Signed)
Opened in error

## 2018-08-30 NOTE — Telephone Encounter (Signed)
Virtual Visit via Telephone    Time: 11:30 Patient Consent: Yes Individuals Present and Roles: Patient, Jenna Maldonado. Niece, Jenna Maldonado Primary GI: Dr. [name]   Reason for Visit/CC: Follow-up constipation, GERD, gastroparesis, dysphagia    HPI: Jenna Maldonado is a pleasant 68 year old female with a history of chronic constipation, gastroparesis. dysphagia. Taken off Reglan in March 2019 due to adverse side effects (Tardive dyskinesia). Phenergan discontinued. Underwent EGD April 2019 with normal esophagus s/p dilation, abnormal mucosa congested appearing, with some snake skinning appearance, patent pylorus, normal duodenum. Reactive gastropathy, hyperplastic polyps. She was last seen in Dec 2019. Routine face-to-face office visit had to be postponed due to COVID-19. She has agreed to a telephone encounter to review current symptoms. Niece is also at home, present for phone call.   Friday had a bad spell vomiting but now resolved. Appetite somewhat poor. Food doesn't want to go down sometimes. Pill dysphagia at times. Dysphagia s/p dilation only lasted about a month. BPE with esophageal dysmotility documented recently.  Dexilant daily, which is working well. . Amitiza samples provided at last visit didn't work like she thought it would. Has been taking a "white liquid" and niece states this is dulcolax.  Taking as needed, about once a week, when she feels she is uncomfortable and hasn't had a BM. Waits "until too late" then will take it, resulting in diarrhea. Linzess was ineffective historically.  GERD overall well-controlled. Requesting refills of Dexilant.   Past Medical History:  Diagnosis Date  . 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 Calculus of kidney    L inferior kidney ; non obstructing  . Chronic constipation   . Chronic headaches   . Complication of anesthesia   . Depression   . Diabetic autonomic neuropathy ([place])   . Diabetic neuropathy ([place])   . Esophageal  reflux   . Hypothyroidism, adult   . Orthostatic hypotension   . PONV (postoperative nausea and vomiting)   . Residual foreign body in soft tissue   . 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 Shortness of breath    Past Surgical History:  Procedure Laterality Date  . BIOPSY  09/15/2017   Procedure: BIOPSY;  Surgeon: Daneil Dolin, MD;  [facility];  Service: Endoscopy;;  gastric biopsy , gastric polyp biopsy  . carpel tunnel    . CHOLECYSTECTOMY  11/2014   June 2016  . ESOPHAGOGASTRODUODENOSCOPY  03/2015   Dr. Britta Mccreedy: hiatal hernia, gastritis  . ESOPHAGOGASTRODUODENOSCOPY (EGD) WITH PROPOFOL N/A 09/15/2017    normal esophagus s/p dilation, abnormal mucosa congested appearing, with some snake skinning appearance, patent pylorus, normal duodenum. Reactive gastropathy, hyperplastic polyps.   . Eye Surgrey Bilateral    4-5 times for macular degeneration  . HAND SURGERY Bilateral 2001  . MALONEY DILATION N/A 09/15/2017   Procedure: Venia Minks DILATION;  Surgeon: Daneil Dolin, MD;  [facility];  Service: Endoscopy;  Laterality: N/A;  . SHOULDER SURGERY Left   . TUBAL LIGATION      Assessment: 68 year old female with gastroparesis, constipation, and dysphagia, overall near baseline for chronic symptoms.  Gastroparesis: rare vomiting. No alarm signs/symptoms.   Constipation: not ideally managed, failing Linzess and Amitiza. Laxative therapy unpredictable.   Dysphagia: at baseline. Only brief improvement s/p dilatation last year. BPE on file with dysmotility.   Plan:  Will trial Motegrity 2 mg once daily. Samples have been gathered for patient, and niece will pick-up at some point today.  Take 1 hour before or 2 hours after breakfast.  Dexilant refilled Chew well, take small bites, sit upright while eating Will see patient in office in future, appt to be arranged. Call with update on Motegrity and will send in prescription if appropriate.       Jenna Needs, PhD, ANP-BC [facility]   Medical discussion time: 10 minutes

## 2018-08-30 NOTE — Telephone Encounter (Signed)
Samples of Motegrity 2 mg were logged in the medicine log and left up front for pickup, ok per AB.

## 2018-09-03 ENCOUNTER — Ambulatory Visit: Payer: Medicare Other | Admitting: Gastroenterology

## 2018-09-08 ENCOUNTER — Other Ambulatory Visit: Payer: Self-pay | Admitting: Neurology

## 2018-09-08 ENCOUNTER — Other Ambulatory Visit: Payer: Self-pay | Admitting: Internal Medicine

## 2018-09-08 DIAGNOSIS — R112 Nausea with vomiting, unspecified: Secondary | ICD-10-CM

## 2018-09-11 NOTE — Telephone Encounter (Signed)
Addendum: visit initiated by patient.

## 2018-10-14 ENCOUNTER — Other Ambulatory Visit: Payer: Self-pay | Admitting: Gastroenterology

## 2018-10-14 DIAGNOSIS — E1143 Type 2 diabetes mellitus with diabetic autonomic (poly)neuropathy: Secondary | ICD-10-CM

## 2018-10-14 DIAGNOSIS — K3184 Gastroparesis: Principal | ICD-10-CM

## 2018-12-14 ENCOUNTER — Other Ambulatory Visit: Payer: Self-pay | Admitting: Neurology

## 2019-01-14 ENCOUNTER — Other Ambulatory Visit: Payer: Self-pay | Admitting: Neurology

## 2019-01-30 IMAGING — DX DG RIBS 2V*R*
3 series · 3 of 3 positions shown · non-contrast
Comparison: Prior radiograph from 11/10/2016.

CLINICAL DATA: Initial evaluation for acute right rib pain, recent
fall.

EXAM:
RIGHT RIBS - 2 VIEW

[rib pa (1 of 2)]
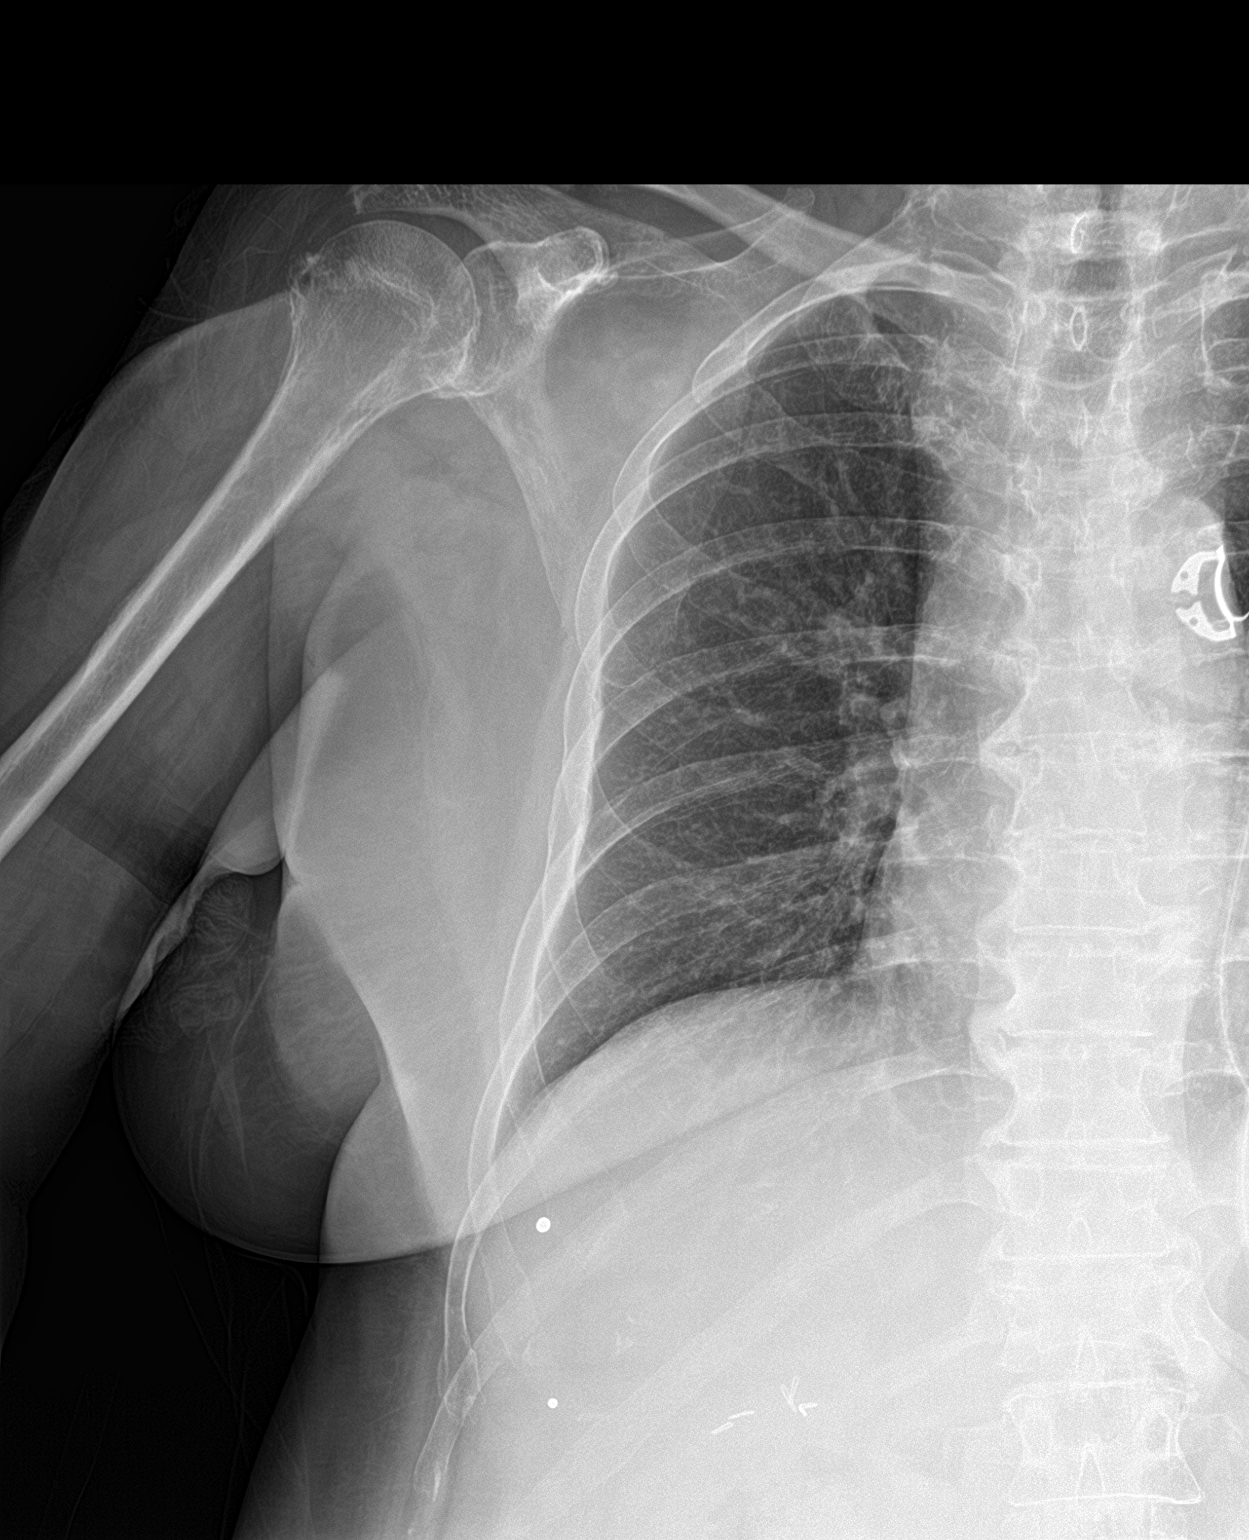

[rib pa obl]
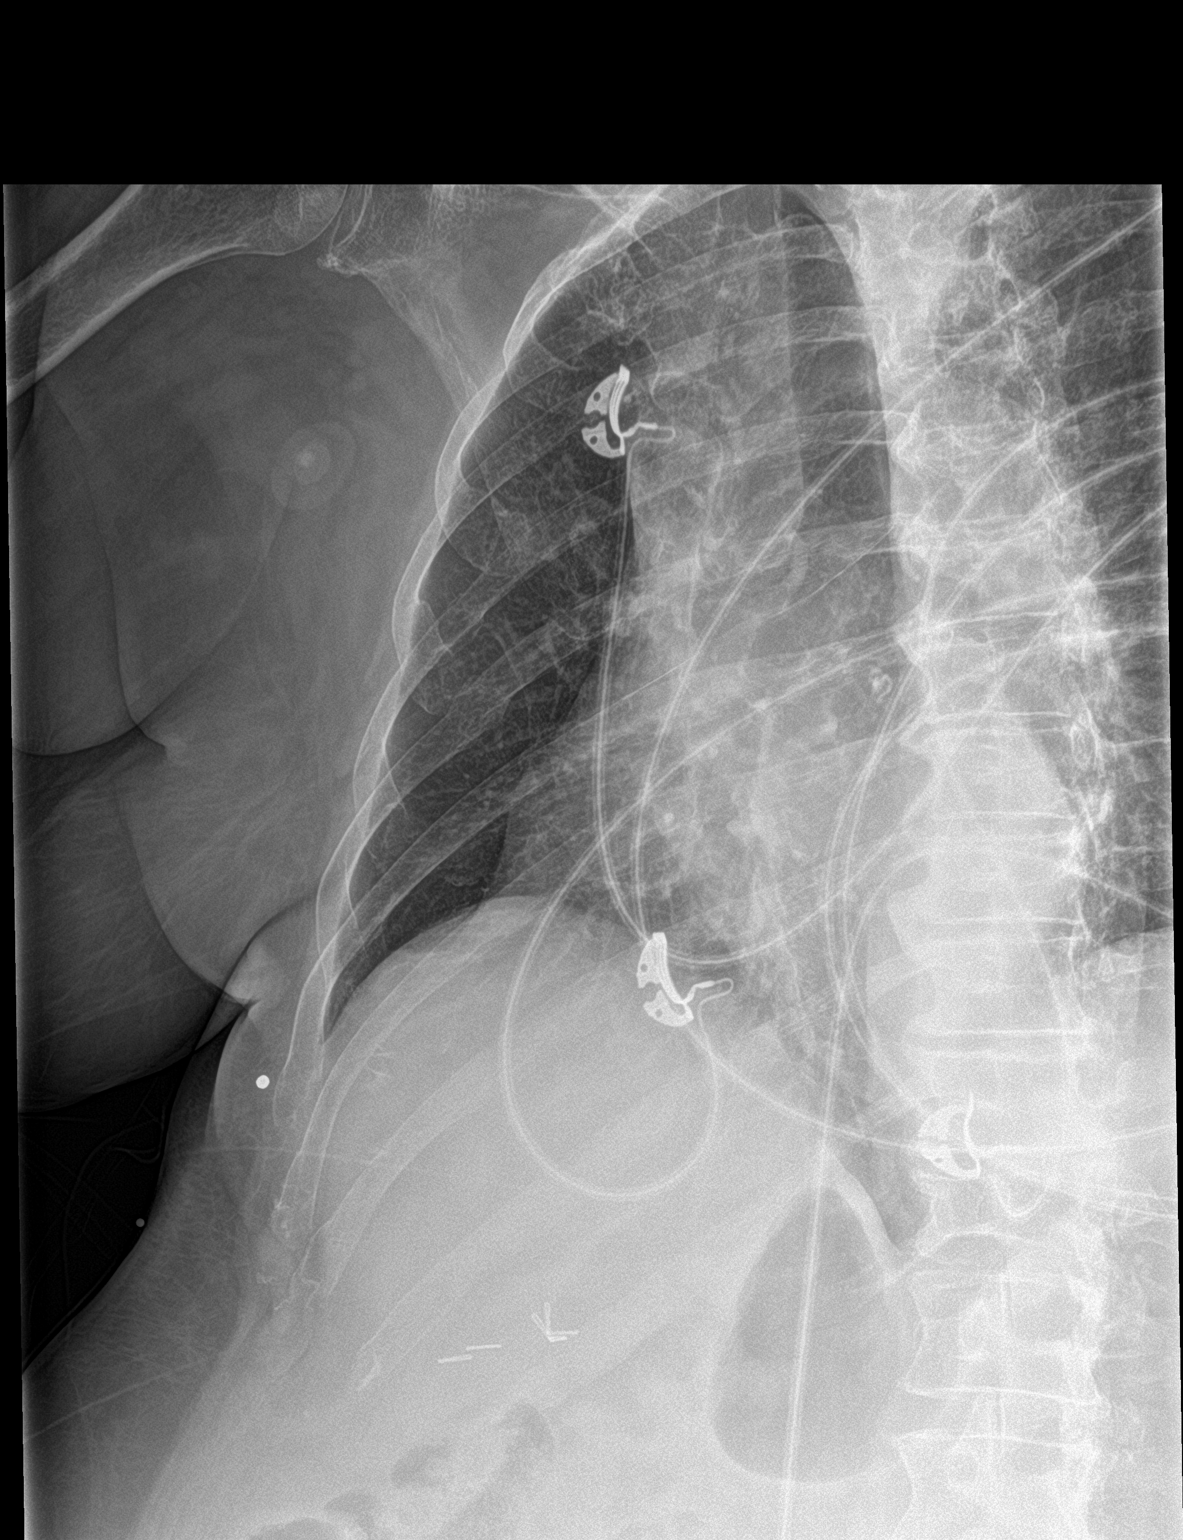

[rib pa (2 of 2)]
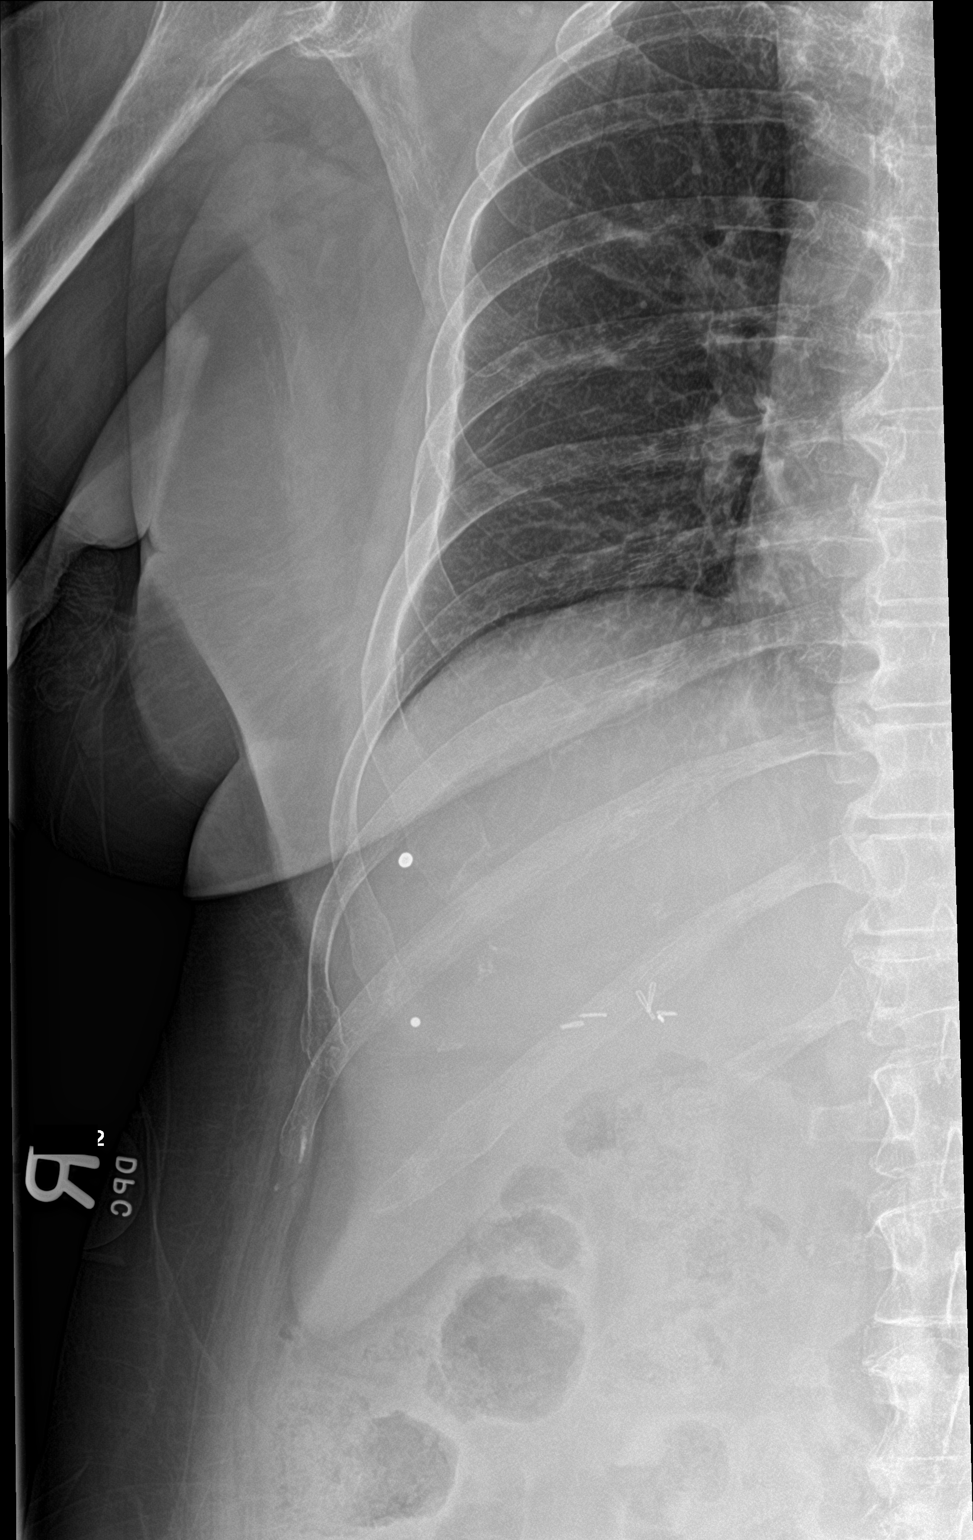

[3 of 3 positions shown; findings below may reference images not displayed]

FINDINGS: Metallic BB marker overlies the lower right ribs at site of pain.
Remotely healed fracture of the right posterior sixth and seventh
ribs noted. No acute displaced rib fracture identified. Visualized
right hemithorax clear. Metallic density again noted overlying the
right upper quadrant.
IMPRESSION: 1. No acute displaced rib fracture identified.
2. Remotely healed fractures of the right lateral sixth and seventh
ribs.

## 2019-02-09 ENCOUNTER — Other Ambulatory Visit: Payer: Self-pay | Admitting: Neurology

## 2019-03-06 ENCOUNTER — Other Ambulatory Visit: Payer: Self-pay | Admitting: Neurology

## 2019-04-09 ENCOUNTER — Other Ambulatory Visit: Payer: Self-pay | Admitting: Neurology

## 2019-04-09 NOTE — Telephone Encounter (Signed)
Patient was last seen in September 2018.  Last month a 30-day supply was sent for Lipitor. Please ask patient to request medication through her PCP, who has probably seen her more recently.  Refill will be denied.    If she wants me to continue to write the medication, she needs to set up in office follow-up and request recent lipid panel from PCP's office.

## 2019-04-09 NOTE — Telephone Encounter (Signed)
Called and spoke to [name] (her niece ok on DPR to speak to her). She is the one who brings patient to the MD. Explained Dr. Serita Grit reasoning (see below note) for denying the Lipitor as she has not been seen here in two years. (Informed her she can make appt with Dr. Armanda Magic lipid panel drawn at PCP and Dr. Posey Pronto can then order the medication.)   [name] stated patient sees Dr. [name] for her PCP and has been there fairly recently and she will let her aunt (patient) know to call Dr. Brigitte Pulse for refill for Lipitor.   She states she will tell her aunt all above and they will contact Dr. [name] for new script.

## 2019-06-15 DIAGNOSIS — E1142 Type 2 diabetes mellitus with diabetic polyneuropathy: Secondary | ICD-10-CM | POA: Diagnosis not present

## 2019-06-19 DIAGNOSIS — E119 Type 2 diabetes mellitus without complications: Secondary | ICD-10-CM | POA: Diagnosis not present

## 2019-06-19 DIAGNOSIS — M159 Polyosteoarthritis, unspecified: Secondary | ICD-10-CM | POA: Diagnosis not present

## 2019-06-22 ENCOUNTER — Other Ambulatory Visit: Payer: Self-pay | Admitting: Neurology

## 2019-07-16 DIAGNOSIS — E1142 Type 2 diabetes mellitus with diabetic polyneuropathy: Secondary | ICD-10-CM | POA: Diagnosis not present

## 2019-07-19 DIAGNOSIS — E119 Type 2 diabetes mellitus without complications: Secondary | ICD-10-CM | POA: Diagnosis not present

## 2019-07-19 DIAGNOSIS — M159 Polyosteoarthritis, unspecified: Secondary | ICD-10-CM | POA: Diagnosis not present

## 2019-07-23 ENCOUNTER — Other Ambulatory Visit: Payer: Self-pay | Admitting: Neurology

## 2019-07-30 DIAGNOSIS — Z299 Encounter for prophylactic measures, unspecified: Secondary | ICD-10-CM | POA: Diagnosis not present

## 2019-07-30 DIAGNOSIS — E11319 Type 2 diabetes mellitus with unspecified diabetic retinopathy without macular edema: Secondary | ICD-10-CM | POA: Diagnosis not present

## 2019-07-30 DIAGNOSIS — G20 Parkinson's disease: Secondary | ICD-10-CM | POA: Diagnosis not present

## 2019-07-30 DIAGNOSIS — Z789 Other specified health status: Secondary | ICD-10-CM | POA: Diagnosis not present

## 2019-07-30 DIAGNOSIS — E1165 Type 2 diabetes mellitus with hyperglycemia: Secondary | ICD-10-CM | POA: Diagnosis not present

## 2019-07-30 DIAGNOSIS — E1142 Type 2 diabetes mellitus with diabetic polyneuropathy: Secondary | ICD-10-CM | POA: Diagnosis not present

## 2019-08-09 ENCOUNTER — Other Ambulatory Visit: Payer: Self-pay | Admitting: Neurology

## 2019-08-13 DIAGNOSIS — E1142 Type 2 diabetes mellitus with diabetic polyneuropathy: Secondary | ICD-10-CM | POA: Diagnosis not present

## 2019-08-16 DIAGNOSIS — H43392 Other vitreous opacities, left eye: Secondary | ICD-10-CM | POA: Diagnosis not present

## 2019-08-16 DIAGNOSIS — E113593 Type 2 diabetes mellitus with proliferative diabetic retinopathy without macular edema, bilateral: Secondary | ICD-10-CM | POA: Diagnosis not present

## 2019-08-16 DIAGNOSIS — R52 Pain, unspecified: Secondary | ICD-10-CM | POA: Diagnosis not present

## 2019-08-16 DIAGNOSIS — H35372 Puckering of macula, left eye: Secondary | ICD-10-CM | POA: Diagnosis not present

## 2019-08-28 DIAGNOSIS — R52 Pain, unspecified: Secondary | ICD-10-CM | POA: Diagnosis not present

## 2019-09-02 DIAGNOSIS — E119 Type 2 diabetes mellitus without complications: Secondary | ICD-10-CM | POA: Diagnosis not present

## 2019-09-02 DIAGNOSIS — M159 Polyosteoarthritis, unspecified: Secondary | ICD-10-CM | POA: Diagnosis not present

## 2019-09-04 ENCOUNTER — Other Ambulatory Visit: Payer: Self-pay | Admitting: Neurology

## 2019-09-13 DIAGNOSIS — E1142 Type 2 diabetes mellitus with diabetic polyneuropathy: Secondary | ICD-10-CM | POA: Diagnosis not present

## 2019-09-24 ENCOUNTER — Other Ambulatory Visit: Payer: Self-pay | Admitting: Gastroenterology

## 2019-09-24 DIAGNOSIS — R112 Nausea with vomiting, unspecified: Secondary | ICD-10-CM

## 2019-10-04 DIAGNOSIS — E039 Hypothyroidism, unspecified: Secondary | ICD-10-CM | POA: Diagnosis not present

## 2019-10-04 DIAGNOSIS — Z79899 Other long term (current) drug therapy: Secondary | ICD-10-CM | POA: Diagnosis not present

## 2019-10-04 DIAGNOSIS — Z299 Encounter for prophylactic measures, unspecified: Secondary | ICD-10-CM | POA: Diagnosis not present

## 2019-10-04 DIAGNOSIS — R5383 Other fatigue: Secondary | ICD-10-CM | POA: Diagnosis not present

## 2019-10-04 DIAGNOSIS — Z7189 Other specified counseling: Secondary | ICD-10-CM | POA: Diagnosis not present

## 2019-10-04 DIAGNOSIS — Z1211 Encounter for screening for malignant neoplasm of colon: Secondary | ICD-10-CM | POA: Diagnosis not present

## 2019-10-04 DIAGNOSIS — E7800 Pure hypercholesterolemia, unspecified: Secondary | ICD-10-CM | POA: Diagnosis not present

## 2019-10-04 DIAGNOSIS — Z0000 Encounter for general adult medical examination without abnormal findings: Secondary | ICD-10-CM | POA: Diagnosis not present

## 2019-10-08 DIAGNOSIS — M159 Polyosteoarthritis, unspecified: Secondary | ICD-10-CM | POA: Diagnosis not present

## 2019-10-08 DIAGNOSIS — E119 Type 2 diabetes mellitus without complications: Secondary | ICD-10-CM | POA: Diagnosis not present

## 2019-10-13 DIAGNOSIS — E1142 Type 2 diabetes mellitus with diabetic polyneuropathy: Secondary | ICD-10-CM | POA: Diagnosis not present

## 2019-10-17 ENCOUNTER — Other Ambulatory Visit: Payer: Self-pay | Admitting: Gastroenterology

## 2019-10-17 DIAGNOSIS — E1143 Type 2 diabetes mellitus with diabetic autonomic (poly)neuropathy: Secondary | ICD-10-CM

## 2019-10-17 DIAGNOSIS — K3184 Gastroparesis: Secondary | ICD-10-CM

## 2019-10-29 DIAGNOSIS — R079 Chest pain, unspecified: Secondary | ICD-10-CM | POA: Diagnosis not present

## 2019-10-29 DIAGNOSIS — E1165 Type 2 diabetes mellitus with hyperglycemia: Secondary | ICD-10-CM | POA: Diagnosis not present

## 2019-10-29 DIAGNOSIS — Z789 Other specified health status: Secondary | ICD-10-CM | POA: Diagnosis not present

## 2019-10-29 DIAGNOSIS — Z299 Encounter for prophylactic measures, unspecified: Secondary | ICD-10-CM | POA: Diagnosis not present

## 2019-11-10 DIAGNOSIS — M159 Polyosteoarthritis, unspecified: Secondary | ICD-10-CM | POA: Diagnosis not present

## 2019-11-10 DIAGNOSIS — E119 Type 2 diabetes mellitus without complications: Secondary | ICD-10-CM | POA: Diagnosis not present

## 2019-11-22 DIAGNOSIS — E113512 Type 2 diabetes mellitus with proliferative diabetic retinopathy with macular edema, left eye: Secondary | ICD-10-CM | POA: Diagnosis not present

## 2019-11-22 DIAGNOSIS — E113591 Type 2 diabetes mellitus with proliferative diabetic retinopathy without macular edema, right eye: Secondary | ICD-10-CM | POA: Diagnosis not present

## 2019-11-22 DIAGNOSIS — H43812 Vitreous degeneration, left eye: Secondary | ICD-10-CM | POA: Diagnosis not present

## 2019-12-09 DIAGNOSIS — Z1231 Encounter for screening mammogram for malignant neoplasm of breast: Secondary | ICD-10-CM | POA: Diagnosis not present

## 2019-12-11 DIAGNOSIS — M159 Polyosteoarthritis, unspecified: Secondary | ICD-10-CM | POA: Diagnosis not present

## 2019-12-11 DIAGNOSIS — E119 Type 2 diabetes mellitus without complications: Secondary | ICD-10-CM | POA: Diagnosis not present

## 2019-12-25 ENCOUNTER — Other Ambulatory Visit: Payer: Self-pay | Admitting: Internal Medicine

## 2019-12-25 DIAGNOSIS — R921 Mammographic calcification found on diagnostic imaging of breast: Secondary | ICD-10-CM

## 2019-12-30 ENCOUNTER — Ambulatory Visit
Admission: RE | Admit: 2019-12-30 | Discharge: 2019-12-30 | Disposition: A | Discharge status: Home / Self Care | Payer: Medicare Other | Source: Ambulatory Visit | Attending: Internal Medicine | Admitting: Internal Medicine

## 2019-12-30 ENCOUNTER — Other Ambulatory Visit: Payer: Self-pay

## 2019-12-30 DIAGNOSIS — R921 Mammographic calcification found on diagnostic imaging of breast: Secondary | ICD-10-CM

## 2019-12-30 DIAGNOSIS — N6489 Other specified disorders of breast: Secondary | ICD-10-CM | POA: Diagnosis not present

## 2020-01-10 DIAGNOSIS — E119 Type 2 diabetes mellitus without complications: Secondary | ICD-10-CM | POA: Diagnosis not present

## 2020-01-10 DIAGNOSIS — M159 Polyosteoarthritis, unspecified: Secondary | ICD-10-CM | POA: Diagnosis not present

## 2020-01-30 DIAGNOSIS — Z299 Encounter for prophylactic measures, unspecified: Secondary | ICD-10-CM | POA: Diagnosis not present

## 2020-01-30 DIAGNOSIS — E1151 Type 2 diabetes mellitus with diabetic peripheral angiopathy without gangrene: Secondary | ICD-10-CM | POA: Diagnosis not present

## 2020-01-30 DIAGNOSIS — E11319 Type 2 diabetes mellitus with unspecified diabetic retinopathy without macular edema: Secondary | ICD-10-CM | POA: Diagnosis not present

## 2020-01-30 DIAGNOSIS — G20 Parkinson's disease: Secondary | ICD-10-CM | POA: Diagnosis not present

## 2020-01-30 DIAGNOSIS — E113513 Type 2 diabetes mellitus with proliferative diabetic retinopathy with macular edema, bilateral: Secondary | ICD-10-CM | POA: Diagnosis not present

## 2020-02-03 DIAGNOSIS — M159 Polyosteoarthritis, unspecified: Secondary | ICD-10-CM | POA: Diagnosis not present

## 2020-02-03 DIAGNOSIS — E119 Type 2 diabetes mellitus without complications: Secondary | ICD-10-CM | POA: Diagnosis not present

## 2020-02-19 DIAGNOSIS — E11319 Type 2 diabetes mellitus with unspecified diabetic retinopathy without macular edema: Secondary | ICD-10-CM | POA: Diagnosis not present

## 2020-02-19 DIAGNOSIS — E1165 Type 2 diabetes mellitus with hyperglycemia: Secondary | ICD-10-CM | POA: Diagnosis not present

## 2020-02-19 DIAGNOSIS — E1142 Type 2 diabetes mellitus with diabetic polyneuropathy: Secondary | ICD-10-CM | POA: Diagnosis not present

## 2020-02-19 DIAGNOSIS — G20 Parkinson's disease: Secondary | ICD-10-CM | POA: Diagnosis not present

## 2020-02-19 DIAGNOSIS — Z299 Encounter for prophylactic measures, unspecified: Secondary | ICD-10-CM | POA: Diagnosis not present

## 2020-04-06 DIAGNOSIS — H524 Presbyopia: Secondary | ICD-10-CM | POA: Diagnosis not present

## 2020-04-06 DIAGNOSIS — Z961 Presence of intraocular lens: Secondary | ICD-10-CM | POA: Diagnosis not present

## 2020-04-06 DIAGNOSIS — Z794 Long term (current) use of insulin: Secondary | ICD-10-CM | POA: Diagnosis not present

## 2020-04-06 DIAGNOSIS — E103513 Type 1 diabetes mellitus with proliferative diabetic retinopathy with macular edema, bilateral: Secondary | ICD-10-CM | POA: Diagnosis not present

## 2020-04-10 DIAGNOSIS — E119 Type 2 diabetes mellitus without complications: Secondary | ICD-10-CM | POA: Diagnosis not present

## 2020-04-10 DIAGNOSIS — M159 Polyosteoarthritis, unspecified: Secondary | ICD-10-CM | POA: Diagnosis not present

## 2020-04-10 DIAGNOSIS — H5213 Myopia, bilateral: Secondary | ICD-10-CM | POA: Diagnosis not present

## 2020-05-12 DIAGNOSIS — M159 Polyosteoarthritis, unspecified: Secondary | ICD-10-CM | POA: Diagnosis not present

## 2020-05-12 DIAGNOSIS — E119 Type 2 diabetes mellitus without complications: Secondary | ICD-10-CM | POA: Diagnosis not present

## 2020-05-21 DIAGNOSIS — Z23 Encounter for immunization: Secondary | ICD-10-CM | POA: Diagnosis not present

## 2020-05-21 DIAGNOSIS — H524 Presbyopia: Secondary | ICD-10-CM | POA: Diagnosis not present

## 2020-05-21 DIAGNOSIS — E1142 Type 2 diabetes mellitus with diabetic polyneuropathy: Secondary | ICD-10-CM | POA: Diagnosis not present

## 2020-05-21 DIAGNOSIS — Z299 Encounter for prophylactic measures, unspecified: Secondary | ICD-10-CM | POA: Diagnosis not present

## 2020-05-21 DIAGNOSIS — H52222 Regular astigmatism, left eye: Secondary | ICD-10-CM | POA: Diagnosis not present

## 2020-05-21 DIAGNOSIS — E113513 Type 2 diabetes mellitus with proliferative diabetic retinopathy with macular edema, bilateral: Secondary | ICD-10-CM | POA: Diagnosis not present

## 2020-05-21 DIAGNOSIS — E1165 Type 2 diabetes mellitus with hyperglycemia: Secondary | ICD-10-CM | POA: Diagnosis not present

## 2020-05-27 ENCOUNTER — Other Ambulatory Visit: Payer: Self-pay | Admitting: Gastroenterology

## 2020-05-27 DIAGNOSIS — R112 Nausea with vomiting, unspecified: Secondary | ICD-10-CM

## 2020-06-11 DIAGNOSIS — M159 Polyosteoarthritis, unspecified: Secondary | ICD-10-CM | POA: Diagnosis not present

## 2020-06-11 DIAGNOSIS — E119 Type 2 diabetes mellitus without complications: Secondary | ICD-10-CM | POA: Diagnosis not present

## 2020-07-08 DIAGNOSIS — Z20822 Contact with and (suspected) exposure to covid-19: Secondary | ICD-10-CM | POA: Diagnosis not present

## 2020-07-08 DIAGNOSIS — E1165 Type 2 diabetes mellitus with hyperglycemia: Secondary | ICD-10-CM | POA: Diagnosis not present

## 2020-07-08 DIAGNOSIS — J312 Chronic pharyngitis: Secondary | ICD-10-CM | POA: Diagnosis not present

## 2020-07-08 DIAGNOSIS — E2749 Other adrenocortical insufficiency: Secondary | ICD-10-CM | POA: Diagnosis not present

## 2020-07-08 DIAGNOSIS — Z299 Encounter for prophylactic measures, unspecified: Secondary | ICD-10-CM | POA: Diagnosis not present

## 2020-08-10 DIAGNOSIS — E039 Hypothyroidism, unspecified: Secondary | ICD-10-CM | POA: Diagnosis not present

## 2020-08-10 DIAGNOSIS — E1165 Type 2 diabetes mellitus with hyperglycemia: Secondary | ICD-10-CM | POA: Diagnosis not present

## 2020-08-10 DIAGNOSIS — K219 Gastro-esophageal reflux disease without esophagitis: Secondary | ICD-10-CM | POA: Diagnosis not present

## 2020-08-10 DIAGNOSIS — I120 Hypertensive chronic kidney disease with stage 5 chronic kidney disease or end stage renal disease: Secondary | ICD-10-CM | POA: Diagnosis not present

## 2020-08-21 DIAGNOSIS — Z299 Encounter for prophylactic measures, unspecified: Secondary | ICD-10-CM | POA: Diagnosis not present

## 2020-08-21 DIAGNOSIS — E2749 Other adrenocortical insufficiency: Secondary | ICD-10-CM | POA: Diagnosis not present

## 2020-08-21 DIAGNOSIS — E1142 Type 2 diabetes mellitus with diabetic polyneuropathy: Secondary | ICD-10-CM | POA: Diagnosis not present

## 2020-08-21 DIAGNOSIS — E11319 Type 2 diabetes mellitus with unspecified diabetic retinopathy without macular edema: Secondary | ICD-10-CM | POA: Diagnosis not present

## 2020-08-21 DIAGNOSIS — E1165 Type 2 diabetes mellitus with hyperglycemia: Secondary | ICD-10-CM | POA: Diagnosis not present

## 2020-09-01 DIAGNOSIS — Z7409 Other reduced mobility: Secondary | ICD-10-CM | POA: Diagnosis not present

## 2020-09-01 DIAGNOSIS — R109 Unspecified abdominal pain: Secondary | ICD-10-CM | POA: Diagnosis not present

## 2020-09-01 DIAGNOSIS — I6359 Cerebral infarction due to unspecified occlusion or stenosis of other cerebral artery: Secondary | ICD-10-CM | POA: Diagnosis not present

## 2020-09-01 DIAGNOSIS — R262 Difficulty in walking, not elsewhere classified: Secondary | ICD-10-CM | POA: Diagnosis not present

## 2020-09-01 DIAGNOSIS — N390 Urinary tract infection, site not specified: Secondary | ICD-10-CM | POA: Diagnosis not present

## 2020-09-01 DIAGNOSIS — R519 Headache, unspecified: Secondary | ICD-10-CM | POA: Diagnosis not present

## 2020-09-01 DIAGNOSIS — Z9181 History of falling: Secondary | ICD-10-CM | POA: Diagnosis not present

## 2020-09-01 DIAGNOSIS — G20 Parkinson's disease: Secondary | ICD-10-CM | POA: Diagnosis not present

## 2020-09-01 DIAGNOSIS — N3000 Acute cystitis without hematuria: Secondary | ICD-10-CM | POA: Diagnosis not present

## 2020-09-01 DIAGNOSIS — I4510 Unspecified right bundle-branch block: Secondary | ICD-10-CM | POA: Diagnosis not present

## 2020-09-01 DIAGNOSIS — Z882 Allergy status to sulfonamides status: Secondary | ICD-10-CM | POA: Diagnosis not present

## 2020-09-01 DIAGNOSIS — R531 Weakness: Secondary | ICD-10-CM | POA: Diagnosis not present

## 2020-09-01 DIAGNOSIS — Z87891 Personal history of nicotine dependence: Secondary | ICD-10-CM | POA: Diagnosis not present

## 2020-09-01 DIAGNOSIS — Z7982 Long term (current) use of aspirin: Secondary | ICD-10-CM | POA: Diagnosis not present

## 2020-09-01 DIAGNOSIS — Z20822 Contact with and (suspected) exposure to covid-19: Secondary | ICD-10-CM | POA: Diagnosis not present

## 2020-09-01 DIAGNOSIS — R4781 Slurred speech: Secondary | ICD-10-CM | POA: Diagnosis not present

## 2020-09-01 DIAGNOSIS — R2689 Other abnormalities of gait and mobility: Secondary | ICD-10-CM | POA: Diagnosis not present

## 2020-09-01 DIAGNOSIS — Z79899 Other long term (current) drug therapy: Secondary | ICD-10-CM | POA: Diagnosis not present

## 2020-09-01 DIAGNOSIS — R9431 Abnormal electrocardiogram [ECG] [EKG]: Secondary | ICD-10-CM | POA: Diagnosis not present

## 2020-09-01 DIAGNOSIS — M542 Cervicalgia: Secondary | ICD-10-CM | POA: Diagnosis not present

## 2020-09-01 DIAGNOSIS — R110 Nausea: Secondary | ICD-10-CM | POA: Diagnosis not present

## 2020-09-01 DIAGNOSIS — Z885 Allergy status to narcotic agent status: Secondary | ICD-10-CM | POA: Diagnosis not present

## 2020-09-01 DIAGNOSIS — Z743 Need for continuous supervision: Secondary | ICD-10-CM | POA: Diagnosis not present

## 2020-09-01 DIAGNOSIS — E119 Type 2 diabetes mellitus without complications: Secondary | ICD-10-CM | POA: Diagnosis not present

## 2020-09-02 DIAGNOSIS — R531 Weakness: Secondary | ICD-10-CM | POA: Diagnosis not present

## 2020-09-02 DIAGNOSIS — I639 Cerebral infarction, unspecified: Secondary | ICD-10-CM | POA: Diagnosis not present

## 2020-09-02 DIAGNOSIS — I6350 Cerebral infarction due to unspecified occlusion or stenosis of unspecified cerebral artery: Secondary | ICD-10-CM | POA: Diagnosis not present

## 2020-09-02 DIAGNOSIS — N3000 Acute cystitis without hematuria: Secondary | ICD-10-CM | POA: Diagnosis not present

## 2020-09-02 DIAGNOSIS — I63233 Cerebral infarction due to unspecified occlusion or stenosis of bilateral carotid arteries: Secondary | ICD-10-CM | POA: Diagnosis not present

## 2020-09-02 DIAGNOSIS — N390 Urinary tract infection, site not specified: Secondary | ICD-10-CM | POA: Diagnosis not present

## 2020-09-03 DIAGNOSIS — N390 Urinary tract infection, site not specified: Secondary | ICD-10-CM | POA: Diagnosis not present

## 2020-09-03 DIAGNOSIS — I6350 Cerebral infarction due to unspecified occlusion or stenosis of unspecified cerebral artery: Secondary | ICD-10-CM | POA: Diagnosis not present

## 2020-09-03 DIAGNOSIS — R531 Weakness: Secondary | ICD-10-CM | POA: Diagnosis not present

## 2020-09-03 DIAGNOSIS — N3000 Acute cystitis without hematuria: Secondary | ICD-10-CM | POA: Diagnosis not present

## 2020-09-04 DIAGNOSIS — G9341 Metabolic encephalopathy: Secondary | ICD-10-CM | POA: Diagnosis not present

## 2020-09-04 DIAGNOSIS — Z9181 History of falling: Secondary | ICD-10-CM | POA: Diagnosis not present

## 2020-09-04 DIAGNOSIS — E1143 Type 2 diabetes mellitus with diabetic autonomic (poly)neuropathy: Secondary | ICD-10-CM | POA: Diagnosis not present

## 2020-09-04 DIAGNOSIS — Z743 Need for continuous supervision: Secondary | ICD-10-CM | POA: Diagnosis not present

## 2020-09-04 DIAGNOSIS — E785 Hyperlipidemia, unspecified: Secondary | ICD-10-CM | POA: Diagnosis not present

## 2020-09-04 DIAGNOSIS — E039 Hypothyroidism, unspecified: Secondary | ICD-10-CM | POA: Diagnosis not present

## 2020-09-04 DIAGNOSIS — Z66 Do not resuscitate: Secondary | ICD-10-CM | POA: Diagnosis not present

## 2020-09-04 DIAGNOSIS — Z882 Allergy status to sulfonamides status: Secondary | ICD-10-CM | POA: Diagnosis not present

## 2020-09-04 DIAGNOSIS — R4781 Slurred speech: Secondary | ICD-10-CM | POA: Diagnosis not present

## 2020-09-04 DIAGNOSIS — Z7409 Other reduced mobility: Secondary | ICD-10-CM | POA: Diagnosis not present

## 2020-09-04 DIAGNOSIS — Z87440 Personal history of urinary (tract) infections: Secondary | ICD-10-CM | POA: Diagnosis not present

## 2020-09-04 DIAGNOSIS — R2689 Other abnormalities of gait and mobility: Secondary | ICD-10-CM | POA: Diagnosis not present

## 2020-09-04 DIAGNOSIS — Z885 Allergy status to narcotic agent status: Secondary | ICD-10-CM | POA: Diagnosis not present

## 2020-09-04 DIAGNOSIS — R404 Transient alteration of awareness: Secondary | ICD-10-CM | POA: Diagnosis not present

## 2020-09-04 DIAGNOSIS — R1312 Dysphagia, oropharyngeal phase: Secondary | ICD-10-CM | POA: Diagnosis not present

## 2020-09-04 DIAGNOSIS — N3000 Acute cystitis without hematuria: Secondary | ICD-10-CM | POA: Diagnosis not present

## 2020-09-04 DIAGNOSIS — I4510 Unspecified right bundle-branch block: Secondary | ICD-10-CM | POA: Diagnosis not present

## 2020-09-04 DIAGNOSIS — M2578 Osteophyte, vertebrae: Secondary | ICD-10-CM | POA: Diagnosis not present

## 2020-09-04 DIAGNOSIS — I120 Hypertensive chronic kidney disease with stage 5 chronic kidney disease or end stage renal disease: Secondary | ICD-10-CM | POA: Diagnosis not present

## 2020-09-04 DIAGNOSIS — E113513 Type 2 diabetes mellitus with proliferative diabetic retinopathy with macular edema, bilateral: Secondary | ICD-10-CM | POA: Diagnosis not present

## 2020-09-04 DIAGNOSIS — E119 Type 2 diabetes mellitus without complications: Secondary | ICD-10-CM | POA: Diagnosis not present

## 2020-09-04 DIAGNOSIS — K3184 Gastroparesis: Secondary | ICD-10-CM | POA: Diagnosis not present

## 2020-09-04 DIAGNOSIS — I639 Cerebral infarction, unspecified: Secondary | ICD-10-CM | POA: Diagnosis not present

## 2020-09-04 DIAGNOSIS — N289 Disorder of kidney and ureter, unspecified: Secondary | ICD-10-CM | POA: Diagnosis not present

## 2020-09-04 DIAGNOSIS — I10 Essential (primary) hypertension: Secondary | ICD-10-CM | POA: Diagnosis not present

## 2020-09-04 DIAGNOSIS — R1310 Dysphagia, unspecified: Secondary | ICD-10-CM | POA: Diagnosis not present

## 2020-09-04 DIAGNOSIS — N390 Urinary tract infection, site not specified: Secondary | ICD-10-CM | POA: Diagnosis not present

## 2020-09-04 DIAGNOSIS — I69351 Hemiplegia and hemiparesis following cerebral infarction affecting right dominant side: Secondary | ICD-10-CM | POA: Diagnosis not present

## 2020-09-04 DIAGNOSIS — R112 Nausea with vomiting, unspecified: Secondary | ICD-10-CM | POA: Diagnosis not present

## 2020-09-04 DIAGNOSIS — K219 Gastro-esophageal reflux disease without esophagitis: Secondary | ICD-10-CM | POA: Diagnosis not present

## 2020-09-04 DIAGNOSIS — Z87891 Personal history of nicotine dependence: Secondary | ICD-10-CM | POA: Diagnosis not present

## 2020-09-04 DIAGNOSIS — K5641 Fecal impaction: Secondary | ICD-10-CM | POA: Diagnosis not present

## 2020-09-04 DIAGNOSIS — E11319 Type 2 diabetes mellitus with unspecified diabetic retinopathy without macular edema: Secondary | ICD-10-CM | POA: Diagnosis not present

## 2020-09-04 DIAGNOSIS — E860 Dehydration: Secondary | ICD-10-CM | POA: Diagnosis not present

## 2020-09-04 DIAGNOSIS — Z20822 Contact with and (suspected) exposure to covid-19: Secondary | ICD-10-CM | POA: Diagnosis not present

## 2020-09-04 DIAGNOSIS — Z7982 Long term (current) use of aspirin: Secondary | ICD-10-CM | POA: Diagnosis not present

## 2020-09-04 DIAGNOSIS — I6359 Cerebral infarction due to unspecified occlusion or stenosis of other cerebral artery: Secondary | ICD-10-CM | POA: Diagnosis not present

## 2020-09-04 DIAGNOSIS — R262 Difficulty in walking, not elsewhere classified: Secondary | ICD-10-CM | POA: Diagnosis not present

## 2020-09-04 DIAGNOSIS — E875 Hyperkalemia: Secondary | ICD-10-CM | POA: Diagnosis not present

## 2020-09-04 DIAGNOSIS — Z7984 Long term (current) use of oral hypoglycemic drugs: Secondary | ICD-10-CM | POA: Diagnosis not present

## 2020-09-04 DIAGNOSIS — Z299 Encounter for prophylactic measures, unspecified: Secondary | ICD-10-CM | POA: Diagnosis not present

## 2020-09-04 DIAGNOSIS — M6281 Muscle weakness (generalized): Secondary | ICD-10-CM | POA: Diagnosis not present

## 2020-09-04 DIAGNOSIS — R410 Disorientation, unspecified: Secondary | ICD-10-CM | POA: Diagnosis not present

## 2020-09-04 DIAGNOSIS — D1779 Benign lipomatous neoplasm of other sites: Secondary | ICD-10-CM | POA: Diagnosis not present

## 2020-09-04 DIAGNOSIS — R110 Nausea: Secondary | ICD-10-CM | POA: Diagnosis not present

## 2020-09-04 DIAGNOSIS — R9431 Abnormal electrocardiogram [ECG] [EKG]: Secondary | ICD-10-CM | POA: Diagnosis not present

## 2020-09-04 DIAGNOSIS — K5900 Constipation, unspecified: Secondary | ICD-10-CM | POA: Diagnosis not present

## 2020-09-04 DIAGNOSIS — R42 Dizziness and giddiness: Secondary | ICD-10-CM | POA: Diagnosis not present

## 2020-09-04 DIAGNOSIS — E2749 Other adrenocortical insufficiency: Secondary | ICD-10-CM | POA: Diagnosis not present

## 2020-09-04 DIAGNOSIS — E1165 Type 2 diabetes mellitus with hyperglycemia: Secondary | ICD-10-CM | POA: Diagnosis not present

## 2020-09-04 DIAGNOSIS — Z8673 Personal history of transient ischemic attack (TIA), and cerebral infarction without residual deficits: Secondary | ICD-10-CM | POA: Diagnosis not present

## 2020-09-04 DIAGNOSIS — R1111 Vomiting without nausea: Secondary | ICD-10-CM | POA: Diagnosis not present

## 2020-09-04 DIAGNOSIS — I6350 Cerebral infarction due to unspecified occlusion or stenosis of unspecified cerebral artery: Secondary | ICD-10-CM | POA: Diagnosis not present

## 2020-09-04 DIAGNOSIS — M542 Cervicalgia: Secondary | ICD-10-CM | POA: Diagnosis not present

## 2020-09-04 DIAGNOSIS — R531 Weakness: Secondary | ICD-10-CM | POA: Diagnosis not present

## 2020-09-04 DIAGNOSIS — E1151 Type 2 diabetes mellitus with diabetic peripheral angiopathy without gangrene: Secondary | ICD-10-CM | POA: Diagnosis not present

## 2020-09-04 DIAGNOSIS — R1110 Vomiting, unspecified: Secondary | ICD-10-CM | POA: Diagnosis not present

## 2020-09-04 DIAGNOSIS — N179 Acute kidney failure, unspecified: Secondary | ICD-10-CM | POA: Diagnosis not present

## 2020-09-04 DIAGNOSIS — Z7902 Long term (current) use of antithrombotics/antiplatelets: Secondary | ICD-10-CM | POA: Diagnosis not present

## 2020-09-04 DIAGNOSIS — R6889 Other general symptoms and signs: Secondary | ICD-10-CM | POA: Diagnosis not present

## 2020-09-04 DIAGNOSIS — Z79899 Other long term (current) drug therapy: Secondary | ICD-10-CM | POA: Diagnosis not present

## 2020-09-04 DIAGNOSIS — G20 Parkinson's disease: Secondary | ICD-10-CM | POA: Diagnosis not present

## 2020-09-07 DIAGNOSIS — R4781 Slurred speech: Secondary | ICD-10-CM | POA: Diagnosis not present

## 2020-09-07 DIAGNOSIS — Z8673 Personal history of transient ischemic attack (TIA), and cerebral infarction without residual deficits: Secondary | ICD-10-CM | POA: Diagnosis not present

## 2020-09-08 DIAGNOSIS — R4781 Slurred speech: Secondary | ICD-10-CM | POA: Diagnosis not present

## 2020-09-08 DIAGNOSIS — R6889 Other general symptoms and signs: Secondary | ICD-10-CM | POA: Diagnosis not present

## 2020-09-08 DIAGNOSIS — R112 Nausea with vomiting, unspecified: Secondary | ICD-10-CM | POA: Diagnosis not present

## 2020-09-08 DIAGNOSIS — Z7982 Long term (current) use of aspirin: Secondary | ICD-10-CM | POA: Diagnosis not present

## 2020-09-08 DIAGNOSIS — R1110 Vomiting, unspecified: Secondary | ICD-10-CM | POA: Diagnosis not present

## 2020-09-08 DIAGNOSIS — Z882 Allergy status to sulfonamides status: Secondary | ICD-10-CM | POA: Diagnosis not present

## 2020-09-08 DIAGNOSIS — Z743 Need for continuous supervision: Secondary | ICD-10-CM | POA: Diagnosis not present

## 2020-09-08 DIAGNOSIS — R531 Weakness: Secondary | ICD-10-CM | POA: Diagnosis not present

## 2020-09-08 DIAGNOSIS — M542 Cervicalgia: Secondary | ICD-10-CM | POA: Diagnosis not present

## 2020-09-08 DIAGNOSIS — Z885 Allergy status to narcotic agent status: Secondary | ICD-10-CM | POA: Diagnosis not present

## 2020-09-08 DIAGNOSIS — I639 Cerebral infarction, unspecified: Secondary | ICD-10-CM | POA: Diagnosis not present

## 2020-09-08 DIAGNOSIS — Z87891 Personal history of nicotine dependence: Secondary | ICD-10-CM | POA: Diagnosis not present

## 2020-09-08 DIAGNOSIS — K5900 Constipation, unspecified: Secondary | ICD-10-CM | POA: Diagnosis not present

## 2020-09-15 DIAGNOSIS — E2749 Other adrenocortical insufficiency: Secondary | ICD-10-CM | POA: Diagnosis not present

## 2020-09-15 DIAGNOSIS — E113513 Type 2 diabetes mellitus with proliferative diabetic retinopathy with macular edema, bilateral: Secondary | ICD-10-CM | POA: Diagnosis not present

## 2020-09-15 DIAGNOSIS — K5900 Constipation, unspecified: Secondary | ICD-10-CM | POA: Diagnosis not present

## 2020-09-15 DIAGNOSIS — E1151 Type 2 diabetes mellitus with diabetic peripheral angiopathy without gangrene: Secondary | ICD-10-CM | POA: Diagnosis not present

## 2020-09-15 DIAGNOSIS — Z299 Encounter for prophylactic measures, unspecified: Secondary | ICD-10-CM | POA: Diagnosis not present

## 2020-09-17 DIAGNOSIS — K5900 Constipation, unspecified: Secondary | ICD-10-CM | POA: Diagnosis not present

## 2020-10-10 DIAGNOSIS — E039 Hypothyroidism, unspecified: Secondary | ICD-10-CM | POA: Diagnosis not present

## 2020-10-10 DIAGNOSIS — I120 Hypertensive chronic kidney disease with stage 5 chronic kidney disease or end stage renal disease: Secondary | ICD-10-CM | POA: Diagnosis not present

## 2020-10-10 DIAGNOSIS — E1165 Type 2 diabetes mellitus with hyperglycemia: Secondary | ICD-10-CM | POA: Diagnosis not present

## 2020-10-10 DIAGNOSIS — K219 Gastro-esophageal reflux disease without esophagitis: Secondary | ICD-10-CM | POA: Diagnosis not present

## 2020-10-13 DIAGNOSIS — Z299 Encounter for prophylactic measures, unspecified: Secondary | ICD-10-CM | POA: Diagnosis not present

## 2020-10-13 DIAGNOSIS — E11319 Type 2 diabetes mellitus with unspecified diabetic retinopathy without macular edema: Secondary | ICD-10-CM | POA: Diagnosis not present

## 2020-10-13 DIAGNOSIS — E1165 Type 2 diabetes mellitus with hyperglycemia: Secondary | ICD-10-CM | POA: Diagnosis not present

## 2020-10-13 DIAGNOSIS — R110 Nausea: Secondary | ICD-10-CM | POA: Diagnosis not present

## 2020-10-13 DIAGNOSIS — E2749 Other adrenocortical insufficiency: Secondary | ICD-10-CM | POA: Diagnosis not present

## 2020-10-14 DIAGNOSIS — R110 Nausea: Secondary | ICD-10-CM | POA: Diagnosis not present

## 2020-10-18 ENCOUNTER — Other Ambulatory Visit: Payer: Self-pay | Admitting: Gastroenterology

## 2020-10-18 DIAGNOSIS — K3184 Gastroparesis: Secondary | ICD-10-CM

## 2020-10-18 DIAGNOSIS — E1143 Type 2 diabetes mellitus with diabetic autonomic (poly)neuropathy: Secondary | ICD-10-CM

## 2020-10-19 ENCOUNTER — Telehealth: Payer: Self-pay

## 2020-10-19 NOTE — Telephone Encounter (Signed)
Jenna Maldonado- speech pathologist from [facility] rehab center called about this pt. Pt is complaining of increased lower epigastric pain and dysphagia. The patients niece informed her that we had seen her a couple of years ago and wondered if we needed to see the patient again. Last ov was a virtual in 2020 with Jenna Maldonado.  I advised her that the schedule was out a ways but Dr.Rourk had a cancellation on 11/03/20 at 2:30. She said she wanted pt to have that appointment and let me speak with the scheduling staff for the facility to make sure it was ok. They said it was fine and are aware of appointment date and time.  Routing to [employer] for [employer].

## 2020-10-27 DIAGNOSIS — E1165 Type 2 diabetes mellitus with hyperglycemia: Secondary | ICD-10-CM | POA: Diagnosis not present

## 2020-10-27 DIAGNOSIS — E11319 Type 2 diabetes mellitus with unspecified diabetic retinopathy without macular edema: Secondary | ICD-10-CM | POA: Diagnosis not present

## 2020-10-27 DIAGNOSIS — N390 Urinary tract infection, site not specified: Secondary | ICD-10-CM | POA: Diagnosis not present

## 2020-10-27 DIAGNOSIS — Z299 Encounter for prophylactic measures, unspecified: Secondary | ICD-10-CM | POA: Diagnosis not present

## 2020-10-27 DIAGNOSIS — E2749 Other adrenocortical insufficiency: Secondary | ICD-10-CM | POA: Diagnosis not present

## 2020-10-28 DIAGNOSIS — R42 Dizziness and giddiness: Secondary | ICD-10-CM | POA: Diagnosis not present

## 2020-10-28 DIAGNOSIS — R112 Nausea with vomiting, unspecified: Secondary | ICD-10-CM | POA: Diagnosis not present

## 2020-11-03 ENCOUNTER — Other Ambulatory Visit: Payer: Self-pay | Admitting: *Deleted

## 2020-11-03 ENCOUNTER — Other Ambulatory Visit: Payer: Self-pay

## 2020-11-03 ENCOUNTER — Ambulatory Visit (INDEPENDENT_AMBULATORY_CARE_PROVIDER_SITE_OTHER): Payer: Medicare Other | Admitting: Internal Medicine

## 2020-11-03 ENCOUNTER — Encounter: Payer: Self-pay | Admitting: Internal Medicine

## 2020-11-03 VITALS — BP 138/86 | HR 91 | Temp 96.9°F | Ht 63.0 in | Wt 158.0 lb

## 2020-11-03 DIAGNOSIS — E1143 Type 2 diabetes mellitus with diabetic autonomic (poly)neuropathy: Secondary | ICD-10-CM | POA: Diagnosis not present

## 2020-11-03 DIAGNOSIS — R1312 Dysphagia, oropharyngeal phase: Secondary | ICD-10-CM | POA: Diagnosis not present

## 2020-11-03 DIAGNOSIS — K3184 Gastroparesis: Secondary | ICD-10-CM

## 2020-11-03 DIAGNOSIS — R1310 Dysphagia, unspecified: Secondary | ICD-10-CM

## 2020-11-03 MED ORDER — TRULANCE 3 MG PO TABS: 1.0000 | ORAL_TABLET | Freq: Every day | ORAL | 11 refills | Status: AC

## 2020-11-03 NOTE — Patient Instructions (Addendum)
Trial of Trulance 3 mg daily - samples provided; Rx for Trulance 3 mg daily (disp 30 wit 11 refills - try samples first (okay to crush up tablets and a small amount of applesauce-give each morning)  Monitor for bowel function  Continue omeprazole 40 mg daily for now  Barium pill esophagram and upper GI series.  Vague esophageal dysphagia early satiety nausea and vomiting -rule out esophageal/gastric outlet obstruction.  Further recommendations to follow.  Further evaluation may be needed.

## 2020-11-03 NOTE — Progress Notes (Signed)
Primary Care Physician:  Monico Blitz, MD Primary Gastroenterologist:  Dr.   Pre-Procedure History & Physical: HPI:  Jenna Maldonado is a 70 y.o. female here for further evaluation of vague epigastric pain, esophageal and oropharyngeal dysphagia.  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  Right hemiplegia wheelchair-bound, significant oropharyngeal dysfunction on speech evaluation.  Penetration and aspiration noted. Reported gastric emptying study previously abnormal.  Prior EGD by me a couple of years ago demonstrated normal esophagus -  empiric Maloney dilation helped dysphagia only for short period of time.  Previously on Reglan but stopped because of tardive dyskinesia.  Previously doing well with Dexilant but switched to omeprazole 40 mg daily for unknown reason..  We have seen for constipation previously.  Linzess, Amitiza of no benefit although duration of therapy in question.  Given MiraLAX on a as needed basis at the nursing home.  3/20 plain films demonstrated a large stool burden in the colon.  Last colonoscopy reported to be normal by Dr. Benson Norway in  2006.  Patient's niece/caregiver accompanies her today appears to be on top of things.  Denies any rectal bleeding.  Appetite has fallen off. Recent Hemoglobin A1c unknown to me. Past Medical History:  Diagnosis Date  . 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 Calculus of kidney    L inferior kidney ; non obstructing  . Chronic constipation   . Chronic headaches   . Complication of anesthesia   . Depression   . Diabetic autonomic neuropathy ([place])   . Diabetic neuropathy ([place])   . Esophageal reflux   . Hypothyroidism, adult   . Orthostatic hypotension   . PONV (postoperative nausea and vomiting)   . Residual foreign body in soft tissue   . 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 Shortness of breath     Past Surgical History:  Procedure Laterality Date  . BIOPSY  09/15/2017    Procedure: BIOPSY;  Surgeon: Daneil Dolin, MD;  [facility];  Service: Endoscopy;;  gastric biopsy , gastric polyp biopsy  . carpel tunnel    . CHOLECYSTECTOMY  11/2014   June 2016  . ESOPHAGOGASTRODUODENOSCOPY  03/2015   Dr. Britta Mccreedy: hiatal hernia, gastritis  . ESOPHAGOGASTRODUODENOSCOPY (EGD) WITH PROPOFOL N/A 09/15/2017    normal esophagus s/p dilation, abnormal mucosa congested appearing, with some snake skinning appearance, patent pylorus, normal duodenum. Reactive gastropathy, hyperplastic polyps.   . Eye Surgrey Bilateral    4-5 times for macular degeneration  . HAND SURGERY Bilateral 2001  . MALONEY DILATION N/A 09/15/2017   Procedure: Venia Minks DILATION;  Surgeon: Daneil Dolin, MD;  [facility];  Service: Endoscopy;  Laterality: N/A;  . SHOULDER SURGERY Left   . TUBAL LIGATION      Prior to Admission medications   Medication Sig Start Date End Date Taking? Authorizing Provider  amLODipine (NORVASC) 10 MG tablet Take 10 mg by mouth daily.   Yes [provider]  aspirin EC 81 MG tablet Take 81 mg by mouth daily.   Yes [provider]  carbamazepine (TEGRETOL XR) 100 MG 12 hr tablet Take 300 mg by mouth 2 (two) times daily.    Yes [provider]  clopidogrel (PLAVIX) 75 MG tablet Take 75 mg by mouth daily.   Yes [provider]  FLUoxetine (PROZAC) 40 MG capsule Take 40 mg by mouth daily.  12/30/12  Yes [provider]  insulin degludec (TRESIBA) 100  UNIT/ML FlexTouch Pen Inject 20 Units into the skin daily.   Yes [provider]  levothyroxine (SYNTHROID) 75 MCG tablet Take 75 mcg by mouth daily before breakfast.   Yes [provider]  meclizine (ANTIVERT) 12.5 MG tablet Take 12.5 mg by mouth 3 (three) times daily. X10 days   Yes [provider]  MYRBETRIQ 50 MG TB24 tablet Take 50 mg by mouth at bedtime. 10/20/20  Yes [provider]  omeprazole (PRILOSEC) 40 MG capsule Take 40  mg by mouth daily.   Yes [provider]  ondansetron (ZOFRAN) 4 MG tablet TAKE 1 TABLET (4 MG TOTAL) BY MOUTH EVERY 6 (SIX) HOURS AS NEEDED FOR NAUSEA OR VOMITING Patient taking differently: Take 4 mg by mouth every 8 (eight) hours as needed for nausea or vomiting. 05/27/20  Yes Annitta Needs, NP  rosuvastatin (CRESTOR) 5 MG tablet Take 5 mg by mouth daily.   Yes [provider]  senna (SENOKOT) 8.6 MG tablet Take 1 tablet by mouth 2 (two) times daily. As needed 12/22/15  Yes [provider]  atorvastatin (LIPITOR) 80 MG tablet TAKE 1 TABLET BY MOUTH EVERY DAY Patient not taking: Reported on 11/03/2020 03/06/19   Narda Amber K, DO  DEXILANT 60 MG capsule TAKE 1 CAPSULE BY MOUTH DAILY BEFORE BREAKFAST Patient not taking: Reported on 11/03/2020 10/21/20   Mahala Menghini, PA-C  fludrocortisone (FLORINEF) 0.1 MG tablet Take 200 mcg by mouth daily. Patient not taking: Reported on 11/03/2020 04/19/18   [provider]  nystatin (MYCOSTATIN/NYSTOP) powder Apply 1 g topically daily. Use daily after bath. Patient not taking: Reported on 11/03/2020    [provider]    Allergies as of 11/03/2020 - Review Complete 11/03/2020  Allergen Reaction Noted  . Atorvastatin Other (See Comments) 10/14/2015  . Codeine Nausea And Vomiting 01/02/2013  . Flagyl [metronidazole] Itching 10/14/2015  . Hydrocodone Nausea And Vomiting 10/14/2015  . Phenergan [promethazine hcl]  08/11/2017  . Pioglitazone Other (See Comments) 10/14/2015  . Reglan [metoclopramide]  08/11/2017  . Sulfa antibiotics Other (See Comments) 10/14/2015  . Victoza [liraglutide]  04/03/2017  . Ciprofloxacin Itching and Rash 10/14/2015    Family History  Problem Relation Age of Onset  . Cancer Mother   . Diabetes Mother   . Suicidality Father        GSW  . Diabetes Sister        3 sisters  . Heart disease Brother        MI  . Diabetes Brother        2 brothers  . Lupus Daughter   .  Immunocompromised Daughter   . Stroke Neg Hx     Social History   Socioeconomic History  . Marital status: Married    Spouse name: Laverna Peace  . Number of children: 2  . Years of education: 105  . Highest education level: Not on file  Occupational History  . Occupation: N/A  Tobacco Use  . Smoking status: Never Smoker  . Smokeless tobacco: Never Used  Vaping Use  . Vaping Use: Never used  Substance and Sexual Activity  . Alcohol use: No  . Drug use: No  . Sexual activity: Not Currently    Birth control/protection: Surgical    Comment: tubal  Other Topics Concern  . Not on file  Social History Narrative   01/15/16 Patient lives with daughter, Joseph Art   Caffeine Use: occasionally   Social Determinants of Health   Financial Resource Strain:  Not on file  Food Insecurity: Not on file  Transportation Needs: Not on file  Physical Activity: Not on file  Stress: Not on file  Social Connections: Not on file  Intimate Partner Violence: Not on file    Review of Systems: See HPI, otherwise negative ROS  Physical Exam: BP 138/86   Pulse 91   Temp (!) 96.9 F (36.1 C) (Temporal)   Ht 5\' 3"  (1.6 m)   Wt 158 lb (71.7 kg) Comment: per nursing home record  BMI 27.99 kg/m  General:   Wheelchair-bound.  Not communicative to.  Shakes her head yes and no to answers. Neck:  Supple; no masses or thyromegaly. No significant cervical adenopathy. Lungs:  Clear throughout to auscultation.   No wheezes, crackles, or rhonchi. No acute distress. Heart:  Regular rate and rhythm; no murmurs, clicks, rubs,  or gallops. Abdomen: Non-distended, normal bowel sounds.  Soft and nontender without appreciable mass or hepatosplenomegaly.  Pulses:  Normal pulses noted. Extremities:  Without clubbing or edema. Rectal:  Large amount of soft Lonzo stool in rectal vault; no mass;  hemocult negative.  Impression/Plan: Pleasant 70 year old lady with multiple comorbidities including recent CVAs now on dual  antiplatelet therapy.  Patient with vague chronic abdominal pain the setting of significant poorly controlled constipation.  She has oropharyngeal dysfunction as documented on swallowing evaluation.  Prior EGD demonstrated a patent tubular esophagus.  Empiric bougie dilation provided only limited improvement in dysphagia symptoms.  She did better on Dexilant 60 mg daily then omeprazole more recently.  No strong indication for upper endoscopy at this time.  Moreover, she is not a candidate for endoscopic evaluation.  Recommendations:  Trial of Trulance 3 mg daily - samples provided; Rx for Trulance 3 mg daily (disp 30 wit 11 refills - try samples first (okay to crush up tablets and a small amount of applesauce-give each morning)  Monitor for bowel function  Continue omeprazole 40 mg daily for now  Barium pill esophagram and upper GI series.  Vague esophageal dysphagia, early satiety, nausea and vomiting -rule out esophageal/gastric outlet obstruction.  Further recommendations to follow.  Further evaluation may be needed.     Notice: This dictation was prepared with Dragon dictation along with smaller phrase technology. Any transcriptional errors that result from this process are unintentional and may not be corrected upon review.

## 2020-11-05 ENCOUNTER — Ambulatory Visit (HOSPITAL_COMMUNITY)
Admission: RE | Admit: 2020-11-05 | Discharge: 2020-11-05 | Disposition: A | Discharge status: Home / Self Care | Payer: No Typology Code available for payment source | Source: Ambulatory Visit | Attending: Internal Medicine | Admitting: Internal Medicine

## 2020-11-05 ENCOUNTER — Other Ambulatory Visit: Payer: Self-pay | Admitting: Internal Medicine

## 2020-11-05 ENCOUNTER — Ambulatory Visit (HOSPITAL_COMMUNITY): Admission: RE | Admit: 2020-11-05 | Payer: No Typology Code available for payment source | Source: Ambulatory Visit

## 2020-11-05 DIAGNOSIS — R1310 Dysphagia, unspecified: Secondary | ICD-10-CM

## 2020-11-05 DIAGNOSIS — R1110 Vomiting, unspecified: Secondary | ICD-10-CM | POA: Diagnosis not present

## 2020-11-09 DIAGNOSIS — R410 Disorientation, unspecified: Secondary | ICD-10-CM | POA: Diagnosis not present

## 2020-11-09 DIAGNOSIS — E039 Hypothyroidism, unspecified: Secondary | ICD-10-CM | POA: Diagnosis not present

## 2020-11-09 DIAGNOSIS — Z7401 Bed confinement status: Secondary | ICD-10-CM | POA: Diagnosis not present

## 2020-11-09 DIAGNOSIS — R112 Nausea with vomiting, unspecified: Secondary | ICD-10-CM | POA: Diagnosis not present

## 2020-11-09 DIAGNOSIS — I6350 Cerebral infarction due to unspecified occlusion or stenosis of unspecified cerebral artery: Secondary | ICD-10-CM | POA: Diagnosis not present

## 2020-11-09 DIAGNOSIS — N179 Acute kidney failure, unspecified: Secondary | ICD-10-CM | POA: Diagnosis not present

## 2020-11-09 DIAGNOSIS — N3000 Acute cystitis without hematuria: Secondary | ICD-10-CM | POA: Diagnosis not present

## 2020-11-09 DIAGNOSIS — K5900 Constipation, unspecified: Secondary | ICD-10-CM | POA: Diagnosis not present

## 2020-11-09 DIAGNOSIS — Z66 Do not resuscitate: Secondary | ICD-10-CM | POA: Diagnosis not present

## 2020-11-09 DIAGNOSIS — R1110 Vomiting, unspecified: Secondary | ICD-10-CM | POA: Diagnosis not present

## 2020-11-09 DIAGNOSIS — R531 Weakness: Secondary | ICD-10-CM | POA: Diagnosis not present

## 2020-11-09 DIAGNOSIS — F32A Depression, unspecified: Secondary | ICD-10-CM | POA: Diagnosis not present

## 2020-11-09 DIAGNOSIS — R6889 Other general symptoms and signs: Secondary | ICD-10-CM | POA: Diagnosis not present

## 2020-11-09 DIAGNOSIS — K5641 Fecal impaction: Secondary | ICD-10-CM | POA: Diagnosis not present

## 2020-11-09 DIAGNOSIS — Z87440 Personal history of urinary (tract) infections: Secondary | ICD-10-CM | POA: Diagnosis not present

## 2020-11-09 DIAGNOSIS — Z79899 Other long term (current) drug therapy: Secondary | ICD-10-CM | POA: Diagnosis not present

## 2020-11-09 DIAGNOSIS — M2578 Osteophyte, vertebrae: Secondary | ICD-10-CM | POA: Diagnosis not present

## 2020-11-09 DIAGNOSIS — M6281 Muscle weakness (generalized): Secondary | ICD-10-CM | POA: Diagnosis not present

## 2020-11-09 DIAGNOSIS — R110 Nausea: Secondary | ICD-10-CM | POA: Diagnosis not present

## 2020-11-09 DIAGNOSIS — E1169 Type 2 diabetes mellitus with other specified complication: Secondary | ICD-10-CM | POA: Diagnosis not present

## 2020-11-09 DIAGNOSIS — N289 Disorder of kidney and ureter, unspecified: Secondary | ICD-10-CM | POA: Diagnosis not present

## 2020-11-09 DIAGNOSIS — R404 Transient alteration of awareness: Secondary | ICD-10-CM | POA: Diagnosis not present

## 2020-11-09 DIAGNOSIS — R42 Dizziness and giddiness: Secondary | ICD-10-CM | POA: Diagnosis not present

## 2020-11-09 DIAGNOSIS — Z882 Allergy status to sulfonamides status: Secondary | ICD-10-CM | POA: Diagnosis not present

## 2020-11-09 DIAGNOSIS — I69351 Hemiplegia and hemiparesis following cerebral infarction affecting right dominant side: Secondary | ICD-10-CM | POA: Diagnosis not present

## 2020-11-09 DIAGNOSIS — Z7902 Long term (current) use of antithrombotics/antiplatelets: Secondary | ICD-10-CM | POA: Diagnosis not present

## 2020-11-09 DIAGNOSIS — E875 Hyperkalemia: Secondary | ICD-10-CM | POA: Diagnosis not present

## 2020-11-09 DIAGNOSIS — N390 Urinary tract infection, site not specified: Secondary | ICD-10-CM | POA: Diagnosis not present

## 2020-11-09 DIAGNOSIS — D1779 Benign lipomatous neoplasm of other sites: Secondary | ICD-10-CM | POA: Diagnosis not present

## 2020-11-09 DIAGNOSIS — Z20822 Contact with and (suspected) exposure to covid-19: Secondary | ICD-10-CM | POA: Diagnosis not present

## 2020-11-09 DIAGNOSIS — R1111 Vomiting without nausea: Secondary | ICD-10-CM | POA: Diagnosis not present

## 2020-11-09 DIAGNOSIS — E785 Hyperlipidemia, unspecified: Secondary | ICD-10-CM | POA: Diagnosis not present

## 2020-11-09 DIAGNOSIS — I10 Essential (primary) hypertension: Secondary | ICD-10-CM | POA: Diagnosis not present

## 2020-11-09 DIAGNOSIS — E860 Dehydration: Secondary | ICD-10-CM | POA: Diagnosis not present

## 2020-11-09 DIAGNOSIS — E119 Type 2 diabetes mellitus without complications: Secondary | ICD-10-CM | POA: Diagnosis not present

## 2020-11-09 DIAGNOSIS — G9341 Metabolic encephalopathy: Secondary | ICD-10-CM | POA: Diagnosis not present

## 2020-11-09 DIAGNOSIS — Z7982 Long term (current) use of aspirin: Secondary | ICD-10-CM | POA: Diagnosis not present

## 2020-11-09 DIAGNOSIS — Z7984 Long term (current) use of oral hypoglycemic drugs: Secondary | ICD-10-CM | POA: Diagnosis not present

## 2020-11-09 DIAGNOSIS — Z743 Need for continuous supervision: Secondary | ICD-10-CM | POA: Diagnosis not present

## 2020-11-09 DIAGNOSIS — Z87891 Personal history of nicotine dependence: Secondary | ICD-10-CM | POA: Diagnosis not present

## 2020-11-09 DIAGNOSIS — R2689 Other abnormalities of gait and mobility: Secondary | ICD-10-CM | POA: Diagnosis not present

## 2020-11-10 ENCOUNTER — Other Ambulatory Visit: Payer: Self-pay

## 2020-11-10 DIAGNOSIS — E1143 Type 2 diabetes mellitus with diabetic autonomic (poly)neuropathy: Secondary | ICD-10-CM

## 2020-11-10 DIAGNOSIS — K3184 Gastroparesis: Secondary | ICD-10-CM

## 2020-11-11 DIAGNOSIS — Z299 Encounter for prophylactic measures, unspecified: Secondary | ICD-10-CM | POA: Diagnosis not present

## 2020-11-11 DIAGNOSIS — M6281 Muscle weakness (generalized): Secondary | ICD-10-CM | POA: Diagnosis not present

## 2020-11-11 DIAGNOSIS — F32A Depression, unspecified: Secondary | ICD-10-CM | POA: Diagnosis not present

## 2020-11-11 DIAGNOSIS — R42 Dizziness and giddiness: Secondary | ICD-10-CM | POA: Diagnosis not present

## 2020-11-11 DIAGNOSIS — Z20822 Contact with and (suspected) exposure to covid-19: Secondary | ICD-10-CM | POA: Diagnosis not present

## 2020-11-11 DIAGNOSIS — E1165 Type 2 diabetes mellitus with hyperglycemia: Secondary | ICD-10-CM | POA: Diagnosis not present

## 2020-11-11 DIAGNOSIS — I10 Essential (primary) hypertension: Secondary | ICD-10-CM | POA: Diagnosis not present

## 2020-11-11 DIAGNOSIS — K219 Gastro-esophageal reflux disease without esophagitis: Secondary | ICD-10-CM | POA: Diagnosis not present

## 2020-11-11 DIAGNOSIS — R2689 Other abnormalities of gait and mobility: Secondary | ICD-10-CM | POA: Diagnosis not present

## 2020-11-11 DIAGNOSIS — D6869 Other thrombophilia: Secondary | ICD-10-CM | POA: Diagnosis not present

## 2020-11-11 DIAGNOSIS — R1110 Vomiting, unspecified: Secondary | ICD-10-CM | POA: Diagnosis not present

## 2020-11-11 DIAGNOSIS — G20 Parkinson's disease: Secondary | ICD-10-CM | POA: Diagnosis not present

## 2020-11-11 DIAGNOSIS — K5900 Constipation, unspecified: Secondary | ICD-10-CM | POA: Diagnosis not present

## 2020-11-11 DIAGNOSIS — Z7401 Bed confinement status: Secondary | ICD-10-CM | POA: Diagnosis not present

## 2020-11-11 DIAGNOSIS — N390 Urinary tract infection, site not specified: Secondary | ICD-10-CM | POA: Diagnosis not present

## 2020-11-11 DIAGNOSIS — R6889 Other general symptoms and signs: Secondary | ICD-10-CM | POA: Diagnosis not present

## 2020-11-11 DIAGNOSIS — R110 Nausea: Secondary | ICD-10-CM | POA: Diagnosis not present

## 2020-11-11 DIAGNOSIS — E1169 Type 2 diabetes mellitus with other specified complication: Secondary | ICD-10-CM | POA: Diagnosis not present

## 2020-11-11 DIAGNOSIS — I700 Atherosclerosis of aorta: Secondary | ICD-10-CM | POA: Diagnosis not present

## 2020-11-11 DIAGNOSIS — N3000 Acute cystitis without hematuria: Secondary | ICD-10-CM | POA: Diagnosis not present

## 2020-11-11 DIAGNOSIS — I6350 Cerebral infarction due to unspecified occlusion or stenosis of unspecified cerebral artery: Secondary | ICD-10-CM | POA: Diagnosis not present

## 2020-11-11 DIAGNOSIS — G9341 Metabolic encephalopathy: Secondary | ICD-10-CM | POA: Diagnosis not present

## 2020-11-12 DIAGNOSIS — N390 Urinary tract infection, site not specified: Secondary | ICD-10-CM | POA: Diagnosis not present

## 2020-11-12 DIAGNOSIS — E1165 Type 2 diabetes mellitus with hyperglycemia: Secondary | ICD-10-CM | POA: Diagnosis not present

## 2020-11-12 DIAGNOSIS — G9341 Metabolic encephalopathy: Secondary | ICD-10-CM | POA: Diagnosis not present

## 2020-11-12 DIAGNOSIS — K219 Gastro-esophageal reflux disease without esophagitis: Secondary | ICD-10-CM | POA: Diagnosis not present

## 2020-11-12 NOTE — Progress Notes (Signed)
Approached by Coralyn Helling requesting prescription for Dexilant 60 mg daily to be sent to patient's nursing home, [facility] facility.  See documentation under DG esophagus.  Dr. [name] wants patient back on Dexilant 60 mg daily.  He also advised for 68-month follow-up visit which has not been scheduled.   Patient discharged to [facility] facility yesterday. Discharge summary states that she is on BOTH Dexilant and omeprazole. The omeprazole needs to be stopped. Please contact nursing facility about this.

## 2020-11-16 DIAGNOSIS — Z20822 Contact with and (suspected) exposure to covid-19: Secondary | ICD-10-CM | POA: Diagnosis not present

## 2020-11-17 NOTE — Progress Notes (Signed)
Spoke with the pt's niece/facility pt only takes Dexilant. Has been off Omeprazole awhile and also to schedule a 3 month followup.

## 2020-11-25 NOTE — Progress Notes (Addendum)
Noted. Please make the 3 month follow up with Dr. [name].

## 2020-11-26 DIAGNOSIS — K219 Gastro-esophageal reflux disease without esophagitis: Secondary | ICD-10-CM | POA: Diagnosis not present

## 2020-11-26 DIAGNOSIS — G20 Parkinson's disease: Secondary | ICD-10-CM | POA: Diagnosis not present

## 2020-11-26 DIAGNOSIS — I700 Atherosclerosis of aorta: Secondary | ICD-10-CM | POA: Diagnosis not present

## 2020-11-26 DIAGNOSIS — D6869 Other thrombophilia: Secondary | ICD-10-CM | POA: Diagnosis not present

## 2020-11-26 DIAGNOSIS — I10 Essential (primary) hypertension: Secondary | ICD-10-CM | POA: Diagnosis not present

## 2020-11-26 DIAGNOSIS — Z299 Encounter for prophylactic measures, unspecified: Secondary | ICD-10-CM | POA: Diagnosis not present

## 2020-11-26 NOTE — Progress Notes (Signed)
Noted  

## 2020-12-15 ENCOUNTER — Encounter: Payer: Self-pay | Admitting: Internal Medicine

## 2020-12-30 DIAGNOSIS — Z23 Encounter for immunization: Secondary | ICD-10-CM | POA: Diagnosis not present

## 2021-01-18 ENCOUNTER — Ambulatory Visit (INDEPENDENT_AMBULATORY_CARE_PROVIDER_SITE_OTHER): Payer: Medicare Other | Admitting: Urology

## 2021-01-18 ENCOUNTER — Other Ambulatory Visit: Payer: Self-pay

## 2021-01-18 ENCOUNTER — Encounter: Payer: Self-pay | Admitting: Urology

## 2021-01-18 VITALS — BP 108/71 | HR 97

## 2021-01-18 DIAGNOSIS — R339 Retention of urine, unspecified: Secondary | ICD-10-CM | POA: Diagnosis not present

## 2021-01-18 MED ORDER — NITROFURANTOIN MACROCRYSTAL 50 MG PO CAPS: 50.0000 mg | ORAL_CAPSULE | Freq: Every day | ORAL | 11 refills | Status: DC

## 2021-01-18 NOTE — Progress Notes (Signed)
Urological Symptom Review  Patient is experiencing the following symptoms: Burning/pain with urination Recurrent UTI  Review of Systems  Gastrointestinal (upper)  : Nausea Vomiting  Gastrointestinal (lower) : Constipation  Constitutional : Weight loss  Skin: Negative for skin symptoms  Eyes: Negative for eye symptoms  Ear/Nose/Throat : Negative for Ear/Nose/Throat symptoms  Hematologic/Lymphatic: Negative for Hematologic/Lymphatic symptoms  Cardiovascular : Negative for cardiovascular symptoms  Respiratory : Negative for respiratory symptoms  Endocrine: Negative for endocrine symptoms  Musculoskeletal: Negative for musculoskeletal symptoms  Neurological: Headaches  Psychologic: Negative for psychiatric symptoms

## 2021-01-18 NOTE — Progress Notes (Signed)
01/18/2021 11:28 AM   Netta Corrigan [birth year] [id number]  Referring provider: Monico Blitz, MD [address] [place],  [place] [address]  Urinary retention   HPI: Ms Schuller is a [phone] here for evaluation of urinary retention. 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 She was having incontinence since March 2022 and then 1 month ago she developed urinary retention and had a foley placed. She was diagnosed with 4 UTIs since March 2022. She has poor mobility and is confined to a chair/bed. No hematuria. No pelvic. Pain. She has issues with constipation.     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 Britta Mccreedy: hiatal hernia, gastritis   ESOPHAGOGASTRODUODENOSCOPY (EGD) WITH PROPOFOL N/A 09/15/2017    normal esophagus s/p dilation, abnormal mucosa congested appearing, with some snake skinning appearance, patent pylorus, normal duodenum. Reactive gastropathy, hyperplastic polyps.    Eye Surgrey Bilateral    4-5 times for macular degeneration   HAND SURGERY Bilateral 2001   MALONEY DILATION N/A 09/15/2017   Procedure:  Venia Minks DILATION;  Surgeon: Daneil Dolin, MD;  [facility]: [facility];  Service: Endoscopy;  Laterality: N/A;   SHOULDER SURGERY Left    TUBAL LIGATION      Home Medications:  Allergies as of 01/18/2021       Reactions   Atorvastatin Other (See Comments)   intolerance   Codeine Nausea And Vomiting   Flagyl [metronidazole] Itching   Hydrocodone Nausea And Vomiting   Phenergan [promethazine Hcl]    Pioglitazone Other (See Comments)   Weight gain, edema   Reglan [metoclopramide]    Sulfa Antibiotics Other (See Comments)   Intolerance, vomiting   Victoza [liraglutide]    Upset stomach   Ciprofloxacin Itching, Rash        Medication List        Accurate as of January 18, 2021 11:28 AM. If you have any questions, ask your nurse or doctor.          STOP taking these medications    atorvastatin 80 MG tablet Commonly known as: LIPITOR Stopped by: Nicolette Bang, MD   meclizine 12.5 MG tablet Commonly known as: ANTIVERT Stopped by: Nicolette Bang, MD   nystatin powder Commonly known as: MYCOSTATIN/NYSTOP Stopped by: Nicolette Bang, MD   omeprazole 40 MG capsule Commonly known as: PRILOSEC Stopped by: Nicolette Bang, MD       TAKE these medications    amLODipine  10 MG tablet Commonly known as: NORVASC Take 10 mg by mouth daily.   aspirin 325 MG tablet Take 325 mg by mouth daily. What changed: Another medication with the same name was removed. Continue taking this medication, and follow the directions you see here. Changed by: Nicolette Bang, MD   carbamazepine 100 MG 12 hr tablet Commonly known as: TEGRETOL XR Take 300 mg by mouth 2 (two) times daily.   clopidogrel 75 MG tablet Commonly known as: PLAVIX Take 75 mg by mouth daily.   Dexilant 60 MG capsule Generic drug: dexlansoprazole TAKE 1 CAPSULE BY MOUTH DAILY BEFORE BREAKFAST   fludrocortisone 0.1 MG tablet Commonly known as: FLORINEF Take 200 mcg by mouth daily.   FLUoxetine 40  MG capsule Commonly known as: PROZAC Take 40 mg by mouth daily.   insulin degludec 100 UNIT/ML FlexTouch Pen Commonly known as: TRESIBA Inject 20 Units into the skin daily.   levothyroxine 75 MCG tablet Commonly known as: SYNTHROID Take 75 mcg by mouth daily before breakfast.   Myrbetriq 50 MG Tb24 tablet Generic drug: mirabegron ER Take 50 mg by mouth at bedtime.   ondansetron 4 MG tablet Commonly known as: ZOFRAN TAKE 1 TABLET (4 MG TOTAL) BY MOUTH EVERY 6 (SIX) HOURS AS NEEDED FOR NAUSEA OR VOMITING What changed: when to take this   rosuvastatin 5 MG tablet Commonly known as: CRESTOR Take 5 mg by mouth daily.   senna 8.6 MG tablet Commonly known as: SENOKOT Take 1 tablet by mouth 2 (two) times daily. As needed   Trulance 3 MG Tabs Generic drug: Plecanatide Take 1 tablet by mouth daily.        Allergies:  Allergies  Allergen Reactions   Atorvastatin Other (See Comments)    intolerance   Codeine Nausea And Vomiting   Flagyl [Metronidazole] Itching   Hydrocodone Nausea And Vomiting   Phenergan [Promethazine Hcl]    Pioglitazone Other (See Comments)    Weight gain, edema   Reglan [Metoclopramide]    Sulfa Antibiotics Other (See Comments)    Intolerance, vomiting    Victoza [Liraglutide]     Upset stomach   Ciprofloxacin Itching and Rash    Family History: Family History  Problem Relation Age of Onset   Cancer Mother    Diabetes Mother    Suicidality Father        GSW   Diabetes Sister        3 sisters   Heart disease Brother        MI   Diabetes Brother        2 brothers   Lupus Daughter    Immunocompromised Daughter    Stroke Neg Hx     Social History:  reports that she has never smoked. She has never used smokeless tobacco. She reports that she does not drink alcohol and does not use drugs.  ROS: All other review of systems were reviewed and are negative except what is noted above in HPI  Physical Exam: BP 108/71   Pulse 97    Constitutional:  Alert and oriented, No acute distress. HEENT: [place] AT, moist mucus membranes.  Trachea midline, no masses. Cardiovascular: No clubbing, cyanosis, or edema. Respiratory: Normal respiratory effort, no increased work of breathing. GI: Abdomen is soft, nontender, nondistended, no abdominal masses GU: No CVA tenderness.  Lymph: No cervical or inguinal lymphadenopathy. Skin: No rashes, bruises or suspicious lesions. Neurologic: Grossly intact, no focal deficits, moving all 4 extremities. Psychiatric: Normal mood and affect.  Laboratory Data: Lab Results  Component Value Date   WBC 9.6 09/14/2017   HGB 12.8 09/14/2017   HCT 37.7 09/14/2017   MCV 86.5 09/14/2017   PLT 223 09/14/2017    Lab Results  Component Value Date   CREATININE 0.62 09/14/2017    No results found for: PSA  No results found for: TESTOSTERONE  Lab Results  Component Value Date   HGBA1C 6.0 (H) 07/21/2017    Urinalysis    Component Value Date/Time   COLORURINE YELLOW 07/20/2017 2215   [place] 07/20/2017 2215   LABSPEC <1.005 (L) 07/20/2017 2215   PHURINE 6.5 07/20/2017 2215   GLUCOSEU NEGATIVE 07/20/2017 2215   HGBUR NEGATIVE 07/20/2017 2215   BILIRUBINUR NEGATIVE 07/20/2017 2215   KETONESUR TRACE (A) 07/20/2017 2215   PROTEINUR NEGATIVE 07/20/2017 2215   NITRITE NEGATIVE 07/20/2017 2215   LEUKOCYTESUR LARGE (A) 07/20/2017 2215    Lab Results  Component Value Date   BACTERIA MANY (A) 07/20/2017    Pertinent Imaging:  Results for orders placed during the hospital encounter of 10/14/15  DG Abd 1 View  Narrative CLINICAL DATA:  Constipation.  EXAM: ABDOMEN - 1 VIEW  COMPARISON:  None.  FINDINGS: Moderate stool burden throughout the colon. Nonobstructive bowel gas pattern. No free air organomegaly. Prior cholecystectomy.  IMPRESSION: Moderate stool burden.  No acute findings.   Electronically Signed By: Rolm Baptise M.D. On: 10/19/2015 18:13  No  results found for this or any previous visit.  No results found for this or any previous visit.  No results found for this or any previous visit.  No results found for this or any previous visit.  No results found for this or any previous visit.  No results found for this or any previous visit.  No results found for this or any previous visit.   Assessment & Plan:    1. Urinary retention -We discussed the management including chronic indwelling foley, CIC and SP tube. After discussing the options the patient elects for indwelling foley. The foley should be changed monthly at her facility  2. Chronic cystitis -We will start macrobid '50mg'$  qhs   No follow-ups on file.  Nicolette Bang, MD  [facility] [place]

## 2021-01-18 NOTE — Patient Instructions (Signed)
Acute Urinary Retention, Female  Acute urinary retention is when a person cannot pee (urinate) at all, or can only pee a little. This can come on all of a sudden. If it isnot treated, it can lead to kidney problems or other serious problems. What are the causes? A problem with the tube that drains the bladder (urethra). Problems with the nerves in the bladder. The organs in the area between your hip bones (pelvis) slipping out of place (prolapse). Tumors. The birth of a baby through the vagina. An infection. Having trouble pooping (constipation). Certain medicines. What increases the risk? Women over age 58 are more at risk. Other conditions also can increase risk. These include: Diseases, such as multiple sclerosis. Injury to the spinal cord. Diabetes. A condition that affects the way the brain works, such as dementia. Holding back urine due to trauma or because you do not want to use the bathroom. History of not being able to pee or peeing too little. Having had surgery in the area between your hip bones. What are the signs or symptoms? Trouble peeing. Pain in the lower belly. How is this treated? Treatment for this condition may include: Medicines. Placing a thin, germ-free tube (catheter) into the bladder to drain pee out of the body. Therapy to treat mental health conditions. Treatment for conditions that may cause this. If needed, you may be treated in the hospital for kidney problems or to manageother problems. Follow these instructions at home: Medicines Take over-the-counter and prescription medicines only as told by your doctor. Ask your doctor what medicines you should stay away from. If you were given an antibiotic medicine, take it as told by your doctor. Do not stop taking it even if you start to feel better. General instructions Do not smoke or use any products that contain nicotine or tobacco. If you need help quitting, ask your doctor. Drink enough fluid to keep  your pee pale yellow. If you were sent home with a tube that drains the bladder, take care of it as told by your doctor. Watch for changes in your symptoms. Tell your doctor about them. If told, keep track of changes in your blood pressure at home. Tell your doctor about them. Keep all follow-up visits. Contact a doctor if: You have spasms in your bladder that you cannot stop. You leak pee when you have spasms. Get help right away if: You have chills or a fever. You have blood in your pee. You have a tube that drains pee from the bladder and these things happen: The tube stops draining pee. The tube falls out. Summary Acute urinary retention is when you cannot pee at all or you pee too little. If this is not treated, it can cause kidney problems or other serious problems. If you were sent home with a tube (catheter) that drains pee from the bladder, take care of it as told by your doctor. Watch for changes in your symptoms. Tell your doctor about them. This information is not intended to replace advice given to you by your health care provider. Make sure you discuss any questions you have with your healthcare provider. Document Revised: 02/19/2020 Document Reviewed: 02/19/2020 Elsevier Patient Education  2022 [employer].

## 2021-01-29 ENCOUNTER — Ambulatory Visit (INDEPENDENT_AMBULATORY_CARE_PROVIDER_SITE_OTHER): Payer: Medicare Other | Admitting: Internal Medicine

## 2021-01-29 ENCOUNTER — Encounter: Payer: Self-pay | Admitting: Internal Medicine

## 2021-01-29 ENCOUNTER — Other Ambulatory Visit: Payer: Self-pay

## 2021-01-29 VITALS — BP 102/62 | Temp 97.9°F

## 2021-01-29 DIAGNOSIS — R1312 Dysphagia, oropharyngeal phase: Secondary | ICD-10-CM | POA: Diagnosis not present

## 2021-01-29 DIAGNOSIS — E1143 Type 2 diabetes mellitus with diabetic autonomic (poly)neuropathy: Secondary | ICD-10-CM | POA: Diagnosis not present

## 2021-01-29 DIAGNOSIS — R110 Nausea: Secondary | ICD-10-CM

## 2021-01-29 DIAGNOSIS — K3184 Gastroparesis: Secondary | ICD-10-CM | POA: Diagnosis not present

## 2021-01-29 DIAGNOSIS — R1110 Vomiting, unspecified: Secondary | ICD-10-CM

## 2021-01-29 MED ORDER — ONDANSETRON 4 MG PO TBDP: 4.0000 mg | ORAL_TABLET | Freq: Four times a day (QID) | ORAL | 3 refills | Status: AC

## 2021-01-29 NOTE — Patient Instructions (Addendum)
It was good to see you and your daughter today!  Will discontinue oral Zofran  New prescription for Zofran orally disintegrating tablets  -  4 mg under the tongue 30 minutes before meals 3 times a day and at bedtime (Dispense 120 with 3 refills)  Continue Dexilant 60 mg daily  Gastroparesis diet.  5-6 small meals/snacks daily.  May use Zofran before 3 of them every day  I found a fecal impaction today.  Staff at the nursing home is to give warm milk and molasses enemas up to 3 times to get relief.  Continue Trulance 3 mg each morning for constipation  Patient may or may not benefit from a J-tube as discussed.  Limitations of a feeding tube reviewed.  A G-tube is not a good idea because her stomach already does not empty properly.  Also feeding tubes will not decrease the risk of aspiration.  A feeding tube may be an option if needed, however.  We can consider referral to a general surgeon for consultation in the near future  Office visit with [place] here in 4 to 6 weeks.

## 2021-01-29 NOTE — Progress Notes (Signed)
Primary Care Physician:  Monico Blitz, MD Primary Gastroenterologist:  Dr. [name]  Pre-Procedure History & Physical: HPI:  Jenna Maldonado is a 70 y.o. female here for further evaluation of diminished oral intake, nausea and vomiting.  Patient with known gastroparesis.  Diabetes mellitus.  History of tardive dyskinesia with Reglan.  Patient may have lost as much as 30 pounds so far this year based on putting weights together from various places.  Diminished bowel function and in spite of being on Trulance 3 mg daily (there is questions whether or not she is getting this medication.  She frequently vomits.  Dexilant 60 mg daily for GERD.  On Zofran orally as needed nausea.  There is a real question as to whether she has been getting her meds effectively with N/V.  She has significant oropharyngeal dysfunction per speech pathology.  She does have a history of recurrent aspiration.  She is acccompanied by her daughter today who is very concerned.  She wants to discuss a feeding tube.  Patient takes clopidogrel daily.  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 Britta Mccreedy: hiatal hernia, gastritis   ESOPHAGOGASTRODUODENOSCOPY (EGD) WITH PROPOFOL N/A 09/15/2017    normal esophagus s/p dilation, abnormal mucosa congested appearing, with some snake skinning appearance, patent pylorus, normal duodenum. Reactive gastropathy, hyperplastic polyps.    Eye Surgrey Bilateral    4-5 times for macular degeneration   HAND SURGERY Bilateral 2001   MALONEY DILATION N/A 09/15/2017   Procedure: Venia Minks DILATION;  Surgeon: Daneil Dolin, MD;  [facility]: [facility];  Service: Endoscopy;  Laterality: N/A;   SHOULDER SURGERY Left    TUBAL LIGATION      Prior to Admission medications   Medication Sig Start Date End Date Taking? Authorizing Provider  acetaminophen (TYLENOL) 325 MG tablet Take 650 mg by mouth every 6 (six) hours as needed.   Yes [provider]  amLODipine (NORVASC) 10 MG tablet Take 10 mg by mouth daily.   Yes [provider]  carbamazepine (TEGRETOL XR) 100 MG 12 hr tablet Take 300 mg by mouth 2 (two) times daily.    Yes [provider]  clopidogrel (PLAVIX) 75 MG tablet Take 75 mg by mouth daily.   Yes [provider]  DEXILANT 60 MG capsule TAKE 1 CAPSULE BY MOUTH DAILY BEFORE BREAKFAST 10/21/20  Yes Mahala Menghini, PA-C  FLUoxetine (PROZAC) 40 MG capsule Take 40 mg by mouth daily.  12/30/12  Yes  [provider]  insulin glargine (LANTUS) 100 UNIT/ML injection Inject 20 Units into the skin in the morning.   Yes [provider]  levothyroxine (SYNTHROID) 75 MCG tablet Take 75 mcg by mouth daily before breakfast.   Yes [provider]  MYRBETRIQ 50 MG TB24 tablet Take 50 mg by mouth at bedtime. 10/20/20  Yes [provider]  ondansetron (ZOFRAN) 4 MG tablet TAKE 1 TABLET (4 MG TOTAL) BY MOUTH EVERY 6 (SIX) HOURS AS NEEDED FOR NAUSEA OR VOMITING Patient taking differently: Take 4 mg by mouth every 8 (eight) hours as needed for nausea or vomiting. 05/27/20  Yes Annitta Needs,  NP  Plecanatide (TRULANCE) 3 MG TABS Take 1 tablet by mouth daily. 11/03/20  Yes Pema Thomure, Cristopher Estimable, MD  rosuvastatin (CRESTOR) 5 MG tablet Take 5 mg by mouth daily.   Yes [provider]  senna (SENOKOT) 8.6 MG tablet Take 1 tablet by mouth 2 (two) times daily. As needed 12/22/15  Yes [provider]    Allergies as of 01/29/2021 - Review Complete 01/29/2021  Allergen Reaction Noted   Atorvastatin Other (See Comments) 10/14/2015   Codeine Nausea And Vomiting 01/02/2013   Flagyl [metronidazole] Itching 10/14/2015   Hydrocodone Nausea And Vomiting 10/14/2015   Phenergan [promethazine hcl]  08/11/2017   Pioglitazone Other (See Comments) 10/14/2015   Reglan [metoclopramide]  08/11/2017   Sulfa antibiotics Other (See Comments) 10/14/2015   Victoza [liraglutide]  04/03/2017   Ciprofloxacin Itching and Rash 10/14/2015    Family History  Problem Relation Age of Onset   Cancer Mother    Diabetes Mother    Suicidality Father        GSW   Diabetes Sister        3 sisters   Heart disease Brother        MI   Diabetes Brother        2 brothers   Lupus Daughter    Immunocompromised Daughter    Stroke Neg Hx     Social History   Socioeconomic History   Marital status: Married    Spouse name: Jimmy   Number of children: 2   Years of education: 12   Highest education level: Not on file  Occupational History   Occupation: N/A  Tobacco Use   Smoking status: Never   Smokeless tobacco: Never  Vaping Use   Vaping Use: Never used  Substance and Sexual Activity   Alcohol use: No   Drug use: No   Sexual activity: Not Currently    Birth control/protection: Surgical    Comment: tubal  Other Topics Concern   Not on file  Social History Narrative   01/15/16 Patient lives with daughter, Joseph Art   Caffeine Use: occasionally   Social Determinants of Health   Financial Resource Strain: Not on file  Food Insecurity: Not on file  Transportation Needs: Not on file  Physical  Activity: Not on file  Stress: Not on file  Social Connections: Not on file  Intimate Partner Violence: Not on file    Review of Systems: See HPI, otherwise negative ROS  Physical Exam: BP 102/62   Temp 97.9 F (36.6 C) (Temporal) weight today not obtainable as patient is unable to stand in a wheelchair. General:   Alert, left facial droop.  Modestly conversant.  Appears chronically ill but no acute distress  Neck:  Supple; no masses or thyromegaly. No significant cervical adenopathy. Lungs:  Clear throughout to auscultation.   No wheezes,  crackles, or rhonchi. No acute distress. Heart:  Regular rate and rhythm; no murmurs, clicks, rubs,  or gallops. Abdomen: Nondistended.  No succussion splash positive bowel sounds soft nontender without appreciable mass organomegaly Impression/Plan:  Rectal: No external lesions.  Patient has a large soft impaction.  Rivere stool is Hemoccult negative  Impression: 70 year old lady devastated by a CVA with significant oropharyngeal dysphagia/aspiration.  Poorly controlled diabetes mellitus with diabetic gastroparesis. Failing to thrive over the last several months with apparent weight loss secondary to repeated nausea and vomiting.  History of intolerance to Reglan due to tardive dyskinesia.  Chronic constipation poorly managed with fecal impaction found today.  She is likely not been ingesting her Trulance due to nausea and vomiting.  I had a lengthy discussion with patient and her daughter today.  It is important to get her bowels managed better along better gastroparesis management.  I briefly touched upon the fact the G-tube would not be a good idea as she already has documented have gastroparesis.  Likely would be problematic;  would still have issues with delayed gastric emptying; would not decrease the risk of aspiration.  A J-tube could be considered at some point in future.  However, would be premature to send her for general surgery consultation  at this point in time  Recommendations:  Will discontinue oral Zofran  New prescription for Zofran orally disintegrating tablets  -  4 mg under the tongue 30 minutes before meals 3 times a day and at bedtime (Dispense 120 with 3 refills)  Continue Dexilant 60 mg daily  Gastroparesis diet.  5-6 small meals/snacks daily.  May use Zofran before 3 of the meals every day  Staff at the nursing home is to give warm milk and molasses enemas up to 3 times to get relief.  Continue Trulance 3 mg each morning for constipation  Patient may or may not benefit from a J-tube as discussed.  Limitations of a feeding tube reviewed.  A G-tube is not a good idea because her stomach already does not empty properly.  Also feeding tubes will not decrease the risk of aspiration.  A feeding tube may be an option if needed, however.  We can consider referral to a general surgeon for consultation in the near future  Office visit with [place] here in 4 to 6 weeks.      Notice: This dictation was prepared with Dragon dictation along with smaller phrase technology. Any transcriptional errors that result from this process are unintentional and may not be corrected upon review.

## 2021-02-09 DIAGNOSIS — K3184 Gastroparesis: Secondary | ICD-10-CM | POA: Diagnosis not present

## 2021-02-09 DIAGNOSIS — Z299 Encounter for prophylactic measures, unspecified: Secondary | ICD-10-CM | POA: Diagnosis not present

## 2021-02-09 DIAGNOSIS — D6869 Other thrombophilia: Secondary | ICD-10-CM | POA: Diagnosis not present

## 2021-02-09 DIAGNOSIS — I10 Essential (primary) hypertension: Secondary | ICD-10-CM | POA: Diagnosis not present

## 2021-02-18 DIAGNOSIS — Z299 Encounter for prophylactic measures, unspecified: Secondary | ICD-10-CM | POA: Diagnosis not present

## 2021-02-18 DIAGNOSIS — I10 Essential (primary) hypertension: Secondary | ICD-10-CM | POA: Diagnosis not present

## 2021-02-18 DIAGNOSIS — E1151 Type 2 diabetes mellitus with diabetic peripheral angiopathy without gangrene: Secondary | ICD-10-CM | POA: Diagnosis not present

## 2021-02-20 DIAGNOSIS — R6889 Other general symptoms and signs: Secondary | ICD-10-CM | POA: Diagnosis not present

## 2021-02-23 DIAGNOSIS — Z593 Problems related to living in residential institution: Secondary | ICD-10-CM | POA: Diagnosis not present

## 2021-02-23 DIAGNOSIS — Z299 Encounter for prophylactic measures, unspecified: Secondary | ICD-10-CM | POA: Diagnosis not present

## 2021-02-23 DIAGNOSIS — R531 Weakness: Secondary | ICD-10-CM | POA: Diagnosis not present

## 2021-02-23 DIAGNOSIS — Z743 Need for continuous supervision: Secondary | ICD-10-CM | POA: Diagnosis not present

## 2021-02-23 DIAGNOSIS — Z0000 Encounter for general adult medical examination without abnormal findings: Secondary | ICD-10-CM | POA: Diagnosis not present

## 2021-02-23 DIAGNOSIS — Z7401 Bed confinement status: Secondary | ICD-10-CM | POA: Diagnosis not present

## 2021-02-23 DIAGNOSIS — Z7189 Other specified counseling: Secondary | ICD-10-CM | POA: Diagnosis not present

## 2021-02-23 DIAGNOSIS — E7800 Pure hypercholesterolemia, unspecified: Secondary | ICD-10-CM | POA: Diagnosis not present

## 2021-02-23 DIAGNOSIS — R5383 Other fatigue: Secondary | ICD-10-CM | POA: Diagnosis not present

## 2021-03-09 ENCOUNTER — Ambulatory Visit: Payer: Medicare Other | Admitting: Internal Medicine

## 2021-03-16 ENCOUNTER — Ambulatory Visit: Payer: Medicare Other | Admitting: Internal Medicine

## 2021-04-13 DEATH — deceased

## 2021-04-21 ENCOUNTER — Ambulatory Visit: Payer: Medicare Other | Admitting: Urology
